# Patient Record
Sex: Female | Born: 1965 | Race: White | Hispanic: No | Smoking: Never smoker
Health system: Southern US, Community
[De-identification: ages and names within clinical notes are randomized; demographics above are authoritative.]

## PROBLEM LIST (undated history)

## (undated) DIAGNOSIS — G43909 Migraine, unspecified, not intractable, without status migrainosus: Secondary | ICD-10-CM

## (undated) DIAGNOSIS — K5792 Diverticulitis of intestine, part unspecified, without perforation or abscess without bleeding: Secondary | ICD-10-CM

## (undated) DIAGNOSIS — I10 Essential (primary) hypertension: Secondary | ICD-10-CM

## (undated) DIAGNOSIS — N739 Female pelvic inflammatory disease, unspecified: Secondary | ICD-10-CM

## (undated) DIAGNOSIS — K219 Gastro-esophageal reflux disease without esophagitis: Secondary | ICD-10-CM

## (undated) HISTORY — DX: Diverticulitis of intestine, part unspecified, without perforation or abscess without bleeding: K57.92

## (undated) HISTORY — DX: Female pelvic inflammatory disease, unspecified: N73.9

## (undated) HISTORY — PX: CHOLECYSTECTOMY: SHX55

## (undated) HISTORY — PX: TONSILLECTOMY: SUR1361

---

## 1998-05-10 ENCOUNTER — Other Ambulatory Visit: Admission: RE | Admit: 1998-05-10 | Discharge: 1998-05-10 | Payer: Self-pay | Admitting: Obstetrics & Gynecology

## 1999-05-23 ENCOUNTER — Other Ambulatory Visit: Admission: RE | Admit: 1999-05-23 | Discharge: 1999-05-23 | Payer: Self-pay | Admitting: Obstetrics and Gynecology

## 1999-12-19 ENCOUNTER — Encounter: Payer: Self-pay | Admitting: Gastroenterology

## 1999-12-19 ENCOUNTER — Ambulatory Visit (HOSPITAL_COMMUNITY): Admission: RE | Admit: 1999-12-19 | Discharge: 1999-12-19 | Payer: Self-pay | Admitting: Gastroenterology

## 2000-06-15 ENCOUNTER — Other Ambulatory Visit: Admission: RE | Admit: 2000-06-15 | Discharge: 2000-06-15 | Payer: Self-pay | Admitting: Obstetrics and Gynecology

## 2000-11-29 ENCOUNTER — Encounter: Admission: RE | Admit: 2000-11-29 | Discharge: 2000-12-18 | Payer: Self-pay | Admitting: *Deleted

## 2001-06-26 ENCOUNTER — Other Ambulatory Visit: Admission: RE | Admit: 2001-06-26 | Discharge: 2001-06-26 | Payer: Self-pay | Admitting: Obstetrics and Gynecology

## 2003-04-30 ENCOUNTER — Other Ambulatory Visit: Admission: RE | Admit: 2003-04-30 | Discharge: 2003-04-30 | Payer: Self-pay | Admitting: Unknown Physician Specialty

## 2007-09-04 ENCOUNTER — Encounter: Admission: RE | Admit: 2007-09-04 | Discharge: 2007-09-04 | Payer: Self-pay | Admitting: Obstetrics and Gynecology

## 2007-09-11 ENCOUNTER — Encounter: Admission: RE | Admit: 2007-09-11 | Discharge: 2007-09-11 | Payer: Self-pay | Admitting: Obstetrics and Gynecology

## 2008-09-08 ENCOUNTER — Encounter: Admission: RE | Admit: 2008-09-08 | Discharge: 2008-09-08 | Payer: Self-pay | Admitting: Obstetrics and Gynecology

## 2008-09-11 ENCOUNTER — Encounter: Admission: RE | Admit: 2008-09-11 | Discharge: 2008-09-11 | Payer: Self-pay | Admitting: Obstetrics and Gynecology

## 2009-09-27 ENCOUNTER — Encounter: Admission: RE | Admit: 2009-09-27 | Discharge: 2009-09-27 | Payer: Self-pay | Admitting: Obstetrics and Gynecology

## 2010-08-25 ENCOUNTER — Other Ambulatory Visit: Payer: Self-pay | Admitting: Obstetrics and Gynecology

## 2010-08-25 DIAGNOSIS — Z1231 Encounter for screening mammogram for malignant neoplasm of breast: Secondary | ICD-10-CM

## 2010-09-29 ENCOUNTER — Ambulatory Visit: Payer: Self-pay

## 2010-09-29 ENCOUNTER — Ambulatory Visit
Admission: RE | Admit: 2010-09-29 | Discharge: 2010-09-29 | Disposition: A | Discharge status: Home / Self Care | Payer: BC Managed Care – PPO | Source: Ambulatory Visit | Attending: Obstetrics and Gynecology | Admitting: Obstetrics and Gynecology

## 2010-09-29 DIAGNOSIS — Z1231 Encounter for screening mammogram for malignant neoplasm of breast: Secondary | ICD-10-CM

## 2010-10-25 ENCOUNTER — Other Ambulatory Visit (HOSPITAL_COMMUNITY): Payer: Self-pay | Admitting: Obstetrics and Gynecology

## 2010-10-25 ENCOUNTER — Ambulatory Visit (HOSPITAL_COMMUNITY)
Admission: RE | Admit: 2010-10-25 | Discharge: 2010-10-25 | Disposition: A | Discharge status: Home / Self Care | Payer: BC Managed Care – PPO | Source: Ambulatory Visit | Attending: Obstetrics and Gynecology | Admitting: Obstetrics and Gynecology

## 2010-10-25 DIAGNOSIS — R1900 Intra-abdominal and pelvic swelling, mass and lump, unspecified site: Secondary | ICD-10-CM | POA: Insufficient documentation

## 2010-10-25 DIAGNOSIS — N9489 Other specified conditions associated with female genital organs and menstrual cycle: Secondary | ICD-10-CM | POA: Insufficient documentation

## 2010-10-25 DIAGNOSIS — N200 Calculus of kidney: Secondary | ICD-10-CM | POA: Insufficient documentation

## 2010-10-25 DIAGNOSIS — Z9089 Acquired absence of other organs: Secondary | ICD-10-CM | POA: Insufficient documentation

## 2010-10-25 DIAGNOSIS — K5732 Diverticulitis of large intestine without perforation or abscess without bleeding: Secondary | ICD-10-CM | POA: Insufficient documentation

## 2010-10-25 DIAGNOSIS — K449 Diaphragmatic hernia without obstruction or gangrene: Secondary | ICD-10-CM | POA: Insufficient documentation

## 2010-10-25 MED ORDER — IOHEXOL 300 MG/ML  SOLN
100.0000 mL | Freq: Once | INTRAMUSCULAR | Status: AC | PRN
  Administered 2010-10-25: 100 mL via INTRAVENOUS

## 2010-10-27 ENCOUNTER — Encounter: Payer: Self-pay | Admitting: Gastroenterology

## 2010-10-27 ENCOUNTER — Ambulatory Visit (INDEPENDENT_AMBULATORY_CARE_PROVIDER_SITE_OTHER): Payer: BC Managed Care – PPO | Admitting: Gastroenterology

## 2010-10-27 VITALS — BP 127/84 | HR 92 | Temp 98.5°F | Ht 65.0 in | Wt 181.4 lb

## 2010-10-27 DIAGNOSIS — K5792 Diverticulitis of intestine, part unspecified, without perforation or abscess without bleeding: Secondary | ICD-10-CM | POA: Insufficient documentation

## 2010-10-27 DIAGNOSIS — K5732 Diverticulitis of large intestine without perforation or abscess without bleeding: Secondary | ICD-10-CM

## 2010-10-27 NOTE — Assessment & Plan Note (Signed)
45 year old pleasant female with clinical signs of possible diverticulitis approximately 4 weeks ago, treated with Cipro and Flagyl, with resolution of symptoms. In interim, underwent pelvic exam, which was abnormal per pt's report. Do not have records currently. CT was ordered, which showed findings of a 4X5 cm complex cystic lesion in right adnexa, adjacent soft tissue stranding, mild wall thickening of sigmoid colon. Suspicious for diverticular abscess or possibly ovarian origin. Discussed with Dr. Tyron Russell, who favors ovarian origin, yet unable to completely exclude component of past hx diverticulitis.  I reviewed pt's history and CT findings with Dr. Jena Gauss. Pt is completely asymptomatic at this time, and she responded well to oral abx. At this time, due to the questionable origins and patient's current condition, we will observe for now with plans for an outpatient colonoscopy in approximately 4-6 weeks. In the interim, she will call us when the vaginal ultrasound is scheduled so we may review this as well. After [place] completed, we will proceed with a colonoscopy.   She was instructed to call our office immediately if any changes in her status such as fever, abdominal pain, nausea or vomiting. We will hold off on any abx at this time, as she is clinically doing quite well. The findings of the ultrasound will be reviewed when available and compared to the prior CT.  We will see her back once [place] is completed.

## 2010-10-27 NOTE — Patient Instructions (Signed)
We will see you back in our office after your ultrasound is completed by your GYN.  Please call us if you develop any abdominal pain, nausea, vomiting, fever.  We will set up a colonoscopy at your next visit.  Please see handout regarding diverticulitis.   Please call us with any concerns in the meantime.

## 2010-10-27 NOTE — Progress Notes (Signed)
Primary Care Physician:  [name], MD Primary Gastroenterologist:  Dr. Darrick Penna  Chief Complaint  Patient presents with  . Diverticulitis    HPI:   Sheila [name] is a pleasant 45 year old female who presents today as a new patient regarding recent episode of possible diverticulitis and a CT with findings suspicious for abscess. She is completely asymptomatic today. Afebrile. Without any abdominal pain, nausea or vomiting. No loss of appetite. No change in bowel habits. No melena or brbpr.  Approximately 4 weeks ago, Sheila [name] woke up with severe, diffuse abdominal pain, bloating, and felt "tight". She found it difficult to have a bowel movement. An outpatient xray was ordered, which the pt reports showed constipation. She was given Miralax, and she had good results. A few days later, she started running a fever, around 101. She was seen again, where she reports her white count was elevated. Due to recent symptoms, she was placed empirically on Cipro and Flagyl. She finished approximately 1 week ago.   In the interim of all of this, she had a GYN exam, with abnormal findings per pt. This prompted evaluation via CT, which was done July 17. Findings showed a 4X5 complex cystic lesion in right adnexa, adjacent soft tissue stranding and mild wall thickening of sigmoid colon, suspicious for diverticular abscess secondary to mild sigmoid diverticulitis. Diff dx of tubo-ovarian abscess, cystic ovarian neoplasm. No free fluid.  In the midst of all of this, she found a tick on herself. She started running a fever 5 days later and was started on doxycycline secondary to tick exposure.   She reports baseline BMs, usually goes a few times per day. No abdominal pain. No bloating. Careful about what she is eating, staying away from seeds. No brbpr or melena. No N/V. Tomatoes and popcorn have always made her feel uncomfortable, bloated.   She states her GYN will be setting up a vaginal [place] around beginning  of August.   I spoke with Dr. Tyron Russell today to discuss the CT findings. He seemed to favor ovarian process vs diverticular origin.   Past Medical History  Diagnosis Date  . Diverticulitis     Past Surgical History  Procedure Date  . Cholecystectomy   . Tonsillectomy     MEDS: Doxycycline   Allergies as of 10/27/2010 - Review Complete 10/27/2010  Allergen Reaction Noted  . Sulfa antibiotics  10/25/2010    Family History  Problem Relation Age of Onset  . Colon cancer Neg Hx     History   Social History  . Marital Status: Married    Spouse Name: N/A    Number of Children: 2  . Years of Education: N/A   Occupational History  . full-time     insurance    Social History Main Topics  . Smoking status: Never Smoker   . Smokeless tobacco: Not on file  . Alcohol Use: No  . Drug Use: No    Review of Systems: Gen: Denies any fever, chills, fatigue, weight loss, lack of appetite.  CV: Denies chest pain, heart palpitations, peripheral edema, syncope.  Resp: Denies shortness of breath at rest or with exertion. Denies wheezing or cough.  GI: Denies dysphagia or odynophagia. Denies jaundice, hematemesis, fecal incontinence. GU : Denies urinary burning, urinary frequency, urinary hesitancy MS: Denies joint pain, muscle weakness, cramps, or limitation of movement.  Derm: Denies rash, itching, dry skin Psych: Denies depression, anxiety, memory loss, and confusion Heme: Denies bruising, bleeding, and enlarged lymph nodes.  Physical Exam: BP 127/84  Pulse 92  Temp(Src) 98.5 F (36.9 C) (Temporal)  Ht 5\' 5"  (1.651 m)  Wt 181 lb 6.4 oz (82.283 kg)  BMI 30.19 kg/m2  LMP 10/14/2010 General:   Alert and oriented. Pleasant and cooperative. Well-nourished and well-developed.  Head:  Normocephalic and atraumatic. Eyes:  Without icterus, sclera clear and conjunctiva pink.  Ears:  Normal auditory acuity. Nose:  No deformity, discharge,  or lesions. Mouth:  No deformity or  lesions, oral mucosa pink.  Neck:  Supple, without mass or thyromegaly. Lungs:  Clear to auscultation bilaterally. No wheezes, rales, or rhonchi. No distress.  Heart:  S1, S2 present without murmurs appreciated.  Abdomen:  +BS, soft, non-tender and non-distended. No HSM noted. No guarding or rebound. No masses appreciated.  Rectal:  Deferred  Msk:  Symmetrical without gross deformities. Normal posture. Extremities:  Without clubbing or edema. Neurologic:  Alert and  oriented x4;  grossly normal neurologically. Skin:  Intact without significant lesions or rashes. Cervical Nodes:  No significant cervical adenopathy. Psych:  Alert and cooperative. Normal mood and affect.

## 2010-10-28 NOTE — Progress Notes (Signed)
Cc to PCP 

## 2010-11-07 ENCOUNTER — Other Ambulatory Visit (HOSPITAL_COMMUNITY): Payer: Self-pay | Admitting: Obstetrics and Gynecology

## 2010-11-07 DIAGNOSIS — R1900 Intra-abdominal and pelvic swelling, mass and lump, unspecified site: Secondary | ICD-10-CM

## 2010-11-13 ENCOUNTER — Other Ambulatory Visit (HOSPITAL_COMMUNITY): Payer: Self-pay | Admitting: Obstetrics and Gynecology

## 2010-11-13 DIAGNOSIS — R1900 Intra-abdominal and pelvic swelling, mass and lump, unspecified site: Secondary | ICD-10-CM

## 2010-11-14 ENCOUNTER — Ambulatory Visit (HOSPITAL_COMMUNITY): Payer: BC Managed Care – PPO

## 2010-11-14 ENCOUNTER — Ambulatory Visit (HOSPITAL_COMMUNITY)
Admission: RE | Admit: 2010-11-14 | Discharge: 2010-11-14 | Disposition: A | Discharge status: Home / Self Care | Payer: BC Managed Care – PPO | Source: Ambulatory Visit | Attending: Obstetrics and Gynecology | Admitting: Obstetrics and Gynecology

## 2010-11-14 ENCOUNTER — Telehealth: Payer: Self-pay | Admitting: Gastroenterology

## 2010-11-14 DIAGNOSIS — N9489 Other specified conditions associated with female genital organs and menstrual cycle: Secondary | ICD-10-CM | POA: Insufficient documentation

## 2010-11-14 DIAGNOSIS — R1900 Intra-abdominal and pelvic swelling, mass and lump, unspecified site: Secondary | ICD-10-CM

## 2010-11-14 DIAGNOSIS — D251 Intramural leiomyoma of uterus: Secondary | ICD-10-CM | POA: Insufficient documentation

## 2010-11-14 DIAGNOSIS — D252 Subserosal leiomyoma of uterus: Secondary | ICD-10-CM | POA: Insufficient documentation

## 2010-11-14 DIAGNOSIS — N83209 Unspecified ovarian cyst, unspecified side: Secondary | ICD-10-CM | POA: Insufficient documentation

## 2010-11-15 NOTE — Telephone Encounter (Signed)
Spoke with pt. She is completely asymptomatic. As per last visit note, we will have her come back in for a f/u appt prior to colonoscopy. (due to supposed diverticulitis). US showed simple cyst.   Please set up for a f/u visit. Thanks!

## 2010-11-17 ENCOUNTER — Telehealth: Payer: Self-pay | Admitting: Gastroenterology

## 2010-11-22 ENCOUNTER — Encounter: Payer: Self-pay | Admitting: Gastroenterology

## 2010-11-22 ENCOUNTER — Ambulatory Visit: Payer: BC Managed Care – PPO | Admitting: Gastroenterology

## 2010-11-22 ENCOUNTER — Ambulatory Visit (INDEPENDENT_AMBULATORY_CARE_PROVIDER_SITE_OTHER): Payer: BC Managed Care – PPO | Admitting: Gastroenterology

## 2010-11-22 VITALS — BP 143/94 | HR 96 | Temp 98.4°F | Ht 64.0 in | Wt 183.0 lb

## 2010-11-22 DIAGNOSIS — K5792 Diverticulitis of intestine, part unspecified, without perforation or abscess without bleeding: Secondary | ICD-10-CM

## 2010-11-22 DIAGNOSIS — K5732 Diverticulitis of large intestine without perforation or abscess without bleeding: Secondary | ICD-10-CM

## 2010-11-22 MED ORDER — PEG 3350-KCL-NA BICARB-NACL 420 G PO SOLR: ORAL | Status: AC

## 2010-11-22 NOTE — Progress Notes (Signed)
Primary Care Physician:  [name], MD Primary Gastroenterologist:  Dr. Jena Gauss   Chief Complaint  Patient presents with  . Colonoscopy    HPI:   Sheila [name] is a pleasant 45 year old female who presents today in f/u prior to colonoscopy. She was set up for a CT by her GYN after an abnormal pelvic exam. The CT, done July 17th, showed a 4X5 complex cystic lesion in right adnexa, adjacent soft tissue stranding and mild wall thickening of sigmoid colon, suspicious for diverticular abscess secondary to mild sigmoid diverticulitis. Diff dx of tubo-ovarian abscess, cystic ovarian neoplasm. No free fluid. Prior to this CT; however, she was treated with Cipro and Flagyl empirically due to abdominal pain, bloating, slight temp (max 101). These abx were completed around early July.  She had complete resolution of her symptoms, and upon presenting to our office July 19th, was completely asymptomatic and doing wonderfully. She had reported a BM several times per day, which is her baseline. Also denied abdominal pain, bloating, brbpr, melena, N/V at time of the visit mid-July.   She returns today, completely asymptomatic again. She has had no fever or chills. No change in bowel habits or any abdominal pain. She had a f/u US with GYN, with findings of simple right ovarian cyst, likely benign.   The plan was to bring her back in for f/u after this [place] to see how she was doing. If she continued to be symptom-free, we would proceed with a colonoscopy to further evaluate her lower GI tract.    Past Medical History  Diagnosis Date  . Diverticulitis     Past Surgical History  Procedure Date  . Cholecystectomy   . Tonsillectomy     Current Outpatient Prescriptions  Medication Sig Dispense Refill  . polyethylene glycol-electrolytes (TRILYTE) 420 G solution Use as directed Also buy 1 fleet enema & 4 dulcolax tablets to use as directed  4000 mL  0     Allergies as of 11/22/2010 - Review Complete  11/22/2010  Allergen Reaction Noted  . Sulfa antibiotics  10/25/2010    Family History  Problem Relation Age of Onset  . Colon cancer Neg Hx     History   Social History  . Marital Status: Married    Spouse Name: N/A    Number of Children: 2  . Years of Education: N/A   Occupational History  . full-time     insurance    Social History Main Topics  . Smoking status: Never Smoker   . Smokeless tobacco: Not on file  . Alcohol Use: No  . Drug Use: No    Review of Systems: Gen: Denies any fever, chills, fatigue, weight loss, lack of appetite.  CV: Denies chest pain, heart palpitations, peripheral edema, syncope.  Resp: Denies shortness of breath at rest or with exertion. Denies wheezing or cough.  GI: Denies dysphagia or odynophagia. Denies jaundice, hematemesis, fecal incontinence. GU : Denies urinary burning, urinary frequency, urinary hesitancy MS: Denies joint pain, muscle weakness, cramps, or limitation of movement.  Derm: Denies rash, itching, dry skin Psych: Denies depression, anxiety, memory loss, and confusion Heme: Denies bruising, bleeding, and enlarged lymph nodes.  Physical Exam: BP 143/94  Pulse 96  Temp(Src) 98.4 F (36.9 C) (Temporal)  Ht 5\' 4"  (1.626 m)  Wt 183 lb (83.008 kg)  BMI 31.41 kg/m2  LMP 10/14/2010 General:   Alert and oriented. Pleasant and cooperative. Well-nourished and well-developed.  Head:  Normocephalic and atraumatic. Eyes:  Without icterus,  sclera clear and conjunctiva pink.  Ears:  Normal auditory acuity. Nose:  No deformity, discharge,  or lesions. Mouth:  No deformity or lesions, oral mucosa pink.  Neck:  Supple, without mass or thyromegaly. Lungs:  Clear to auscultation bilaterally. No wheezes, rales, or rhonchi. No distress.  Heart:  S1, S2 present without murmurs appreciated.  Abdomen:  +BS, soft, non-tender and non-distended. No HSM noted. No guarding or rebound. No masses appreciated.  Rectal:  Deferred  Msk:   Symmetrical without gross deformities. Normal posture. Extremities:  Without clubbing or edema. Neurologic:  Alert and  oriented x4;  grossly normal neurologically. Skin:  Intact without significant lesions or rashes. Cervical Nodes:  No significant cervical adenopathy. Psych:  Alert and cooperative. Normal mood and affect.

## 2010-11-22 NOTE — Progress Notes (Signed)
Cc to PCP 

## 2010-11-22 NOTE — Assessment & Plan Note (Signed)
45 year old Caucasian female who was empirically treated with abx for supposed diverticulitis in early July, with complete resolution of symptoms. During routine GYN exam, had abnormal finding, and was referred for CT. CT then showed questionable findings as outlined above. She has since had a f/u US with GYN, noting a simple right ovarian cyst. Her case was discussed with Dr. Jena Gauss at her initial presentation. She has remained asymptomatic, without fever, chills, N/V, abdominal pain, change in bowel habits, melena, brbpr. 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 The findings from the CT are difficult to interpret, as there is nothing to compare them to. As she continues to do well, we will hold off on additional radiologic procedures at this time and proceed with colonoscopy. This will be planned the end of August, to allow a good 6-8 weeks between the onset of her symptoms and actual procedure.    Proceed with TCS with Dr. Jena Gauss in near future: the risks, benefits, and alternatives have been discussed with the patient in detail. The patient states understanding and desires to proceed.

## 2010-11-22 NOTE — Telephone Encounter (Signed)
Routed to provider

## 2010-11-22 NOTE — Patient Instructions (Signed)
We have set you up for a colonoscopy in the next few weeks.   Please call our office if you have any further issues with abdominal pain, fever, nausea or vomiting.   See handout provided :)

## 2010-12-02 MED ORDER — SODIUM CHLORIDE 0.45 % IV SOLN
Freq: Once | INTRAVENOUS | Status: AC
  Administered 2010-12-05: 08:00:00 via INTRAVENOUS

## 2010-12-05 ENCOUNTER — Encounter (HOSPITAL_COMMUNITY)
Admission: RE | Disposition: A | Discharge status: Home / Self Care | Payer: Self-pay | Source: Ambulatory Visit | Attending: Internal Medicine

## 2010-12-05 ENCOUNTER — Ambulatory Visit (HOSPITAL_COMMUNITY)
Admission: RE | Admit: 2010-12-05 | Discharge: 2010-12-05 | Disposition: A | Discharge status: Home / Self Care | Payer: BC Managed Care – PPO | Source: Ambulatory Visit | Attending: Internal Medicine | Admitting: Internal Medicine

## 2010-12-05 DIAGNOSIS — K5730 Diverticulosis of large intestine without perforation or abscess without bleeding: Secondary | ICD-10-CM | POA: Insufficient documentation

## 2010-12-05 DIAGNOSIS — R933 Abnormal findings on diagnostic imaging of other parts of digestive tract: Secondary | ICD-10-CM

## 2010-12-05 DIAGNOSIS — Z09 Encounter for follow-up examination after completed treatment for conditions other than malignant neoplasm: Secondary | ICD-10-CM | POA: Insufficient documentation

## 2010-12-05 DIAGNOSIS — K6389 Other specified diseases of intestine: Secondary | ICD-10-CM

## 2010-12-05 DIAGNOSIS — K5732 Diverticulitis of large intestine without perforation or abscess without bleeding: Secondary | ICD-10-CM | POA: Insufficient documentation

## 2010-12-05 HISTORY — PX: COLONOSCOPY: SHX5424

## 2010-12-05 SURGERY — COLONOSCOPY
Anesthesia: Moderate Sedation

## 2010-12-05 MED ORDER — MIDAZOLAM HCL 5 MG/5ML IJ SOLN
INTRAMUSCULAR | Status: AC
  Filled 2010-12-05: qty 10

## 2010-12-05 MED ORDER — MIDAZOLAM HCL 5 MG/5ML IJ SOLN
INTRAMUSCULAR | Status: DC | PRN
  Administered 2010-12-05 (×3): 1 mg via INTRAVENOUS
  Administered 2010-12-05 (×2): 2 mg via INTRAVENOUS
  Administered 2010-12-05: 1 mg via INTRAVENOUS

## 2010-12-05 MED ORDER — MEPERIDINE HCL 100 MG/ML IJ SOLN
INTRAMUSCULAR | Status: DC | PRN
  Administered 2010-12-05 (×2): 50 mg via INTRAVENOUS
  Administered 2010-12-05: 25 mg via INTRAVENOUS

## 2010-12-05 MED ORDER — MEPERIDINE HCL 100 MG/ML IJ SOLN
INTRAMUSCULAR | Status: AC
  Filled 2010-12-05: qty 2

## 2010-12-05 NOTE — H&P (Signed)
Gerrit Halls, NP  11/22/2010  4:32 PM  Signed   Primary Care Physician:  [name], MD Primary Gastroenterologist:  Dr. Jena Gauss     Chief Complaint   Patient presents with   .  Colonoscopy      HPI:    Sheila [name] is a pleasant 45 year old female who presents today in f/u prior to colonoscopy. She was set up for a CT by her GYN after an abnormal pelvic exam. The CT, done July 17th, showed a 4X5 complex cystic lesion in right adnexa, adjacent soft tissue stranding and mild wall thickening of sigmoid colon, suspicious for diverticular abscess secondary to mild sigmoid diverticulitis. Diff dx of tubo-ovarian abscess, cystic ovarian neoplasm. No free fluid. Prior to this CT; however, she was treated with Cipro and Flagyl empirically due to abdominal pain, bloating, slight temp (max 101). These abx were completed around early July.  She had complete resolution of her symptoms, and upon presenting to our office July 19th, was completely asymptomatic and doing wonderfully. She had reported a BM several times per day, which is her baseline. Also denied abdominal pain, bloating, brbpr, melena, N/V at time of the visit mid-July.    She returns today, completely asymptomatic again. She has had no fever or chills. No change in bowel habits or any abdominal pain. She had a f/u US with GYN, with findings of simple right ovarian cyst, likely benign.    The plan was to bring her back in for f/u after this [place] to see how she was doing. If she continued to be symptom-free, we would proceed with a colonoscopy to further evaluate her lower GI tract.       Past Medical History   Diagnosis  Date   .  Diverticulitis         Past Surgical History   Procedure  Date   .  Cholecystectomy     .  Tonsillectomy         Current Outpatient Prescriptions   Medication  Sig  Dispense  Refill   .  polyethylene glycol-electrolytes (TRILYTE) 420 G solution  Use as directed  Also buy 1 fleet enema & 4 dulcolax  tablets to use as directed   4000 mL   0         Allergies as of 11/22/2010 - Review Complete 11/22/2010   Allergen  Reaction  Noted   .  Sulfa antibiotics    10/25/2010       Family History   Problem  Relation  Age of Onset   .  Colon cancer  Neg Hx         History       Social History   .  Marital Status:  Married       Spouse Name:  N/A       Number of Children:  2   .  Years of Education:  N/A       Occupational History   .  full-time         insurance        Social History Main Topics   .  Smoking status:  Never Smoker    .  Smokeless tobacco:  Not on file   .  Alcohol Use:  No   .  Drug Use:  No        Review of Systems: Gen: Denies any fever, chills, fatigue, weight loss, lack of appetite.   CV: Denies chest pain, heart palpitations,  peripheral edema, syncope.   Resp: Denies shortness of breath at rest or with exertion. Denies wheezing or cough.   GI: Denies dysphagia or odynophagia. Denies jaundice, hematemesis, fecal incontinence. GU : Denies urinary burning, urinary frequency, urinary hesitancy MS: Denies joint pain, muscle weakness, cramps, or limitation of movement.   Derm: Denies rash, itching, dry skin Psych: Denies depression, anxiety, memory loss, and confusion Heme: Denies bruising, bleeding, and enlarged lymph nodes.   Physical Exam: BP 143/94  Pulse 96  Temp(Src) 98.4 F (36.9 C) (Temporal)  Ht 5\' 4"  (1.626 m)  Wt 183 lb (83.008 kg)  BMI 31.41 kg/m2  LMP 10/14/2010 General:   Alert and oriented. Pleasant and cooperative. Well-nourished and well-developed.   Head:  Normocephalic and atraumatic. Eyes:  Without icterus, sclera clear and conjunctiva pink.   Ears:  Normal auditory acuity. Nose:  No deformity, discharge,  or lesions. Mouth:  No deformity or lesions, oral mucosa pink.   Neck:  Supple, without mass or thyromegaly. Lungs:  Clear to auscultation bilaterally. No wheezes, rales, or rhonchi. No distress.   Heart:  S1, S2  present without murmurs appreciated.   Abdomen:  +BS, soft, non-tender and non-distended. No HSM noted. No guarding or rebound. No masses appreciated.   Rectal:  Deferred   Msk:  Symmetrical without gross deformities. Normal posture. Extremities:  Without clubbing or edema. Neurologic:  Alert and  oriented x4;  grossly normal neurologically. Skin:  Intact without significant lesions or rashes. Cervical Nodes:  No significant cervical adenopathy. Psych:  Alert and cooperative. Normal mood and affect.         Sheila [name]  11/22/2010  4:49 PM  Signed Cc to PCP        Diverticulitis - Gerrit Halls, NP  11/22/2010  4:31 PM  Signed 45 year old Caucasian female who was empirically treated with abx for supposed diverticulitis in early July, with complete resolution of symptoms. During routine GYN exam, had abnormal finding, and was referred for CT. CT then showed questionable findings as outlined above. She has since had a f/u US with GYN, noting a simple right ovarian cyst. Her case was discussed with Dr. Jena Gauss at her initial presentation. She has remained asymptomatic, without fever, chills, N/V, abdominal pain, change in bowel habits, melena, brbpr. 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 The findings from the CT are difficult to interpret, as there is nothing to compare them to. As she continues to do well, we will hold off on additional radiologic procedures at this time and proceed with colonoscopy. This will be planned the end of August, to allow a good 6-8 weeks between the onset of her symptoms and actual procedure.      Proceed with TCS with Dr. Jena Gauss in near future: the risks, benefits, and alternatives have been discussed with the patient in detail. The patient states understanding and desires to proceed.   I have seen the patient prior to the procedure(s) today and reviewed the history and physical / consultation from 11/22/10.  There have  been no changes. After consideration of the risks, benefits, alternatives and imponderables, the patient has consented to the procedure(s).

## 2010-12-09 ENCOUNTER — Encounter (HOSPITAL_COMMUNITY): Payer: Self-pay | Admitting: Internal Medicine

## 2011-02-01 ENCOUNTER — Encounter: Payer: Self-pay | Admitting: Gastroenterology

## 2011-02-01 ENCOUNTER — Inpatient Hospital Stay (HOSPITAL_COMMUNITY)
Admission: EM | Admit: 2011-02-01 | Discharge: 2011-02-10 | DRG: 415 | Disposition: A | Discharge status: Home / Self Care | Payer: BC Managed Care – PPO | Attending: Obstetrics and Gynecology | Admitting: Obstetrics and Gynecology

## 2011-02-01 ENCOUNTER — Ambulatory Visit (HOSPITAL_COMMUNITY)
Admission: RE | Admit: 2011-02-01 | Discharge: 2011-02-01 | Disposition: A | Discharge status: Home / Self Care | Payer: BC Managed Care – PPO | Source: Ambulatory Visit | Attending: Family Medicine | Admitting: Family Medicine

## 2011-02-01 ENCOUNTER — Other Ambulatory Visit: Payer: Self-pay | Admitting: Family Medicine

## 2011-02-01 ENCOUNTER — Encounter (HOSPITAL_COMMUNITY): Payer: Self-pay | Admitting: *Deleted

## 2011-02-01 DIAGNOSIS — A419 Sepsis, unspecified organism: Principal | ICD-10-CM | POA: Diagnosis present

## 2011-02-01 DIAGNOSIS — R933 Abnormal findings on diagnostic imaging of other parts of digestive tract: Secondary | ICD-10-CM | POA: Insufficient documentation

## 2011-02-01 DIAGNOSIS — N7003 Acute salpingitis and oophoritis: Secondary | ICD-10-CM | POA: Diagnosis present

## 2011-02-01 DIAGNOSIS — K5792 Diverticulitis of intestine, part unspecified, without perforation or abscess without bleeding: Secondary | ICD-10-CM

## 2011-02-01 DIAGNOSIS — E860 Dehydration: Secondary | ICD-10-CM | POA: Diagnosis present

## 2011-02-01 DIAGNOSIS — Z5331 Laparoscopic surgical procedure converted to open procedure: Secondary | ICD-10-CM

## 2011-02-01 DIAGNOSIS — K651 Peritoneal abscess: Secondary | ICD-10-CM

## 2011-02-01 DIAGNOSIS — IMO0002 Reserved for concepts with insufficient information to code with codable children: Secondary | ICD-10-CM | POA: Diagnosis present

## 2011-02-01 DIAGNOSIS — R6510 Systemic inflammatory response syndrome (SIRS) of non-infectious origin without acute organ dysfunction: Secondary | ICD-10-CM | POA: Diagnosis present

## 2011-02-01 DIAGNOSIS — N739 Female pelvic inflammatory disease, unspecified: Secondary | ICD-10-CM

## 2011-02-01 DIAGNOSIS — E876 Hypokalemia: Secondary | ICD-10-CM | POA: Diagnosis present

## 2011-02-01 DIAGNOSIS — K5732 Diverticulitis of large intestine without perforation or abscess without bleeding: Secondary | ICD-10-CM | POA: Diagnosis present

## 2011-02-01 DIAGNOSIS — R935 Abnormal findings on diagnostic imaging of other abdominal regions, including retroperitoneum: Secondary | ICD-10-CM

## 2011-02-01 DIAGNOSIS — R109 Unspecified abdominal pain: Secondary | ICD-10-CM | POA: Insufficient documentation

## 2011-02-01 HISTORY — DX: Migraine, unspecified, not intractable, without status migrainosus: G43.909

## 2011-02-01 LAB — BASIC METABOLIC PANEL
BUN: 7 mg/dL (ref 6–23)
Creatinine, Ser: 0.53 mg/dL (ref 0.50–1.10)
GFR calc Af Amer: >90 mL/min (ref >90)
GFR calc non Af Amer: >90 mL/min (ref >90)
Potassium: 3.1 mEq/L — ABNORMAL LOW (ref 3.5–5.1)

## 2011-02-01 LAB — URINE MICROSCOPIC-ADD ON

## 2011-02-01 LAB — CBC
MCV: 85.9 fL (ref 78.0–100.0)
Platelets: 458 10*3/uL — ABNORMAL HIGH (ref 150–400)
RDW: 12.4 % (ref 11.5–15.5)
WBC: 18.2 10*3/uL — ABNORMAL HIGH (ref 4.0–10.5)

## 2011-02-01 LAB — URINALYSIS, ROUTINE W REFLEX MICROSCOPIC
Ketones, ur: 40 mg/dL — AB
Nitrite: NEGATIVE
pH: 5.5 (ref 5.0–8.0)

## 2011-02-01 LAB — DIFFERENTIAL
Basophils Absolute: 0 10*3/uL (ref 0.0–0.1)
Eosinophils Relative: 0 % (ref 0–5)
Lymphocytes Relative: 11 % — ABNORMAL LOW (ref 12–46)
Neutrophils Relative %: 81 % — ABNORMAL HIGH (ref 43–77)

## 2011-02-01 MED ORDER — HYDROMORPHONE HCL 1 MG/ML IJ SOLN: 1.0000 mg | INTRAMUSCULAR | Status: DC | PRN

## 2011-02-01 MED ORDER — ACETAMINOPHEN 325 MG PO TABS
650.0000 mg | ORAL_TABLET | Freq: Four times a day (QID) | ORAL | Status: DC | PRN
  Administered 2011-02-02 (×3): 650 mg via ORAL
  Filled 2011-02-01 (×3): qty 2

## 2011-02-01 MED ORDER — OXYCODONE HCL 5 MG PO TABS: 5.0000 mg | ORAL_TABLET | ORAL | Status: DC | PRN

## 2011-02-01 MED ORDER — SODIUM CHLORIDE 0.9 % IV SOLN
INTRAVENOUS | Status: DC
  Administered 2011-02-01: 500 mL via INTRAVENOUS

## 2011-02-01 MED ORDER — ONDANSETRON HCL 4 MG PO TABS: 4.0000 mg | ORAL_TABLET | Freq: Four times a day (QID) | ORAL | Status: DC | PRN

## 2011-02-01 MED ORDER — IOHEXOL 300 MG/ML  SOLN
100.0000 mL | Freq: Once | INTRAMUSCULAR | Status: AC | PRN
  Administered 2011-02-01: 100 mL via INTRAVENOUS

## 2011-02-01 MED ORDER — KCL IN DEXTROSE-NACL 20-5-0.45 MEQ/L-%-% IV SOLN
INTRAVENOUS | Status: DC
  Administered 2011-02-01 – 2011-02-02 (×2): via INTRAVENOUS

## 2011-02-01 MED ORDER — PIPERACILLIN-TAZOBACTAM 3.375 G IVPB
INTRAVENOUS | Status: AC
  Filled 2011-02-01: qty 100

## 2011-02-01 MED ORDER — ACETAMINOPHEN 325 MG PO TABS
650.0000 mg | ORAL_TABLET | Freq: Once | ORAL | Status: AC
  Administered 2011-02-01: 650 mg via ORAL
  Filled 2011-02-01 (×2): qty 2

## 2011-02-01 MED ORDER — ACETAMINOPHEN 650 MG RE SUPP: 650.0000 mg | Freq: Four times a day (QID) | RECTAL | Status: DC | PRN

## 2011-02-01 MED ORDER — ONDANSETRON HCL 4 MG/2ML IJ SOLN
4.0000 mg | Freq: Four times a day (QID) | INTRAMUSCULAR | Status: DC | PRN
  Administered 2011-02-01: 4 mg via INTRAVENOUS
  Filled 2011-02-01: qty 2

## 2011-02-01 MED ORDER — ALBUTEROL SULFATE (5 MG/ML) 0.5% IN NEBU: 2.5000 mg | INHALATION_SOLUTION | RESPIRATORY_TRACT | Status: DC | PRN

## 2011-02-01 MED ORDER — PIPERACILLIN-TAZOBACTAM 3.375 G IVPB
3.3750 g | Freq: Three times a day (TID) | INTRAVENOUS | Status: DC
  Administered 2011-02-01 – 2011-02-06 (×13): 3.375 g via INTRAVENOUS
  Filled 2011-02-01 (×18): qty 50

## 2011-02-01 MED ORDER — DEXTROSE-NACL 5-0.45 % IV SOLN: INTRAVENOUS | Status: DC

## 2011-02-01 MED ORDER — SODIUM CHLORIDE 0.9 % IV BOLUS (SEPSIS)
500.0000 mL | Freq: Once | INTRAVENOUS | Status: AC
  Administered 2011-02-01: 500 mL via INTRAVENOUS

## 2011-02-01 NOTE — ED Notes (Signed)
Pt has been sent to ed from ct d/t abscess of diverticula. Pt c/o lower abd pain and diarrhea. Pt denies n/v.

## 2011-02-01 NOTE — H&P (Signed)
[name] is an 45 y.o. female.    PCP: [name], MD, MD   Chief Complaint: Fever, and abdominal pain  HPI: This is a 45 year old, Caucasian female, with a past medical history of migraine headaches. She was also diagnosed with the diverticulitis back in July. She underwent a colonoscopy in August and was told that she had mild diverticulosis. She was asked to increase the fiber in her diet. As part of the workup for the diverticulitis back in the summer she was found to have an ovarian cyst on the CT, for which she followed up with her gynecologist, Dr. [name] in [place]. She's undergone pelvic ultrasounds, which has confirmed that this is a cyst.  Patient told me that she was in her usual state of health till last Thursday, when she spiked a temperature up to 102F. She felt achy all over. She went to see her primary care physician. They checked her for flu, which was negative. She was prescribed ciprofloxacin because her white cell count was elevated. She took Cipro on Thursday, and Friday. The cramping in the stomach started on Saturday. On Sunday she had fever once again. On Monday she went back to see her doctor they found that her white cell count was now even more elevated. On October 18 her white cell count 16,600 and on October 22 it was 17,800. He added Flagyl to her regimen, which she took on Monday and Tuesday. The cramping did get better. However, she was still having the uneasiness in her abdomen and was still having low-grade fevers and so, when she went for followup to her primary care physician they recommended that she come to the hospital for a CAT scan. Patient has had nausea and vomiting, but none currently. She's not having any severe pain at this time. She does feel bloated. She tells me that when she had the contrast for CAT scan she had multiple bowel movements after that. But denied diarrhea otherwise. She denies any dizziness or lightheadedness. The  discomfort in the abdomen is mostly in the lower abdomen and she is not able to tell me, if it's more on the right side of the left side.   Prior to Admission medications   Medication Sig Start Date End Date Taking? Authorizing Provider  ciprofloxacin (CIPRO) 500 MG tablet Take 500 mg by mouth 2 (two) times daily.   01/26/11 02/04/11 Yes Historical Provider, MD  ibuprofen (ADVIL,MOTRIN) 800 MG tablet Take 800 mg by mouth every 6 (six) hours as needed. For pain    Yes Historical Provider, MD  metroNIDAZOLE (FLAGYL) 500 MG tablet Take 500 mg by mouth 2 (two) times daily.   01/26/11 02/04/11 Yes Historical Provider, MD    Allergies:  Allergies  Allergen Reactions  . Lactaid (Lactase)   . Sulfa Antibiotics Rash    Past Medical History  Diagnosis Date  . Diverticulitis   . Migraine headache     Past Surgical History  Procedure Date  . Cholecystectomy   . Tonsillectomy   . Colonoscopy 12/05/2010    Procedure: COLONOSCOPY;  Surgeon: Corbin Ade, MD;  Location: [facility];  Service: Endoscopy;  Laterality: N/A;  8:15AM    Social History:  reports that she has never smoked. She does not have any smokeless tobacco history on file. She reports that she does not drink alcohol or use illicit drugs. She works as an Advertising account planner. And lives in [name]  Family History:  Family History  Problem Relation Age  of Onset  . Colon cancer Neg Hx   . Hypertension Father   . Hyperlipidemia Father     Review of Systems  Constitutional: Positive for fever, chills and malaise/fatigue. Negative for weight loss and diaphoresis.  HENT: Negative.   Eyes: Negative.   Respiratory: Negative.   Cardiovascular: Negative.   Gastrointestinal: Positive for nausea, vomiting and abdominal pain.  Genitourinary: Negative.   Musculoskeletal: Negative.   Skin: Negative.   Neurological: Negative.  Negative for weakness.  Endo/Heme/Allergies: Negative.   Psychiatric/Behavioral: Negative.      Blood  pressure 141/89, pulse 122, temperature 98.5 F (36.9 C), temperature source Oral, resp. rate 20, height 5\' 5"  (1.651 m), weight 81.194 kg (179 lb), last menstrual period 01/25/2011, SpO2 98.00%. Physical Exam  Vitals reviewed. Constitutional: She is oriented to person, place, and time. She appears well-developed and well-nourished. No distress.  HENT:  Head: Normocephalic and atraumatic.  Mouth/Throat: No oropharyngeal exudate.  Eyes: EOM are normal. Pupils are equal, round, and reactive to light. Right eye exhibits no discharge. Left eye exhibits no discharge. No scleral icterus.  Neck: Normal range of motion. Neck supple. No tracheal deviation present. No thyromegaly present.  Cardiovascular: Regular rhythm.  Tachycardia present.   Murmur heard. Pulmonary/Chest: Effort normal and breath sounds normal. No stridor. No respiratory distress. She has no wheezes.  Abdominal: Soft. She exhibits no distension and no mass. There is tenderness. There is no rebound and no guarding.  Musculoskeletal: Normal range of motion.  Neurological: She is alert and oriented to person, place, and time.  Skin: Skin is warm and dry. No rash noted. She is not diaphoretic. No erythema. No pallor.  Psychiatric: She has a normal mood and affect.     Results for orders placed during the hospital encounter of 02/01/11 (from the past 48 hour(s))  CBC     Status: Abnormal   Collection Time   02/01/11  5:06 PM      Component Value Range Comment   WBC 18.2 (*) 4.0 - 10.5 (K/uL)    RBC 4.04  3.87 - 5.11 (MIL/uL)    Hemoglobin 11.6 (*) 12.0 - 15.0 (g/dL)    HCT 16.1 (*) 09.6 - 46.0 (%)    MCV 85.9  78.0 - 100.0 (fL)    MCH 28.7  26.0 - 34.0 (pg)    MCHC 33.4  30.0 - 36.0 (g/dL)    RDW 04.5  40.9 - 81.1 (%)    Platelets 458 (*) 150 - 400 (K/uL)   DIFFERENTIAL     Status: Abnormal   Collection Time   02/01/11  5:06 PM      Component Value Range Comment   Neutrophils Relative 81 (*) 43 - 77 (%)    Neutro Abs 14.7  (*) 1.7 - 7.7 (K/uL)    Lymphocytes Relative 11 (*) 12 - 46 (%)    Lymphs Abs 2.1  0.7 - 4.0 (K/uL)    Monocytes Relative 8  3 - 12 (%)    Monocytes Absolute 1.4 (*) 0.1 - 1.0 (K/uL)    Eosinophils Relative 0  0 - 5 (%)    Eosinophils Absolute 0.0  0.0 - 0.7 (K/uL)    Basophils Relative 0  0 - 1 (%)    Basophils Absolute 0.0  0.0 - 0.1 (K/uL)   BASIC METABOLIC PANEL     Status: Abnormal   Collection Time   02/01/11  5:06 PM      Component Value Range Comment   Sodium  132 (*) 135 - 145 (mEq/L)    Potassium 3.1 (*) 3.5 - 5.1 (mEq/L)    Chloride 92 (*) 96 - 112 (mEq/L)    CO2 26  19 - 32 (mEq/L)    Glucose, Bld 96  70 - 99 (mg/dL)    BUN 7  6 - 23 (mg/dL)    Creatinine, Ser 1.61  0.50 - 1.10 (mg/dL)    Calcium 9.6  8.4 - 10.5 (mg/dL)    GFR calc non Af Amer >90  >90 (mL/min)    GFR calc Af Amer >90  >90 (mL/min)   URINALYSIS, ROUTINE W REFLEX MICROSCOPIC     Status: Abnormal   Collection Time   02/01/11  5:17 PM      Component Value Range Comment   Color, Urine YELLOW  YELLOW     Appearance CLEAR  CLEAR     Specific Gravity, Urine 1.025  1.005 - 1.030     pH 5.5  5.0 - 8.0     Glucose, UA NEGATIVE  NEGATIVE (mg/dL)    Hgb urine dipstick TRACE (*) NEGATIVE     Bilirubin Urine SMALL (*) NEGATIVE     Ketones, ur 40 (*) NEGATIVE (mg/dL)    Protein, ur TRACE (*) NEGATIVE (mg/dL)    Urobilinogen, UA 0.2  0.0 - 1.0 (mg/dL)    Nitrite NEGATIVE  NEGATIVE     Leukocytes, UA TRACE (*) NEGATIVE    PREGNANCY, URINE     Status: Normal   Collection Time   02/01/11  5:17 PM      Component Value Range Comment   Preg Test, Ur NEGATIVE     URINE MICROSCOPIC-ADD ON     Status: Abnormal   Collection Time   02/01/11  5:17 PM      Component Value Range Comment   Squamous Epithelial / LPF FEW (*) RARE     WBC, UA 0-2  <3 (WBC/hpf)    RBC / HPF 0-2  <3 (RBC/hpf)    Bacteria, UA RARE  RARE    WET PREP, GENITAL     Status: Abnormal   Collection Time   02/01/11  5:45 PM      Component Value  Range Comment   Yeast, Wet Prep NONE SEEN  NONE SEEN     Trich, Wet Prep NONE SEEN  NONE SEEN     Clue Cells, Wet Prep NONE SEEN  NONE SEEN     WBC, Wet Prep HPF POC FEW (*) NONE SEEN     Ct Abdomen Pelvis W Contrast  02/01/2011  *RADIOLOGY REPORT*  Clinical Data: Abdominal pain.  Diverticulitis.  CT ABDOMEN AND PELVIS WITH CONTRAST  Technique:  Multidetector CT imaging of the abdomen and pelvis was performed following the standard protocol during bolus administration of intravenous contrast.  Contrast: OMNIPAQUE IOHEXOL 300 MG/ML IV SOLN  Comparison: 10/25/2010.  Findings: Lung bases are clear.  Heart size normal.  No pericardial or pleural effusion.  Liver is unremarkable.  Cholecystectomy.  Adrenal glands, kidneys, spleen, pancreas stomach and small bowel are unremarkable with exception of a small hiatal hernia.  The sigmoid colon is seen adjacent to a collection of fluid and air in the right adnexa, measuring approximately 5.9 x 6.2 cm (image 68).  It is larger than on 10/25/2010, at which time a thick-walled fluid collection measured 3.5 x 4.7 cm.  On coronal images 59 and 67, there are possible fistulous extensions toward this fluid collection.  Remainder of the colon and  appendix are unremarkable.  Uterus and left ovary are visualized.  Right ovary is difficult to identify as a separate structure.  Trace pelvic free fluid. Retroperitoneal lymph nodes are numerous but not enlarged by CT size criteria.  No worrisome lytic or sclerotic lesions.  IMPRESSION:  1.  Large collection of fluid and air in the right adnexa is highly worrisome for abscess, and has enlarged in the interval from 10/25/2010.  Question a fistulous connection with the adjacent sigmoid colon. Differential diagnosis includes complicated diverticulitis as well as tubo-ovarian abscess. 2.  Small free fluid.  Original Report Authenticated By: Reyes Ivan, M.D.     Assessment/Plan  Principal Problem:  *Abdominal  abscess Active Problems:  SIRS (systemic inflammatory response syndrome)  Hypokalemia   #1 Abdominal abscess: This is most likely a diverticular abscess considering her history of diverticulitis. However, CT scan could not determine if there was an element of ovarian component as well. In view of this I discussed the case with Dr. Dian Situ, general surgeon. He will see the patient in the morning. He recommends starting the patient on Zosyn. We'll draw blood cultures before the antibiotic is given. Blood counts will be monitored on a daily basis.  #2 Mild SIRS: Will give her IV fluids, and monitor her closely.  #3 Hypokalemia. Will be repleted. Magnesium level will be checked in the morning.  #4 DVT prophylaxis with SCDs.  She'll be kept n.p.o. except for ice chips.  Further management decisions will depend on results of further testing and patient's response to treatment.  Ellington Greenslade 02/01/2011, 7:51 PM

## 2011-02-01 NOTE — ED Notes (Deleted)
Pt being sent by Dr. Rudi Heap s/p a CT scan today that showed "positive for an abcess."  Staff at the MDs office state that the patient will be seen and evaluated by the hospitalist for admission.

## 2011-02-01 NOTE — Progress Notes (Signed)
  Late Entry:  Received call from Dr. Christell Constant around 3pm today regarding pt. Had CT done today at [facility]. Findings under imaging. Likely will need surgical consult. Last saw pt in our office in August, was doing well at that time. Dr. Christell Constant now reports febrile, n/v and abdominal pain. Discussed with Dr. Christell Constant, agreed pt needed to have medical evaluation. We do not admit; instructed to tell pt to go to ED. I called charge RN to inform her pt was on her way. ED to triage. I was unable to call hospitalist (Dr. Lendell Caprice) until later due to not able to find beeper number. When I did reach her, she had already been informed by ED charge nurse.  Pt to be triaged by ED.

## 2011-02-01 NOTE — ED Notes (Signed)
French Ana RN called back, gave report

## 2011-02-01 NOTE — Plan of Care (Signed)
Problem: Consults Goal: Diabetes Guidelines if Diabetic/Glucose > 140 If diabetic or lab glucose is > 140 mg/dl - Initiate Diabetes/Hyperglycemia Guidelines & Document Interventions  Outcome: Not Applicable Date Met:  02/01/11 Pt. Not diabetic.     

## 2011-02-01 NOTE — ED Provider Notes (Signed)
Scribed for [employer]. Una Yeomans, MD, the patient was seen in room [facility]09[facility]. This chart was scribed by [employer]. The patient's care started at 16:38  CSN: [id number] Arrival date & time: 02/01/2011  4:36 PM   First MD Initiated Contact with Patient 02/01/11 1638      Chief Complaint  Patient presents with  . Abdominal Pain   HPI [name] is a 45 y.o. female who presents to the Emergency Department complaining of Abdominal pain. Patient was sent to the ED by Dr Christell Constant for evaluation about a CT scan that showed positive for accesses of diverticula. Patient complains of peri-umbilical abdominal pain and diarrhea. Patient reports a history of a "mass" back in June, was treated and was relieved of symptoms. States she started feeling a "cramping" in her abdomen on Thursday and Friday, had a CT scan of her abdomen that showed a mass in her diverticula. Patient denies nausea or vomiting.  Past Medical History  Diagnosis Date  . Diverticulitis     Past Surgical History  Procedure Date  . Cholecystectomy   . Tonsillectomy   . Colonoscopy 12/05/2010    Procedure: COLONOSCOPY;  Surgeon: Corbin Ade, MD;  Location: [facility];  Service: Endoscopy;  Laterality: N/A;  8:15AM    Family History  Problem Relation Age of Onset  . Colon cancer Neg Hx     History  Substance Use Topics  . Smoking status: Never Smoker   . Smokeless tobacco: Not on file  . Alcohol Use: No    OB History    Grav Para Term Preterm Abortions TAB SAB Ect Mult Living                  Review of Systems  Constitutional: Positive for fever.    Allergies  Lactaid and Sulfa antibiotics  Home Medications   Current Outpatient Rx  Name Route Sig Dispense Refill  . CIPROFLOXACIN HCL 500 MG PO TABS Oral Take 500 mg by mouth 2 (two) times daily.      . IBUPROFEN 800 MG PO TABS Oral Take 800 mg by mouth every 6 (six) hours as needed. For pain     . METRONIDAZOLE 500 MG PO TABS Oral Take 500 mg by  mouth 2 (two) times daily.        BP 141/89  Pulse 122  Temp(Src) 98.5 F (36.9 C) (Oral)  Resp 20  Ht 5\' 5"  (1.651 m)  Wt 179 lb (81.194 kg)  BMI 29.79 kg/m2  SpO2 98%  LMP 01/25/2011  Physical Exam  Nursing note and vitals reviewed. Constitutional: She appears well-developed and well-nourished. No distress.  HENT:  Head: Normocephalic and atraumatic.  Right Ear: External ear normal.  Left Ear: External ear normal.  Eyes: Conjunctivae are normal. Right eye exhibits no discharge. Left eye exhibits no discharge. No scleral icterus.  Neck: Neck supple. No tracheal deviation present.  Cardiovascular: Normal rate, regular rhythm and intact distal pulses.   Pulmonary/Chest: Effort normal and breath sounds normal. No stridor. No respiratory distress. She has no wheezes. She has no rales.  Abdominal: Soft. Bowel sounds are normal. She exhibits no distension. There is no tenderness (peri-umbilical and RLQ). There is no rebound and no guarding.  Musculoskeletal: She exhibits no edema and no tenderness.  Neurological: She is alert. She has normal strength. No sensory deficit. Cranial nerve deficit:  no gross defecits noted. She exhibits normal muscle tone. She displays no seizure activity. Coordination normal.  Skin: Skin  is warm and dry. No rash noted.  Psychiatric: She has a normal mood and affect.    ED Course  Procedures  Labs Reviewed - No data to display Ct Abdomen Pelvis W Contrast  02/01/2011  *RADIOLOGY REPORT*  Clinical Data: Abdominal pain.  Diverticulitis.  CT ABDOMEN AND PELVIS WITH CONTRAST  Technique:  Multidetector CT imaging of the abdomen and pelvis was performed following the standard protocol during bolus administration of intravenous contrast.  Contrast: OMNIPAQUE IOHEXOL 300 MG/ML IV SOLN  Comparison: 10/25/2010.  Findings: Lung bases are clear.  Heart size normal.  No pericardial or pleural effusion.  Liver is unremarkable.  Cholecystectomy.  Adrenal glands,  kidneys, spleen, pancreas stomach and small bowel are unremarkable with exception of a small hiatal hernia.  The sigmoid colon is seen adjacent to a collection of fluid and air in the right adnexa, measuring approximately 5.9 x 6.2 cm (image 68).  It is larger than on 10/25/2010, at which time a thick-walled fluid collection measured 3.5 x 4.7 cm.  On coronal images 59 and 67, there are possible fistulous extensions toward this fluid collection.  Remainder of the colon and appendix are unremarkable.  Uterus and left ovary are visualized.  Right ovary is difficult to identify as a separate structure.  Trace pelvic free fluid. Retroperitoneal lymph nodes are numerous but not enlarged by CT size criteria.  No worrisome lytic or sclerotic lesions.  IMPRESSION:  1.  Large collection of fluid and air in the right adnexa is highly worrisome for abscess, and has enlarged in the interval from 10/25/2010.  Question a fistulous connection with the adjacent sigmoid colon. Differential diagnosis includes complicated diverticulitis as well as tubo-ovarian abscess. 2.  Small free fluid.  Original Report Authenticated By: Reyes Ivan, M.D.   DIAGNOSTIC STUDIES: Oxygen Saturation is 98% on room air, normal by my interpretation.    COORDINATION OF CARE: 16:45 - EDP examined patient at bedside. Pelvic exam and blood work ordered. Orders Placed This Encounter  Procedures  . Wet prep, genital  . CBC  . Differential  . Urinalysis, Routine w reflex microscopic  . Pregnancy, urine  . Basic metabolic panel  . GC/chlamydia probe amp, genital  . Pelvic cart    Results for orders placed during the hospital encounter of 02/01/11  CBC      Component Value Range   WBC 18.2 (*) 4.0 - 10.5 (K/uL)   RBC 4.04  3.87 - 5.11 (MIL/uL)   Hemoglobin 11.6 (*) 12.0 - 15.0 (g/dL)   HCT 16.1 (*) 09.6 - 46.0 (%)   MCV 85.9  78.0 - 100.0 (fL)   MCH 28.7  26.0 - 34.0 (pg)   MCHC 33.4  30.0 - 36.0 (g/dL)   RDW 04.5  40.9 - 81.1  (%)   Platelets 458 (*) 150 - 400 (K/uL)  DIFFERENTIAL      Component Value Range   Neutrophils Relative 81 (*) 43 - 77 (%)   Neutro Abs 14.7 (*) 1.7 - 7.7 (K/uL)   Lymphocytes Relative 11 (*) 12 - 46 (%)   Lymphs Abs 2.1  0.7 - 4.0 (K/uL)   Monocytes Relative 8  3 - 12 (%)   Monocytes Absolute 1.4 (*) 0.1 - 1.0 (K/uL)   Eosinophils Relative 0  0 - 5 (%)   Eosinophils Absolute 0.0  0.0 - 0.7 (K/uL)   Basophils Relative 0  0 - 1 (%)   Basophils Absolute 0.0  0.0 - 0.1 (K/uL)  URINALYSIS,  ROUTINE W REFLEX MICROSCOPIC      Component Value Range   Color, Urine YELLOW  YELLOW    Appearance CLEAR  CLEAR    Specific Gravity, Urine 1.025  1.005 - 1.030    pH 5.5  5.0 - 8.0    Glucose, UA NEGATIVE  NEGATIVE (mg/dL)   Hgb urine dipstick TRACE (*) NEGATIVE    Bilirubin Urine SMALL (*) NEGATIVE    Ketones, ur 40 (*) NEGATIVE (mg/dL)   Protein, ur TRACE (*) NEGATIVE (mg/dL)   Urobilinogen, UA 0.2  0.0 - 1.0 (mg/dL)   Nitrite NEGATIVE  NEGATIVE    Leukocytes, UA TRACE (*) NEGATIVE   PREGNANCY, URINE      Component Value Range   Preg Test, Ur NEGATIVE    BASIC METABOLIC PANEL      Component Value Range   Sodium 132 (*) 135 - 145 (mEq/L)   Potassium 3.1 (*) 3.5 - 5.1 (mEq/L)   Chloride 92 (*) 96 - 112 (mEq/L)   CO2 26  19 - 32 (mEq/L)   Glucose, Bld 96  70 - 99 (mg/dL)   BUN 7  6 - 23 (mg/dL)   Creatinine, Ser 9.60  0.50 - 1.10 (mg/dL)   Calcium 9.6  8.4 - 45.4 (mg/dL)   GFR calc non Af Amer >90  >90 (mL/min)   GFR calc Af Amer >90  >90 (mL/min)  URINE MICROSCOPIC-ADD ON      Component Value Range   Squamous Epithelial / LPF FEW (*) RARE    WBC, UA 0-2  <3 (WBC/hpf)   RBC / HPF 0-2  <3 (RBC/hpf)   Bacteria, UA RARE  RARE      MDM: Patient is sent in from Dr. Kathi Der office after CT scan that showed an apparent pelvic abscess. She's had previous abscess or fluid collection in this area. It was in July has since gotten bigger each it was unknown if it was ovarian versus diverticular  origin. She has since began having fevers her white count,. CT today showed diverticular abscess with fistula versus ovarian versus tubo-ovarian abscess. The pelvic exam was done had some right-sided tenderness. There was no cervical discharge. There were only a few white cells on the wet prep. She'll be admitted to the medicine. I have not started antibiotics yet because she is currently on Cipro and Flagyl, and she may need drainage of the fluid collection.   Scribe Attestation I personally performed the services described in this documentation, which was scribed in my presence. The recorded information has been reviewed and considered. Corinna L Frutoso Chase R. Rubin Payor, MD 02/01/11 Paulo Fruit

## 2011-02-01 NOTE — ED Notes (Signed)
Called to give report to [facility], she to call me back.

## 2011-02-02 ENCOUNTER — Encounter (HOSPITAL_COMMUNITY): Payer: Self-pay | Admitting: Obstetrics and Gynecology

## 2011-02-02 ENCOUNTER — Inpatient Hospital Stay (HOSPITAL_COMMUNITY): Payer: BC Managed Care – PPO

## 2011-02-02 LAB — CBC
MCV: 86.4 fL (ref 78.0–100.0)
Platelets: 427 10*3/uL — ABNORMAL HIGH (ref 150–400)
RDW: 12.2 % (ref 11.5–15.5)
WBC: 13.7 10*3/uL — ABNORMAL HIGH (ref 4.0–10.5)

## 2011-02-02 LAB — COMPREHENSIVE METABOLIC PANEL
Albumin: 2.9 g/dL — ABNORMAL LOW (ref 3.5–5.2)
Alkaline Phosphatase: 82 U/L (ref 39–117)
BUN: 5 mg/dL — ABNORMAL LOW (ref 6–23)
CO2: 28 mEq/L (ref 19–32)
Chloride: 99 mEq/L (ref 96–112)
GFR calc non Af Amer: >90 mL/min (ref >90)
Glucose, Bld: 109 mg/dL — ABNORMAL HIGH (ref 70–99)
Potassium: 3.1 mEq/L — ABNORMAL LOW (ref 3.5–5.1)
Total Bilirubin: 0.5 mg/dL (ref 0.3–1.2)

## 2011-02-02 LAB — APTT: aPTT: 41 seconds — ABNORMAL HIGH (ref 24–37)

## 2011-02-02 LAB — PROTIME-INR: Prothrombin Time: 15.9 seconds — ABNORMAL HIGH (ref 11.6–15.2)

## 2011-02-02 MED ORDER — POTASSIUM CHLORIDE 10 MEQ/100ML IV SOLN
10.0000 meq | INTRAVENOUS | Status: AC
  Administered 2011-02-02 (×2): 10 meq via INTRAVENOUS
  Filled 2011-02-02 (×2): qty 100

## 2011-02-02 MED ORDER — POTASSIUM CHLORIDE IN NACL 40-0.9 MEQ/L-% IV SOLN
INTRAVENOUS | Status: DC
  Administered 2011-02-02 – 2011-02-04 (×3): via INTRAVENOUS
  Administered 2011-02-05: 50 mL/h via INTRAVENOUS
  Filled 2011-02-02 (×7): qty 1000

## 2011-02-02 NOTE — Consults (Signed)
Reason for Consult: Cystic right adnexal mass with interspersed air, history of diverticulosis Referring Physician: Dr. Leroy [name] is an 45 y.o. female. She is a gravida 4 para 2-0-2-2 last menstrual period one week ago who experienced a dull lower abdominal discomfort of mild nature as well as the fever to 102 as described by Dr. Rito [name] in his admitting note. GYN history is notable for a simple cyst on the right ovary documented this summer by transvaginal ultrasound at this hospital.  Pertinent Gynecological History: Menses: regular every month without intermenstrual spotting Bleeding:  Contraception:  DES exposure: unknown Blood transfusions: none Sexually transmitted diseases: no past history Previous GYN Procedures: none  Last mammogram: normal Date:  Last pap: normal Date: Dr Sheila [name], seen this summer OB History: G4, P2-0-2-2   Menstrual History: Menarche age:  Patient's last menstrual period was 01/25/2011.    Past Medical History  Diagnosis Date  . Diverticulitis   . Migraine headache     Past Surgical History  Procedure Date  . Cholecystectomy   . Tonsillectomy   . Colonoscopy 12/05/2010    Procedure: COLONOSCOPY;  Surgeon: Corbin Ade, MD;  Location: [facility];  Service: Endoscopy;  Laterality: N/A;  8:15AM    Family History  Problem Relation Age of Onset  . Colon cancer Neg Hx   . Hypertension Father   . Hyperlipidemia Father     Social History:  reports that she has never smoked. She has never used smokeless tobacco. She reports that she does not drink alcohol or use illicit drugs.  Allergies:  Allergies  Allergen Reactions  . Lactaid (Lactase)   . Sulfa Antibiotics Rash      ROS Feels slightly more comfortable today than at the time of admission she rates the pain in the right lower quadrant at a 2 or 3,  Blood pressure 118/77, pulse 108, temperature 99.5 F (37.5 C), temperature source Oral, resp. rate 18, height 5\' 5"   (1.651 m), weight 84.4 kg (186 lb 1.1 oz), last menstrual period 01/25/2011, SpO2 96.00%. Physical ExamPhysical Examination: Abdomen - tenderness noted to be quite mild in the left lower quadrant only no rebound tenderness noted No guarding or rebound noted . Ct Abdomen Pelvis W Contrast  02/01/2011  *RADIOLOGY REPORT*  Clinical Data: Abdominal pain.  Diverticulitis.  CT ABDOMEN AND PELVIS WITH CONTRAST  Technique:  Multidetector CT imaging of the abdomen and pelvis was performed following the standard protocol during bolus administration of intravenous contrast.  Contrast: OMNIPAQUE IOHEXOL 300 MG/ML IV SOLN  Comparison: 10/25/2010.  Findings: Lung bases are clear.  Heart size normal.  No pericardial or pleural effusion.  Liver is unremarkable.  Cholecystectomy.  Adrenal glands, kidneys, spleen, pancreas stomach and small bowel are unremarkable with exception of a small hiatal hernia.  The sigmoid colon is seen adjacent to a collection of fluid and air in the right adnexa, measuring approximately 5.9 x 6.2 cm (image 68).  It is larger than on 10/25/2010, at which time a thick-walled fluid collection measured 3.5 x 4.7 cm.  On coronal images 59 and 67, there are possible fistulous extensions toward this fluid collection.  Remainder of the colon and appendix are unremarkable.  Uterus and left ovary are visualized.  Right ovary is difficult to identify as a separate structure.  Trace pelvic free fluid. Retroperitoneal lymph nodes are numerous but not enlarged by CT size criteria.  No worrisome lytic or sclerotic lesions.  IMPRESSION:  1.  Large collection  of fluid and air in the right adnexa is highly worrisome for abscess, and has enlarged in the interval from 10/25/2010.  Question a fistulous connection with the adjacent sigmoid colon. Differential diagnosis includes complicated diverticulitis as well as tubo-ovarian abscess. 2.  Small free fluid.  Original Report Authenticated By: Sheila [name],  M.D.   CT pictures reviewed, and a completely different type of right ovarian cystic structure was noted with internal air noted. Assessment/Plan: Atypical cystic right adnexal structure, significantly changed from simple cyst noted in July of this year. Will require comparison transvaginal ultrasound which is ordered Agree with current antibiotic regimen covering diverticular disease Will follow with you  Sheila [name] 02/02/2011

## 2011-02-02 NOTE — Consult Note (Signed)
Reason for Consult: Intra-abdominal abscess Referring Physician: [facility] hospitalist  ANIJAH [name] is an 45 y.o. female.  HPI: Patient presented to [facility] after workup demonstrated an abscess in her right pelvis. Patient does have a history of diverticulitis which she was treated for in July on oral antibiotics. This resolved and she not had any issues until the last approximate week. She was restarted back on oral antibiotics. Continued to have some symptomatology and therefore additional workup was undertaken. She denies any current fevers or chills but had some intermittent episodes prior. No significant nausea or vomiting. She has had some nonbloody diarrhea which is somewhat improved. She denies any melena or hematochezia. She has had previous colonoscopy which demonstrated diverticulosis of the sigmoid colon. She's denied any vaginal discharge. No vaginal bleeding or purulence.    Past Medical History  Diagnosis Date  . Diverticulitis   . Migraine headache     Past Surgical History  Procedure Date  . Cholecystectomy   . Tonsillectomy   . Colonoscopy 12/05/2010    Procedure: COLONOSCOPY;  Surgeon: Corbin Ade, MD;  Location: [facility];  Service: Endoscopy;  Laterality: N/A;  8:15AM    Family History  Problem Relation Age of Onset  . Colon cancer Neg Hx   . Hypertension Father   . Hyperlipidemia Father     Social History:  reports that she has never smoked. She has never used smokeless tobacco. She reports that she does not drink alcohol or use illicit drugs.  Allergies:  Allergies  Allergen Reactions  . Lactaid (Lactase)   . Sulfa Antibiotics Rash    Medications:  Prior to Admission:  Prescriptions prior to admission  Medication Sig Dispense Refill  . ciprofloxacin (CIPRO) 500 MG tablet Take 500 mg by mouth 2 (two) times daily.        [name] ibuprofen (ADVIL,MOTRIN) 800 MG tablet Take 800 mg by mouth every 6 (six) hours as needed. For pain       . metroNIDAZOLE  (FLAGYL) 500 MG tablet Take 500 mg by mouth 2 (two) times daily.         Scheduled:   . acetaminophen  650 mg Oral Once  . piperacillin-tazobactam (ZOSYN)  IV  3.375 g Intravenous Q8H  . potassium chloride  10 mEq Intravenous Q1 Hr x 4  . sodium chloride  500 mL Intravenous Once  . DISCONTD: dextrose 5 % and 0.45% NaCl   Intravenous STAT   Continuous:   . 0.9 % NaCl with KCl 40 mEq / L    . DISCONTD: sodium chloride 500 mL (02/01/11 1808)  . DISCONTD: dextrose 5 % and 0.45 % NaCl with KCl 20 mEq/L 125 mL/hr at 02/02/11 1610   RUE:[id number], albuterol, HYDROmorphone, ondansetron (ZOFRAN) IV, ondansetron, oxyCODONE, DISCONTD: acetaminophen  Results for orders placed during the hospital encounter of 02/01/11 (from the past 48 hour(s))  CBC     Status: Abnormal   Collection Time   02/01/11  5:06 PM      Component Value Range Comment   WBC 18.2 (*) 4.0 - 10.5 (K/uL)    RBC 4.04  3.87 - 5.11 (MIL/uL)    Hemoglobin 11.6 (*) 12.0 - 15.0 (g/dL)    HCT 29.5 (*) 62.1 - 46.0 (%)    MCV 85.9  78.0 - 100.0 (fL)    MCH 28.7  26.0 - 34.0 (pg)    MCHC 33.4  30.0 - 36.0 (g/dL)    RDW 30.8  65.7 - 84.6 (%)  Platelets 458 (*) 150 - 400 (K/uL)   DIFFERENTIAL     Status: Abnormal   Collection Time   02/01/11  5:06 PM      Component Value Range Comment   Neutrophils Relative 81 (*) 43 - 77 (%)    Neutro Abs 14.7 (*) 1.7 - 7.7 (K/uL)    Lymphocytes Relative 11 (*) 12 - 46 (%)    Lymphs Abs 2.1  0.7 - 4.0 (K/uL)    Monocytes Relative 8  3 - 12 (%)    Monocytes Absolute 1.4 (*) 0.1 - 1.0 (K/uL)    Eosinophils Relative 0  0 - 5 (%)    Eosinophils Absolute 0.0  0.0 - 0.7 (K/uL)    Basophils Relative 0  0 - 1 (%)    Basophils Absolute 0.0  0.0 - 0.1 (K/uL)   BASIC METABOLIC PANEL     Status: Abnormal   Collection Time   02/01/11  5:06 PM      Component Value Range Comment   Sodium 132 (*) 135 - 145 (mEq/L)    Potassium 3.1 (*) 3.5 - 5.1 (mEq/L)    Chloride 92 (*) 96 - 112 (mEq/L)    CO2  26  19 - 32 (mEq/L)    Glucose, Bld 96  70 - 99 (mg/dL)    BUN 7  6 - 23 (mg/dL)    Creatinine, Ser 2.13  0.50 - 1.10 (mg/dL)    Calcium 9.6  8.4 - 10.5 (mg/dL)    GFR calc non Af Amer >90  >90 (mL/min)    GFR calc Af Amer >90  >90 (mL/min)   URINALYSIS, ROUTINE W REFLEX MICROSCOPIC     Status: Abnormal   Collection Time   02/01/11  5:17 PM      Component Value Range Comment   Color, Urine YELLOW  YELLOW     Appearance CLEAR  CLEAR     Specific Gravity, Urine 1.025  1.005 - 1.030     pH 5.5  5.0 - 8.0     Glucose, UA NEGATIVE  NEGATIVE (mg/dL)    Hgb urine dipstick TRACE (*) NEGATIVE     Bilirubin Urine SMALL (*) NEGATIVE     Ketones, ur 40 (*) NEGATIVE (mg/dL)    Protein, ur TRACE (*) NEGATIVE (mg/dL)    Urobilinogen, UA 0.2  0.0 - 1.0 (mg/dL)    Nitrite NEGATIVE  NEGATIVE     Leukocytes, UA TRACE (*) NEGATIVE    PREGNANCY, URINE     Status: Normal   Collection Time   02/01/11  5:17 PM      Component Value Range Comment   Preg Test, Ur NEGATIVE     URINE MICROSCOPIC-ADD ON     Status: Abnormal   Collection Time   02/01/11  5:17 PM      Component Value Range Comment   Squamous Epithelial / LPF FEW (*) RARE     WBC, UA 0-2  <3 (WBC/hpf)    RBC / HPF 0-2  <3 (RBC/hpf)    Bacteria, UA RARE  RARE    WET PREP, GENITAL     Status: Abnormal   Collection Time   02/01/11  5:45 PM      Component Value Range Comment   Yeast, Wet Prep NONE SEEN  NONE SEEN     Trich, Wet Prep NONE SEEN  NONE SEEN     Clue Cells, Wet Prep NONE SEEN  NONE SEEN     WBC, Wet Prep  HPF POC FEW (*) NONE SEEN    GC/CHLAMYDIA PROBE AMP, GENITAL     Status: Normal   Collection Time   02/01/11  5:45 PM      Component Value Range Comment   GC Probe Amp, Genital NEGATIVE  NEGATIVE     Chlamydia, DNA Probe NEGATIVE  NEGATIVE    CULTURE, BLOOD (ROUTINE X 2)     Status: Normal (Preliminary result)   Collection Time   02/01/11  8:05 PM      Component Value Range Comment   Specimen Description BLOOD RIGHT ARM       Special Requests BOTTLES DRAWN AEROBIC AND ANAEROBIC 5CC      Culture NO GROWTH 1 DAY      Report Status PENDING     CULTURE, BLOOD (ROUTINE X 2)     Status: Normal (Preliminary result)   Collection Time   02/01/11  8:11 PM      Component Value Range Comment   Specimen Description RIGHT ANTECUBITAL      Special Requests BOTTLES DRAWN AEROBIC AND ANAEROBIC 5CC      Culture NO GROWTH 1 DAY      Report Status PENDING     COMPREHENSIVE METABOLIC PANEL     Status: Abnormal   Collection Time   02/02/11  5:17 AM      Component Value Range Comment   Sodium 135  135 - 145 (mEq/L)    Potassium 3.1 (*) 3.5 - 5.1 (mEq/L)    Chloride 99  96 - 112 (mEq/L)    CO2 28  19 - 32 (mEq/L)    Glucose, Bld 109 (*) 70 - 99 (mg/dL)    BUN 5 (*) 6 - 23 (mg/dL)    Creatinine, Ser 1.61 (*) 0.50 - 1.10 (mg/dL)    Calcium 8.7  8.4 - 10.5 (mg/dL)    Total Protein 6.9  6.0 - 8.3 (g/dL)    Albumin 2.9 (*) 3.5 - 5.2 (g/dL)    AST 12  0 - 37 (U/L)    ALT 19  0 - 35 (U/L)    Alkaline Phosphatase 82  39 - 117 (U/L)    Total Bilirubin 0.5  0.3 - 1.2 (mg/dL)    GFR calc non Af Amer >90  >90 (mL/min)    GFR calc Af Amer >90  >90 (mL/min)   CBC     Status: Abnormal   Collection Time   02/02/11  5:17 AM      Component Value Range Comment   WBC 13.7 (*) 4.0 - 10.5 (K/uL)    RBC 3.68 (*) 3.87 - 5.11 (MIL/uL)    Hemoglobin 10.5 (*) 12.0 - 15.0 (g/dL)    HCT 09.6 (*) 04.5 - 46.0 (%)    MCV 86.4  78.0 - 100.0 (fL)    MCH 28.5  26.0 - 34.0 (pg)    MCHC 33.0  30.0 - 36.0 (g/dL)    RDW 40.9  81.1 - 91.4 (%)    Platelets 427 (*) 150 - 400 (K/uL)   PROTIME-INR     Status: Abnormal   Collection Time   02/02/11  5:17 AM      Component Value Range Comment   Prothrombin Time 15.9 (*) 11.6 - 15.2 (seconds)    INR 1.24  0.00 - 1.49    APTT     Status: Abnormal   Collection Time   02/02/11  5:17 AM      Component Value Range Comment   aPTT 41 (*)  24 - 37 (seconds)   MAGNESIUM     Status: Normal   Collection Time     02/02/11  5:17 AM      Component Value Range Comment   Magnesium 1.9  1.5 - 2.5 (mg/dL)     Ct Abdomen Pelvis W Contrast  02/01/2011  *RADIOLOGY REPORT*  Clinical Data: Abdominal pain.  Diverticulitis.  CT ABDOMEN AND PELVIS WITH CONTRAST  Technique:  Multidetector CT imaging of the abdomen and pelvis was performed following the standard protocol during bolus administration of intravenous contrast.  Contrast: OMNIPAQUE IOHEXOL 300 MG/ML IV SOLN  Comparison: 10/25/2010.  Findings: Lung bases are clear.  Heart size normal.  No pericardial or pleural effusion.  Liver is unremarkable.  Cholecystectomy.  Adrenal glands, kidneys, spleen, pancreas stomach and small bowel are unremarkable with exception of a small hiatal hernia.  The sigmoid colon is seen adjacent to a collection of fluid and air in the right adnexa, measuring approximately 5.9 x 6.2 cm (image 68).  It is larger than on 10/25/2010, at which time a thick-walled fluid collection measured 3.5 x 4.7 cm.  On coronal images 59 and 67, there are possible fistulous extensions toward this fluid collection.  Remainder of the colon and appendix are unremarkable.  Uterus and left ovary are visualized.  Right ovary is difficult to identify as a separate structure.  Trace pelvic free fluid. Retroperitoneal lymph nodes are numerous but not enlarged by CT size criteria.  No worrisome lytic or sclerotic lesions.  IMPRESSION:  1.  Large collection of fluid and air in the right adnexa is highly worrisome for abscess, and has enlarged in the interval from 10/25/2010.  Question a fistulous connection with the adjacent sigmoid colon. Differential diagnosis includes complicated diverticulitis as well as tubo-ovarian abscess. 2.  Small free fluid.  Original Report Authenticated By: Reyes Ivan, M.D.    Review of Systems  Constitutional: Positive for fever, chills and weight loss. Negative for malaise/fatigue and diaphoresis.  HENT: Negative.   Eyes:  Negative.   Respiratory: Negative.   Cardiovascular: Negative.   Gastrointestinal: Positive for nausea, abdominal pain (left lower quadrant) and diarrhea. Negative for heartburn, vomiting, constipation, blood in stool and melena.  Genitourinary: Negative.   Musculoskeletal: Negative.   Skin: Negative.   Neurological: Negative.  Negative for weakness.  Endo/Heme/Allergies: Negative.   Psychiatric/Behavioral: Negative.    Blood pressure 118/77, pulse 108, temperature 99.5 F (37.5 C), temperature source Oral, resp. rate 18, height 5\' 5"  (1.651 m), weight 84.4 kg (186 lb 1.1 oz), last menstrual period 01/25/2011, SpO2 96.00%. Physical Exam  Constitutional: She is oriented to person, place, and time. She appears well-developed and well-nourished. No distress.       Moderately obese  HENT:  Head: Normocephalic and atraumatic.  Eyes: Conjunctivae and EOM are normal. Pupils are equal, round, and reactive to light. No scleral icterus.  Neck: Normal range of motion. Neck supple. No tracheal deviation present. No thyromegaly present.  Cardiovascular: Normal rate, regular rhythm and normal heart sounds.   Respiratory: Effort normal and breath sounds normal. No respiratory distress. She has no wheezes.  GI: Soft. Bowel sounds are normal. She exhibits no distension and no mass. There is tenderness (mild suprapubic tendernessto deep palpation. No diffuse peritoneal signs.). There is no rebound and no guarding.  Lymphadenopathy:    She has no cervical adenopathy.  Neurological: She is alert and oriented to person, place, and time.  Skin: Skin is warm and dry.  Assessment/Plan: Intra-abdominal abscess/diverticulitis. At this point CT findings are suspicious for a abscess in the right side of the pelvis. I did discuss with the patient both a diverticular process as well as the potential tubo-ovarian process. Patient has been evaluated by Dr. Emelda Fear who is also following the patient in continuing her  workup. Clinically at this time her abdomen is not surgical. I did discuss with the patient surgical indications. I also discussed with the patient possible fistulous formation however based on her clinical history and current clinical presentation I have an extremely low suspicion of this. At this point continue IV fluid hydration, IV antibiotics, DVT prophylaxis, and optimize bowel rest. Patient is currently tolerating clear liquids and I am agreeable as long as patient's symptomatology is worsening. I will continue to follow the patient and appreciate the opportunity to assist in her care.  Keara Pagliarulo C 02/02/2011, 4:45 PM

## 2011-02-02 NOTE — Progress Notes (Signed)
Subjective: Complains of headache. No nausea. Still some right lower cochlea pain. No diarrhea. Feels hot and weak.  Objective: Vital signs in last 24 hours: Filed Vitals:   02/01/11 1954 02/01/11 2130 02/02/11 0304 02/02/11 0616  BP: 130/74 136/85  125/80  Pulse: 101 105  104  Temp:  98.3 F (36.8 C)  98.6 F (37 C)  TempSrc:  Oral  Oral  Resp: 15 20  20   Height:  5\' 5"  (1.651 m)    Weight:  84.8 kg (186 lb 15.2 oz) 84.4 kg (186 lb 1.1 oz)   SpO2: 96% 98%  97%   Weight change:   Intake/Output Summary (Last 24 hours) at 02/02/11 1441 Last data filed at 02/02/11 0830  Gross per 24 hour  Intake   1147 ml  Output   1201 ml  Net    -54 ml   General: Nontoxic. Alert and oriented. Lungs clear to auscultation bilaterally without wheeze rhonchi or rales Cardiovascular regular rate rhythm without murmurs gallops rubs Abdomen soft normal bowel sounds nondistended mild right-sided tenderness. Extremities no clubbing cyanosis or edema.  Lab Results: Basic Metabolic Panel:  Lab 02/02/11 1610 02/01/11 1706  NA 135 132*  K 3.1* 3.1*  CL 99 92*  CO2 28 26  GLUCOSE 109* 96  BUN 5* 7  CREATININE 0.48* 0.53  CALCIUM 8.7 9.6  MG 1.9 --  PHOS -- --   Liver Function Tests:  Lab 02/02/11 0517  AST 12  ALT 19  ALKPHOS 82  BILITOT 0.5  PROT 6.9  ALBUMIN 2.9*   No results found for this basename: LIPASE:2,AMYLASE:2 in the last 168 hours No results found for this basename: AMMONIA:2 in the last 168 hours CBC:  Lab 02/02/11 0517 02/01/11 1706  WBC 13.7* 18.2*  NEUTROABS -- 14.7*  HGB 10.5* 11.6*  HCT 31.8* 34.7*  MCV 86.4 85.9  PLT 427* 458*   Cardiac Enzymes: No results found for this basename: CKTOTAL:3,CKMB:3,CKMBINDEX:3,TROPONINI:3 in the last 168 hours BNP: No results found for this basename: POCBNP:3 in the last 168 hours D-Dimer: No results found for this basename: DDIMER:2 in the last 168 hours CBG: No results found for this basename: GLUCAP:6 in the last 168  hours Hemoglobin A1C: No results found for this basename: HGBA1C in the last 168 hours Fasting Lipid Panel: No results found for this basename: CHOL,HDL,LDLCALC,TRIG,CHOLHDL,LDLDIRECT in the last 960 hours Thyroid Function Tests: No results found for this basename: TSH,T4TOTAL,FREET4,T3FREE,THYROIDAB in the last 168 hours Anemia Panel: No results found for this basename: VITAMINB12,FOLATE,FERRITIN,TIBC,IRON,RETICCTPCT in the last 168 hours   Micro Results: Recent Results (from the past 240 hour(s))  WET PREP, GENITAL     Status: Abnormal   Collection Time   02/01/11  5:45 PM      Component Value Range Status Comment   Yeast, Wet Prep NONE SEEN  NONE SEEN  Final    Trich, Wet Prep NONE SEEN  NONE SEEN  Final    Clue Cells, Wet Prep NONE SEEN  NONE SEEN  Final    WBC, Wet Prep HPF POC FEW (*) NONE SEEN  Final   CULTURE, BLOOD (ROUTINE X 2)     Status: Normal (Preliminary result)   Collection Time   02/01/11  8:05 PM      Component Value Range Status Comment   Specimen Description BLOOD RIGHT ARM   Final    Special Requests BOTTLES DRAWN AEROBIC AND ANAEROBIC 5CC   Final    Culture NO GROWTH 1 DAY  Final    Report Status PENDING   Incomplete   CULTURE, BLOOD (ROUTINE X 2)     Status: Normal (Preliminary result)   Collection Time   02/01/11  8:11 PM      Component Value Range Status Comment   Specimen Description RIGHT ANTECUBITAL   Final    Special Requests BOTTLES DRAWN AEROBIC AND ANAEROBIC 5CC   Final    Culture NO GROWTH 1 DAY   Final    Report Status PENDING   Incomplete    Studies/Results: Ct Abdomen Pelvis W Contrast  02/01/2011  *RADIOLOGY REPORT*  Clinical Data: Abdominal pain.  Diverticulitis.  CT ABDOMEN AND PELVIS WITH CONTRAST  Technique:  Multidetector CT imaging of the abdomen and pelvis was performed following the standard protocol during bolus administration of intravenous contrast.  Contrast: OMNIPAQUE IOHEXOL 300 MG/ML IV SOLN  Comparison: 10/25/2010.   Findings: Lung bases are clear.  Heart size normal.  No pericardial or pleural effusion.  Liver is unremarkable.  Cholecystectomy.  Adrenal glands, kidneys, spleen, pancreas stomach and small bowel are unremarkable with exception of a small hiatal hernia.  The sigmoid colon is seen adjacent to a collection of fluid and air in the right adnexa, measuring approximately 5.9 x 6.2 cm (image 68).  It is larger than on 10/25/2010, at which time a thick-walled fluid collection measured 3.5 x 4.7 cm.  On coronal images 59 and 67, there are possible fistulous extensions toward this fluid collection.  Remainder of the colon and appendix are unremarkable.  Uterus and left ovary are visualized.  Right ovary is difficult to identify as a separate structure.  Trace pelvic free fluid. Retroperitoneal lymph nodes are numerous but not enlarged by CT size criteria.  No worrisome lytic or sclerotic lesions.  IMPRESSION:  1.  Large collection of fluid and air in the right adnexa is highly worrisome for abscess, and has enlarged in the interval from 10/25/2010.  Question a fistulous connection with the adjacent sigmoid colon. Differential diagnosis includes complicated diverticulitis as well as tubo-ovarian abscess. 2.  Small free fluid.  Original Report Authenticated By: Reyes Ivan, M.D.   Scheduled Meds:   . acetaminophen  650 mg Oral Once  . piperacillin-tazobactam (ZOSYN)  IV  3.375 g Intravenous Q8H  . potassium chloride  10 mEq Intravenous Q1 Hr x 4  . sodium chloride  500 mL Intravenous Once  . DISCONTD: dextrose 5 % and 0.45% NaCl   Intravenous STAT   Continuous Infusions:   . 0.9 % NaCl with KCl 40 mEq / L    . DISCONTD: sodium chloride 500 mL (02/01/11 1808)  . DISCONTD: dextrose 5 % and 0.45 % NaCl with KCl 20 mEq/L 125 mL/hr at 02/02/11 0622   PRN Meds:.acetaminophen, albuterol, HYDROmorphone, ondansetron (ZOFRAN) IV, ondansetron, oxyCODONE, DISCONTD: acetaminophen Assessment/Plan: Principal  Problem:  *Abdominal abscess Active Problems:  SIRS (systemic inflammatory response syndrome)  Hypokalemia  I discussed the case with doctors Brooke Bonito (radiologist), Rourk. This may be a tubo-ovarian abscess with fistulization to bowel. I will get gynecology involved. Continue Zosyn. Start a clear liquid diet. Replete potassium IV. I will also get records from Dr. Varney Baas office. From what I can piece together, patient received a course of ciprofloxacin and Flagyl in about June. She apparently had an abnormal pelvic exam which led to CAT scan and pelvic ultrasound. She was noted to have a right ovarian cyst. She was referred to Dr. Kendell Bane by Dr. [name], gynecology. She had  no symptoms at that time. 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 She did not develop symptoms until just recently.   LOS: 1 day   Ashara Lounsbury L 02/02/2011, 2:41 PM

## 2011-02-03 ENCOUNTER — Encounter: Payer: Self-pay | Admitting: Internal Medicine

## 2011-02-03 ENCOUNTER — Other Ambulatory Visit (HOSPITAL_COMMUNITY): Payer: BC Managed Care – PPO

## 2011-02-03 ENCOUNTER — Inpatient Hospital Stay (HOSPITAL_COMMUNITY): Payer: BC Managed Care – PPO

## 2011-02-03 LAB — CBC
HCT: 31.5 % — ABNORMAL LOW (ref 36.0–46.0)
MCV: 86.5 fL (ref 78.0–100.0)
RBC: 3.64 MIL/uL — ABNORMAL LOW (ref 3.87–5.11)
RDW: 12.3 % (ref 11.5–15.5)
WBC: 14.1 10*3/uL — ABNORMAL HIGH (ref 4.0–10.5)

## 2011-02-03 LAB — BASIC METABOLIC PANEL
CO2: 24 mEq/L (ref 19–32)
Chloride: 102 mEq/L (ref 96–112)
GFR calc Af Amer: >90 mL/min (ref >90)
Potassium: 3.8 mEq/L (ref 3.5–5.1)
Sodium: 136 mEq/L (ref 135–145)

## 2011-02-03 LAB — DIFFERENTIAL
Basophils Absolute: 0 10*3/uL (ref 0.0–0.1)
Eosinophils Relative: 1 % (ref 0–5)
Lymphocytes Relative: 10 % — ABNORMAL LOW (ref 12–46)
Lymphs Abs: 1.4 10*3/uL (ref 0.7–4.0)
Monocytes Absolute: 1.4 10*3/uL — ABNORMAL HIGH (ref 0.1–1.0)
Neutro Abs: 11.2 10*3/uL — ABNORMAL HIGH (ref 1.7–7.7)

## 2011-02-03 LAB — SEDIMENTATION RATE: Sed Rate: 125 mm/hr — ABNORMAL HIGH (ref 0–22)

## 2011-02-03 MED ORDER — VANCOMYCIN HCL IN DEXTROSE 1-5 GM/200ML-% IV SOLN
INTRAVENOUS | Status: AC
  Filled 2011-02-03: qty 200

## 2011-02-03 MED ORDER — DOXYCYCLINE HYCLATE 100 MG PO TABS
100.0000 mg | ORAL_TABLET | Freq: Two times a day (BID) | ORAL | Status: DC
  Administered 2011-02-03 – 2011-02-05 (×5): 100 mg via ORAL
  Filled 2011-02-03 (×5): qty 1

## 2011-02-03 MED ORDER — VANCOMYCIN HCL IN DEXTROSE 1-5 GM/200ML-% IV SOLN
1000.0000 mg | Freq: Two times a day (BID) | INTRAVENOUS | Status: DC
  Administered 2011-02-03 – 2011-02-05 (×5): 1000 mg via INTRAVENOUS
  Filled 2011-02-03 (×7): qty 200

## 2011-02-03 NOTE — Consult Note (Signed)
ANTIBIOTIC CONSULT NOTE - INITIAL  Pharmacy Consult for vancomycin Indication: abdominal abscess, cellulitis  Allergies  Allergen Reactions  . Lactaid (Lactase)   . Sulfa Antibiotics Rash   Patient Measurements: Height: 5\' 5"  (165.1 cm) Weight: 182 lb 5.1 oz (82.7 kg) IBW/kg (Calculated) : 57   Vital Signs: Temp: 99.2 F (37.3 C) (10/26 0449) Temp src: Oral (10/26 0449) BP: 131/81 mmHg (10/26 0449) Pulse Rate: 103  (10/26 0449) Intake/Output from previous day: 10/25 0701 - 10/26 0700 In: 3291 [P.O.:1025; I.V.:1966; IV Piggyback:300] Out: 2204 [Urine:2200; Stool:4] Intake/Output from this shift:    Labs:  Basename 02/03/11 0521 02/02/11 0517 02/01/11 1706  WBC 14.1* 13.7* 18.2*  HGB 10.6* 10.5* 11.6*  PLT 445* 427* 458*  LABCREA -- -- --  CREATININE 0.48* 0.48* 0.53   Estimated Creatinine Clearance: 96.3 ml/min (by C-G formula based on Cr of 0.48). No results found for this basename: VANCOTROUGH:2,VANCOPEAK:2,VANCORANDOM:2,GENTTROUGH:2,GENTPEAK:2,GENTRANDOM:2,TOBRATROUGH:2,TOBRAPEAK:2,TOBRARND:2,AMIKACINPEAK:2,AMIKACINTROU:2,AMIKACIN:2, in the last 72 hours   Microbiology: Recent Results (from the past 720 hour(s))  WET PREP, GENITAL     Status: Abnormal   Collection Time   02/01/11  5:45 PM      Component Value Range Status Comment   Yeast, Wet Prep NONE SEEN  NONE SEEN  Final    Trich, Wet Prep NONE SEEN  NONE SEEN  Final    Clue Cells, Wet Prep NONE SEEN  NONE SEEN  Final    WBC, Wet Prep HPF POC FEW (*) NONE SEEN  Final   CULTURE, BLOOD (ROUTINE X 2)     Status: Normal (Preliminary result)   Collection Time   02/01/11  8:05 PM      Component Value Range Status Comment   Specimen Description BLOOD RIGHT ARM   Final    Special Requests BOTTLES DRAWN AEROBIC AND ANAEROBIC 5CC   Final    Culture     Final    Value: GRAM POSITIVE COCCI Gram Stain Report Called to,Read Back By and Verified With: NELSON T ON 02/02/2011 AT 2100 BY MILES A   Report Status PENDING    Incomplete   CULTURE, BLOOD (ROUTINE X 2)     Status: Normal (Preliminary result)   Collection Time   02/01/11  8:11 PM      Component Value Range Status Comment   Specimen Description RIGHT ANTECUBITAL   Final    Special Requests BOTTLES DRAWN AEROBIC AND ANAEROBIC 5CC   Final    Culture NO GROWTH 1 DAY   Final    Report Status PENDING   Incomplete    Medical History: Past Medical History  Diagnosis Date  . Diverticulitis   . Migraine headache    Medications:  Scheduled:    . piperacillin-tazobactam (ZOSYN)  IV  3.375 g Intravenous Q8H  . potassium chloride  10 mEq Intravenous Q1 Hr x 4  . potassium chloride  10 mEq Intravenous Q1 Hr x 4  . vancomycin  1,000 mg Intravenous Q12H   Assessment: Good renal fxn  Goal of Therapy:  Vancomycin trough level 10-15 mcg/ml  Plan:  Measure antibiotic drug levels at steady state (trough on Sunday morning) Labs per protocol  Valrie Hart A 02/03/2011,10:45 AM

## 2011-02-03 NOTE — Progress Notes (Signed)
Subjective: Patient reports tolerating PO and + flatus.  She is currently on clear liquids only, but is hungry. She denies nausea or vomiting. Ultrasound reveals a right tuboovarian abscess 5 x 8 x 5 cm, with air within a descretely encapsulated tissue wall. Additionally, it reveals an endometrial thickening, consistent with a large endometrial polyp, and several small intramural fibroids. She reports no sexual activity since 2005.  Menses have been regular monthly without excess bleeding.  Objective: I have reviewed patient's vital signs, medications, labs and radiology results.  General: alert, cooperative and no distress GI: normal findings: soft, non-tender no guarding or rebound.   Assessment/Plan: Unilateral right tubo-ovarian abscess, responding to IV antibiotic conservative therapy plan continue inpatient antibiotics for at least 72 hours, with plans to and doxycycline 100 mg twice a day to antibiotic regimen. The patient remains stable will be converted to outpatient care. Endometrial polyp will require biopsy and/or excision, which can be done easier once pt is recovered.  LOS: 2 days    Sheila [name] V 02/03/2011, 3:46 PM

## 2011-02-03 NOTE — Progress Notes (Signed)
  Subjective: Pain remains minimal. Does have some diarrhea. No hematochezia. No fevers chills. Some nausea but by mouth.  Objective: Vital signs in last 24 hours: Temp:  [98.4 F (36.9 C)-99.5 F (37.5 C)] 98.6 F (37 C) (10/26 1406) Pulse Rate:  [98-108] 101  (10/26 1406) Resp:  [16-18] 18  (10/26 1406) BP: (118-131)/(77-85) 127/85 mmHg (10/26 1406) SpO2:  [96 %-97 %] 96 % (10/26 1406) Weight:  [82.7 kg (182 lb 5.1 oz)] 182 lb 5.1 oz (82.7 kg) (10/26 0232) Last BM Date: 02/03/11  Intake/Output from previous day: 10/25 0701 - 10/26 0700 In: 3291 [P.O.:1025; I.V.:1966; IV Piggyback:300] Out: 2204 [Urine:2200; Stool:4] Intake/Output this shift:    General appearance: alert and no distress GI: Soft, obese, mild to moderate tenderness to deep palpation. No diffuse peritoneal signs.  Lab Results:  @LABLAST2 (wbc:2,hgb:2,hct:2,plt:2) BMET  Basename 02/03/11 0521 02/02/11 0517  NA 136 135  K 3.8 3.1*  CL 102 99  CO2 24 28  GLUCOSE 98 109*  BUN 4* 5*  CREATININE 0.48* 0.48*  CALCIUM 9.0 8.7   PT/INR  Basename 02/02/11 0517  LABPROT 15.9*  INR 1.24   ABG No results found for this basename: PHART:2,PCO2:2,PO2:2,HCO3:2 in the last 72 hours  Studies/Results: No results found.  Anti-infectives: Anti-infectives    None      Assessment/Plan: s/p  Acute diverticulitis. I still feel that the tubo-ovarian issue is a separate issue. Vaginal ultrasound is still pending. At this time I would continue to treat the diverticulitis conservatively with IV antibiotics continue clear fluids and IV fluid hydration. Should surgical intervention be required for the tubo-ovarian issue, I did discuss with Dr. Emelda Fear that I would attempt to be present at the time of the procedure to inspect the colon although at this time I do not feel that surgical intervention for the diverticular process is warranted or needed. Will continue to follow the patient's progress and course.  LOS: 2 days      Sheila [name] C 02/03/2011

## 2011-02-03 NOTE — Progress Notes (Signed)
Dr. Lendell Caprice called me yesterday about this patient. Apparently has right lower quadrant abnormality. She says is felt to be diverticulitis versus a GYN process. At any rate, she was getting Dr. Leticia Penna to see her in surgical consultation. She was felt to have a surgical process. Dr. Caffie Damme told me that if we were needed she would consult [place].

## 2011-02-04 LAB — BASIC METABOLIC PANEL
CO2: 26 mEq/L (ref 19–32)
Calcium: 9.1 mg/dL (ref 8.4–10.5)
Chloride: 99 mEq/L (ref 96–112)
Creatinine, Ser: 0.73 mg/dL (ref 0.50–1.10)
Glucose, Bld: 118 mg/dL — ABNORMAL HIGH (ref 70–99)
Sodium: 134 mEq/L — ABNORMAL LOW (ref 135–145)

## 2011-02-04 MED ORDER — FLUCONAZOLE 100 MG PO TABS
150.0000 mg | ORAL_TABLET | Freq: Once | ORAL | Status: AC
  Administered 2011-02-04: 150 mg via ORAL
  Filled 2011-02-04: qty 1

## 2011-02-04 NOTE — Progress Notes (Signed)
Subjective: Patient reports tolerating PO, + flatus and + BM.  Loose dark bm's 2x today.   Objective: I have reviewed patient's vital signs, medications and labs.  General: alert and comfortable  Pain a 0 of 10 GI: normal findings: bowel sounds normal and nontender, no guarding or rebound  Afebrile x 48 hrs  Assessment/Plan: Right tuboovarian abscess, responding to iv abx  Will complete an additional 24 hrs of iv abx, then d/c for outpt continuation of care  LOS: 3 days    Tarez Bowns V 02/04/2011, 10:09 AM

## 2011-02-04 NOTE — Progress Notes (Signed)
Subjective: Minimal pain. No nausea vomiting. No fever or chills.  Objective: Vital signs in last 24 hours: Temp:  [98 F (36.7 [name])-98.9 F (37.2 [name])] 98.8 F (37.1 [name]) (10/27 1425) Pulse Rate:  [93-105] 93  (10/27 1425) Resp:  [16-24] 18  (10/27 1425) BP: (110-122)/(70-79) 122/79 mmHg (10/27 1425) SpO2:  [97 %-98 %] 97 % (10/27 1425) Last BM Date: 02/03/11  Intake/Output from previous day:   Intake/Output this shift:    General appearance: alert and no distress GI: Positive bowel sounds, soft, mild suprapubic tenderness. No diffuse peritoneal signs.  Lab Results:  @LABLAST2 (wbc:2,hgb:2,hct:2,plt:2) BMET  Basename 02/04/11 0615 02/03/11 0521  NA 134* 136  K 3.8 3.8  CL 99 102  CO2 26 24  GLUCOSE 118* 98  BUN 5* 4*  CREATININE 0.73 0.48*  CALCIUM 9.1 9.0   PT/INR  Basename 02/02/11 0517  LABPROT 15.9*  INR 1.24   ABG No results found for this basename: PHART:2,PCO2:2,PO2:2,HCO3:2 in the last 72 hours  Studies/Results: US Transvaginal Non-ob  02/03/2011  *RADIOLOGY REPORT*  Clinical Data: Right adnexal lesion seen on CT scan.  TRANSABDOMINAL AND TRANSVAGINAL ULTRASOUND OF PELVIS Technique:  Both transabdominal and transvaginal ultrasound examinations of the pelvis were performed. Transabdominal technique was performed for global imaging of the pelvis including uterus, ovaries, adnexal regions, and pelvic cul-de-sac.  Comparison: CT scan 02/01/2011.   It was necessary to proceed with endovaginal exam following the transabdominal exam to visualize the endometrium and ovaries.  Findings:  Uterus: Measures 7.9 x 4.9 x 4.9 cm.  Small fibroids are noted.  Endometrium: Normal in thickness measuring a maximum of 10.5 mm.  Right ovary:  Measures 8.3 x 86.3 x 7.5 cm.  There is a complex 5.5 cm fluid collection with multiple air collections correlate with the CT findings.  Adjacent tubular cystic structure is noted. Findings most suggestive of a tubo-ovarian abscess.  Left ovary:  Measures 2.2 x 1.5 x 2.0 cm.  No cysts or masses.  Other findings: No free pelvic fluid collections.  IMPRESSION:  1.  Complex 5 cm right adnexal fluid collection with air correlating with the CT scan. Tubo- ovarian abscess is favored. 2.  Normal uterus except for a few small fibroids. 3.  Normal left ovary.  Original Report Authenticated By: P. Loralie Champagne, M.D.   US Pelvis Complete  02/03/2011  *RADIOLOGY REPORT*  Clinical Data: Right adnexal lesion seen on CT scan.  TRANSABDOMINAL AND TRANSVAGINAL ULTRASOUND OF PELVIS Technique:  Both transabdominal and transvaginal ultrasound examinations of the pelvis were performed. Transabdominal technique was performed for global imaging of the pelvis including uterus, ovaries, adnexal regions, and pelvic cul-de-sac.  Comparison: CT scan 02/01/2011.   It was necessary to proceed with endovaginal exam following the transabdominal exam to visualize the endometrium and ovaries.  Findings:  Uterus: Measures 7.9 x 4.9 x 4.9 cm.  Small fibroids are noted.  Endometrium: Normal in thickness measuring a maximum of 10.5 mm.  Right ovary:  Measures 8.3 x 86.3 x 7.5 cm.  There is a complex 5.5 cm fluid collection with multiple air collections correlate with the CT findings.  Adjacent tubular cystic structure is noted. Findings most suggestive of a tubo-ovarian abscess.  Left ovary: Measures 2.2 x 1.5 x 2.0 cm.  No cysts or masses.  Other findings: No free pelvic fluid collections.  IMPRESSION:  1.  Complex 5 cm right adnexal fluid collection with air correlating with the CT scan. Tubo- ovarian abscess is favored. 2.  Normal uterus except  for a few small fibroids. 3.  Normal left ovary.  Original Report Authenticated By: P. Loralie Champagne, M.D.    Anti-infectives: Anti-infectives    None      Assessment/Plan: s/p  Tubo-ovarian abscess, suspected mild acute diverticulitis. At this point continue IV antibiotics. No acute surgical indications as indicated. We'll continue  to follow the patient however at this point the field majority of her symptomatology is likely coming from the gynecologic process versus her:. Continue her on her current diet.  LOS: 3 days    Sheila [name] 02/04/2011

## 2011-02-05 DIAGNOSIS — N739 Female pelvic inflammatory disease, unspecified: Secondary | ICD-10-CM | POA: Diagnosis present

## 2011-02-05 LAB — VANCOMYCIN, TROUGH: Vancomycin Tr: 18.4 ug/mL (ref 10.0–20.0)

## 2011-02-05 LAB — CBC
HCT: 32.2 % — ABNORMAL LOW (ref 36.0–46.0)
Hemoglobin: 10.6 g/dL — ABNORMAL LOW (ref 12.0–15.0)
MCV: 87 fL (ref 78.0–100.0)
WBC: 11.1 10*3/uL — ABNORMAL HIGH (ref 4.0–10.5)

## 2011-02-05 MED ORDER — SODIUM CHLORIDE 0.9 % IJ SOLN
INTRAMUSCULAR | Status: AC
  Filled 2011-02-05: qty 10

## 2011-02-05 MED ORDER — NYSTATIN-TRIAMCINOLONE 100000-0.1 UNIT/GM-% EX CREA
TOPICAL_CREAM | Freq: Two times a day (BID) | CUTANEOUS | Status: DC
  Administered 2011-02-05 (×2): via TOPICAL
  Filled 2011-02-05: qty 15

## 2011-02-05 MED ORDER — SODIUM CHLORIDE 0.9 % IJ SOLN
INTRAMUSCULAR | Status: AC
  Filled 2011-02-05: qty 9

## 2011-02-05 NOTE — Progress Notes (Signed)
Subjective: Patient reports + flatus and + BM x 3 last night, none this am.  She remains hungry, but has quick satiety, feels full almost immediately. We discussed sending pt home on PO antibiotics versus proceeding to surgery tomorrow  Objective:  I have reviewed patient's vital signs, medications, labs and microbiology.  She has blood culture + but it is coagulase - staph, probably collection contaminant. WBC 11.1k, ESR Increasing 130  HGB stable  General: alert, cooperative, no distress and  GI: normal findings: bowel sounds normal and no masses palpable and abnormal findings:  Slight increase in tenderness in rlq, not noted yesterday. slight guarding, no rebound   Assessment/Plan: Right tuboovarian abscess, stable but not inproving.   After discussion of options, I recommend to pt that we proceed with removal of right adnexa in AM surgically.   Questions answered with pt and family member.  Risks of procedure, including bleeding, additional surgery up to and including hysterectomy discussed, with likelihood of successful removal thru laparoscopic approach estimated as high (95%).  Will do endometrial biopsy at time of surgical procedure, due to earlier question of thickened endometrium.   LOS: 4 days    Kenia Teagarden V 02/05/2011, 11:37 AM

## 2011-02-05 NOTE — Progress Notes (Signed)
Subjective: No acute change.  Objective: Vital signs in last 24 hours: Temp:  [98 F (36.7 [name])-99.7 F (37.6 [name])] 98 F (36.7 [name]) (10/28 0532) Pulse Rate:  [93-100] 93  (10/28 0532) Resp:  [16-20] 20  (10/28 0532) BP: (122-135)/(79-87) 124/81 mmHg (10/28 0532) SpO2:  [97 %] 97 % (10/28 0532) Last BM Date: 02/05/11  Intake/Output from previous day: 10/27 0701 - 10/28 0700 In: 1040 [P.O.:240; I.V.:550; IV Piggyback:250] Out: -  Intake/Output this shift:    General appearance: alert and no distress GI: soft, non-tender; bowel sounds normal; no masses,  no organomegaly  Lab Results:  @LABLAST2 (wbc:2,hgb:2,hct:2,plt:2) BMET  Basename 02/04/11 0615 02/03/11 0521  NA 134* 136  K 3.8 3.8  CL 99 102  CO2 26 24  GLUCOSE 118* 98  BUN 5* 4*  CREATININE 0.73 0.48*  CALCIUM 9.1 9.0   PT/INR No results found for this basename: LABPROT:2,INR:2 in the last 72 hours ABG No results found for this basename: PHART:2,PCO2:2,PO2:2,HCO3:2 in the last 72 hours  Studies/Results: US Transvaginal Non-ob  02/03/2011  *RADIOLOGY REPORT*  Clinical Data: Right adnexal lesion seen on CT scan.  TRANSABDOMINAL AND TRANSVAGINAL ULTRASOUND OF PELVIS Technique:  Both transabdominal and transvaginal ultrasound examinations of the pelvis were performed. Transabdominal technique was performed for global imaging of the pelvis including uterus, ovaries, adnexal regions, and pelvic cul-de-sac.  Comparison: CT scan 02/01/2011.   It was necessary to proceed with endovaginal exam following the transabdominal exam to visualize the endometrium and ovaries.  Findings:  Uterus: Measures 7.9 x 4.9 x 4.9 cm.  Small fibroids are noted.  Endometrium: Normal in thickness measuring a maximum of 10.5 mm.  Right ovary:  Measures 8.3 x 86.3 x 7.5 cm.  There is a complex 5.5 cm fluid collection with multiple air collections correlate with the CT findings.  Adjacent tubular cystic structure is noted. Findings most suggestive of a  tubo-ovarian abscess.  Left ovary: Measures 2.2 x 1.5 x 2.0 cm.  No cysts or masses.  Other findings: No free pelvic fluid collections.  IMPRESSION:  1.  Complex 5 cm right adnexal fluid collection with air correlating with the CT scan. Tubo- ovarian abscess is favored. 2.  Normal uterus except for a few small fibroids. 3.  Normal left ovary.  Original Report Authenticated By: P. Loralie Champagne, M.D.   US Pelvis Complete  02/03/2011  *RADIOLOGY REPORT*  Clinical Data: Right adnexal lesion seen on CT scan.  TRANSABDOMINAL AND TRANSVAGINAL ULTRASOUND OF PELVIS Technique:  Both transabdominal and transvaginal ultrasound examinations of the pelvis were performed. Transabdominal technique was performed for global imaging of the pelvis including uterus, ovaries, adnexal regions, and pelvic cul-de-sac.  Comparison: CT scan 02/01/2011.   It was necessary to proceed with endovaginal exam following the transabdominal exam to visualize the endometrium and ovaries.  Findings:  Uterus: Measures 7.9 x 4.9 x 4.9 cm.  Small fibroids are noted.  Endometrium: Normal in thickness measuring a maximum of 10.5 mm.  Right ovary:  Measures 8.3 x 86.3 x 7.5 cm.  There is a complex 5.5 cm fluid collection with multiple air collections correlate with the CT findings.  Adjacent tubular cystic structure is noted. Findings most suggestive of a tubo-ovarian abscess.  Left ovary: Measures 2.2 x 1.5 x 2.0 cm.  No cysts or masses.  Other findings: No free pelvic fluid collections.  IMPRESSION:  1.  Complex 5 cm right adnexal fluid collection with air correlating with the CT scan. Tubo- ovarian abscess is favored. 2.  Normal uterus except for a few small fibroids. 3.  Normal left ovary.  Original Report Authenticated By: P. Loralie Champagne, M.D.    Anti-infectives: Anti-infectives     Start     Dose/Rate Route Frequency Ordered Stop   02/04/11 2100   fluconazole (DIFLUCAN) tablet 150 mg        150 mg Oral  Once 02/04/11 2022 02/04/11 2115     02/03/11 0300   vancomycin (VANCOCIN) IVPB 1000 mg/200 mL premix  Status:  Discontinued        1,000 mg 200 mL/hr over 60 Minutes Intravenous Every 12 hours 02/03/11 0241 02/05/11 1209          Assessment/Plan: s/p  Tubo-ovarian abscess, mild associated acute diverticulitis also suspected. Acutely there is no immediate indications for general surgical intervention. I did discuss plans with Dr. Emelda Fear who is considering proceeding from a GYN standpoint to the operating room. This will be addressed with the patient. If available I did discuss that I would attempt to be present for evaluation of the rectum and sigmoid colon intraoperatively. Otherwise I will be available as needed. Upon discharge patient may followup with me as an outpatient to further discuss diverticular management.  LOS: 4 days    Sheila [name] 02/05/2011

## 2011-02-05 NOTE — Consult Note (Signed)
ANTIBIOTIC CONSULT NOTE - INITIAL  Pharmacy Consult for vancomycin Indication: abdominal abscess, cellulitis  Allergies  Allergen Reactions  . Lactaid (Lactase)   . Sulfa Antibiotics Rash   Patient Measurements: Height: 5\' 5"  (165.1 cm) Weight: 182 lb 5.1 oz (82.7 kg) IBW/kg (Calculated) : 57   Vital Signs: Temp: 98 F (36.7 C) (10/28 0532) BP: 124/81 mmHg (10/28 0532) Pulse Rate: 93  (10/28 0532) Intake/Output from previous day: 10/27 0701 - 10/28 0700 In: 1040 [P.O.:240; I.V.:550; IV Piggyback:250] Out: -  Intake/Output from this shift:    Labs:  Basename 02/05/11 0542 02/04/11 0615 02/03/11 0521  WBC 11.1* -- 14.1*  HGB 10.6* -- 10.6*  PLT 466* -- 445*  LABCREA -- -- --  CREATININE -- 0.73 0.48*   Estimated Creatinine Clearance: 96.3 ml/min (by C-G formula based on Cr of 0.73).  Basename 02/05/11 0542  VANCOTROUGH 18.4  VANCOPEAK --  VANCORANDOM --  GENTTROUGH --  GENTPEAK --  GENTRANDOM --  TOBRATROUGH --  TOBRAPEAK --  TOBRARND --  AMIKACINPEAK --  AMIKACINTROU --  AMIKACIN --     Microbiology: Recent Results (from the past 720 hour(s))  WET PREP, GENITAL     Status: Abnormal   Collection Time   02/01/11  5:45 PM      Component Value Range Status Comment   Yeast, Wet Prep NONE SEEN  NONE SEEN  Final    Trich, Wet Prep NONE SEEN  NONE SEEN  Final    Clue Cells, Wet Prep NONE SEEN  NONE SEEN  Final    WBC, Wet Prep HPF POC FEW (*) NONE SEEN  Final   CULTURE, BLOOD (ROUTINE X 2)     Status: Normal   Collection Time   02/01/11  8:05 PM      Component Value Range Status Comment   Specimen Description BLOOD RIGHT ARM   Final    Special Requests BOTTLES DRAWN AEROBIC AND ANAEROBIC 5CC   Final    Setup Time [id number]   Final    Culture     Final    Value: STAPHYLOCOCCUS SPECIES (COAGULASE NEGATIVE)     Note: THE SIGNIFICANCE OF ISOLATING THIS ORGANISM FROM A SINGLE VENIPUNCTURE CANNOT BE PREDICTED WITHOUT FURTHER CLINICAL AND CULTURE  CORRELATION. SUSCEPTIBILITIES AVAILABLE ONLY ON REQUEST.     Note: Gram Stain Report Called to,Read Back By and Verified With: NELSON T ON 02/02/2011 AT 2100 BY MILES A Performed at [facility]   Report Status 02/04/2011 FINAL   Final   CULTURE, BLOOD (ROUTINE X 2)     Status: Normal (Preliminary result)   Collection Time   02/01/11  8:11 PM      Component Value Range Status Comment   Specimen Description RIGHT ANTECUBITAL   Final    Special Requests BOTTLES DRAWN AEROBIC AND ANAEROBIC 5CC   Final    Culture NO GROWTH 3 DAYS   Final    Report Status PENDING   Incomplete    Medical History: Past Medical History  Diagnosis Date  . Diverticulitis   . Migraine headache    Medications:  Scheduled:     . doxycycline  100 mg Oral Q12H  . fluconazole  150 mg Oral Once  . piperacillin-tazobactam (ZOSYN)  IV  3.375 g Intravenous Q8H  . vancomycin  1,000 mg Intravenous Q12H   Assessment:  Trough a bit higher than target but anticipate dc antibiotic soon.  Goal of Therapy:   Vancomycin trough level 10-15 mcg/ml  Plan:  Continue Vancomycin 1000 mg IV every 12 hours. If needs outpatient Vancomycin consider 2 grams IV every 24 hours.    Gilman Buttner, [place] J 02/05/2011,8:56 AM

## 2011-02-05 NOTE — Progress Notes (Signed)
Discussed with Dr. Emelda Fear. Plans noted. Subjective: No new complaints.  Objective: Vital signs in last 24 hours: Filed Vitals:   02/04/11 1425 02/04/11 2120 02/05/11 0532 02/05/11 1344  BP: 122/79 135/87 124/81 128/80  Pulse: 93 100 93 106  Temp: 98.8 F (37.1 C) 99.7 F (37.6 C) 98 F (36.7 C) 98.7 F (37.1 C)  TempSrc:      Resp: 18 16 20 18   Height:      Weight:      SpO2: 97% 97% 97% 97%   Weight change:   Intake/Output Summary (Last 24 hours) at 02/05/11 1635 Last data filed at 02/04/11 1900  Gross per 24 hour  Intake    590 ml  Output      0 ml  Net    590 ml   General: Nontoxic. Alert and oriented. Lungs clear to auscultation bilaterally without wheeze rhonchi or rales Cardiovascular regular rate rhythm without murmurs gallops rubs Abdomen soft normal bowel sounds nondistended mild right-sided tenderness. Extremities no clubbing cyanosis or edema.  Lab Results: Basic Metabolic Panel:  Lab 02/04/11 1610 02/03/11 0521 02/02/11 0517  NA 134* 136 --  K 3.8 3.8 --  CL 99 102 --  CO2 26 24 --  GLUCOSE 118* 98 --  BUN 5* 4* --  CREATININE 0.73 0.48* --  CALCIUM 9.1 9.0 --  MG -- -- 1.9  PHOS -- -- --   Liver Function Tests:  Lab 02/02/11 0517  AST 12  ALT 19  ALKPHOS 82  BILITOT 0.5  PROT 6.9  ALBUMIN 2.9*   No results found for this basename: LIPASE:2,AMYLASE:2 in the last 168 hours No results found for this basename: AMMONIA:2 in the last 168 hours CBC:  Lab 02/05/11 0542 02/03/11 0521 02/01/11 1706  WBC 11.1* 14.1* --  NEUTROABS -- 11.2* 14.7*  HGB 10.6* 10.6* --  HCT 32.2* 31.5* --  MCV 87.0 86.5 --  PLT 466* 445* --   Cardiac Enzymes: No results found for this basename: CKTOTAL:3,CKMB:3,CKMBINDEX:3,TROPONINI:3 in the last 168 hours BNP: No results found for this basename: POCBNP:3 in the last 168 hours D-Dimer: No results found for this basename: DDIMER:2 in the last 168 hours CBG: No results found for this basename: GLUCAP:6 in  the last 168 hours Hemoglobin A1C: No results found for this basename: HGBA1C in the last 168 hours Fasting Lipid Panel: No results found for this basename: CHOL,HDL,LDLCALC,TRIG,CHOLHDL,LDLDIRECT in the last 960 hours Thyroid Function Tests: No results found for this basename: TSH,T4TOTAL,FREET4,T3FREE,THYROIDAB in the last 168 hours Anemia Panel: No results found for this basename: VITAMINB12,FOLATE,FERRITIN,TIBC,IRON,RETICCTPCT in the last 168 hours   Micro Results: Recent Results (from the past 240 hour(s))  WET PREP, GENITAL     Status: Abnormal   Collection Time   02/01/11  5:45 PM      Component Value Range Status Comment   Yeast, Wet Prep NONE SEEN  NONE SEEN  Final    Trich, Wet Prep NONE SEEN  NONE SEEN  Final    Clue Cells, Wet Prep NONE SEEN  NONE SEEN  Final    WBC, Wet Prep HPF POC FEW (*) NONE SEEN  Final   CULTURE, BLOOD (ROUTINE X 2)     Status: Normal   Collection Time   02/01/11  8:05 PM      Component Value Range Status Comment   Specimen Description BLOOD RIGHT ARM   Final    Special Requests BOTTLES DRAWN AEROBIC AND ANAEROBIC 5CC   Final  Setup Time [id number]   Final    Culture     Final    Value: STAPHYLOCOCCUS SPECIES (COAGULASE NEGATIVE)     Note: THE SIGNIFICANCE OF ISOLATING THIS ORGANISM FROM A SINGLE VENIPUNCTURE CANNOT BE PREDICTED WITHOUT FURTHER CLINICAL AND CULTURE CORRELATION. SUSCEPTIBILITIES AVAILABLE ONLY ON REQUEST.     Note: Gram Stain Report Called to,Read Back By and Verified With: NELSON T ON 02/02/2011 AT 2100 BY MILES A Performed at [facility]   Report Status 02/04/2011 FINAL   Final   CULTURE, BLOOD (ROUTINE X 2)     Status: Normal (Preliminary result)   Collection Time   02/01/11  8:11 PM      Component Value Range Status Comment   Specimen Description RIGHT ANTECUBITAL   Final    Special Requests BOTTLES DRAWN AEROBIC AND ANAEROBIC 5CC   Final    Culture NO GROWTH 4 DAYS   Final    Report Status PENDING   Incomplete     Studies/Results: No results found. Scheduled Meds:    . doxycycline  100 mg Oral Q12H  . fluconazole  150 mg Oral Once  . nystatin-triamcinolone   Topical BID  . piperacillin-tazobactam (ZOSYN)  IV  3.375 g Intravenous Q8H  . DISCONTD: vancomycin  1,000 mg Intravenous Q12H   Continuous Infusions:    . 0.9 % NaCl with KCl 40 mEq / L 50 mL/hr at 02/04/11 2123   PRN Meds:.acetaminophen, albuterol, HYDROmorphone, ondansetron (ZOFRAN) IV, ondansetron, oxyCODONE Assessment/Plan: Principal Problem:  *Pelvic abscess in female, right tuboovarian Coag negative staphylococcus in one out of two blood cultures: Agree this is contaminant. I recommend stopping vancomycin.  Dr. Sherrie Mustache will be available this week. I will sign off. Please reconsult if hospitalist assistance is required.    LOS: 4 days   Sheila [name] L 02/05/2011, 4:35 PM

## 2011-02-06 ENCOUNTER — Inpatient Hospital Stay (HOSPITAL_COMMUNITY): Payer: BC Managed Care – PPO | Admitting: Anesthesiology

## 2011-02-06 ENCOUNTER — Encounter (HOSPITAL_COMMUNITY)
Admission: EM | Disposition: A | Discharge status: Home / Self Care | Payer: Self-pay | Source: Home / Self Care | Attending: Obstetrics and Gynecology

## 2011-02-06 ENCOUNTER — Encounter (HOSPITAL_COMMUNITY): Payer: Self-pay | Admitting: Anesthesiology

## 2011-02-06 ENCOUNTER — Encounter (HOSPITAL_COMMUNITY): Payer: Self-pay | Admitting: *Deleted

## 2011-02-06 ENCOUNTER — Other Ambulatory Visit: Payer: Self-pay | Admitting: Obstetrics and Gynecology

## 2011-02-06 HISTORY — PX: SALPINGOOPHORECTOMY: SHX82

## 2011-02-06 HISTORY — PX: DILATION AND CURETTAGE OF UTERUS: SHX78

## 2011-02-06 HISTORY — PX: LAPAROSCOPY: SHX197

## 2011-02-06 LAB — CBC
Hemoglobin: 10 g/dL — ABNORMAL LOW (ref 12.0–15.0)
MCH: 28.9 pg (ref 26.0–34.0)
MCHC: 33.6 g/dL (ref 30.0–36.0)
Platelets: 451 10*3/uL — ABNORMAL HIGH (ref 150–400)

## 2011-02-06 LAB — TYPE AND SCREEN
ABO/RH(D): O POS
Antibody Screen: NEGATIVE

## 2011-02-06 LAB — CULTURE, BLOOD (ROUTINE X 2): Culture  Setup Time: 201210261356

## 2011-02-06 LAB — SURGICAL PCR SCREEN
MRSA, PCR: NEGATIVE
Staphylococcus aureus: NEGATIVE

## 2011-02-06 SURGERY — DILATION AND CURETTAGE
Anesthesia: General | Site: Vagina | Laterality: Right | Wound class: Dirty or Infected

## 2011-02-06 MED ORDER — GLYCOPYRROLATE 0.2 MG/ML IJ SOLN
INTRAMUSCULAR | Status: AC
  Filled 2011-02-06: qty 1

## 2011-02-06 MED ORDER — FENTANYL CITRATE 0.05 MG/ML IJ SOLN
25.0000 ug | INTRAMUSCULAR | Status: DC | PRN
  Administered 2011-02-06 (×3): 50 ug via INTRAVENOUS

## 2011-02-06 MED ORDER — KETOROLAC TROMETHAMINE 30 MG/ML IJ SOLN
30.0000 mg | Freq: Four times a day (QID) | INTRAMUSCULAR | Status: AC
  Filled 2011-02-06: qty 2

## 2011-02-06 MED ORDER — ONDANSETRON HCL 4 MG/2ML IJ SOLN: 4.0000 mg | Freq: Once | INTRAMUSCULAR | Status: DC | PRN

## 2011-02-06 MED ORDER — PROPOFOL 10 MG/ML IV EMUL
INTRAVENOUS | Status: DC | PRN
  Administered 2011-02-06: 160 mg via INTRAVENOUS

## 2011-02-06 MED ORDER — ONDANSETRON HCL 4 MG/2ML IJ SOLN: 4.0000 mg | Freq: Four times a day (QID) | INTRAMUSCULAR | Status: DC | PRN

## 2011-02-06 MED ORDER — LACTATED RINGERS IV SOLN
INTRAVENOUS | Status: DC
  Administered 2011-02-06 (×3): via INTRAVENOUS

## 2011-02-06 MED ORDER — PROMETHAZINE HCL 25 MG/ML IJ SOLN: 12.5000 mg | INTRAMUSCULAR | Status: DC | PRN

## 2011-02-06 MED ORDER — ONDANSETRON HCL 4 MG/2ML IJ SOLN
INTRAMUSCULAR | Status: AC
  Filled 2011-02-06: qty 2

## 2011-02-06 MED ORDER — ROCURONIUM BROMIDE 50 MG/5ML IV SOLN
INTRAVENOUS | Status: AC
  Filled 2011-02-06: qty 1

## 2011-02-06 MED ORDER — DOCUSATE SODIUM 100 MG PO CAPS
100.0000 mg | ORAL_CAPSULE | Freq: Every day | ORAL | Status: DC
  Administered 2011-02-07 – 2011-02-10 (×4): 100 mg via ORAL
  Filled 2011-02-06 (×4): qty 1

## 2011-02-06 MED ORDER — OXYCODONE-ACETAMINOPHEN 5-325 MG PO TABS
1.0000 | ORAL_TABLET | ORAL | Status: DC | PRN
  Administered 2011-02-08: 1 via ORAL
  Administered 2011-02-08 – 2011-02-09 (×4): 2 via ORAL
  Administered 2011-02-10: 1 via ORAL
  Filled 2011-02-06 (×4): qty 2
  Filled 2011-02-06 (×2): qty 1

## 2011-02-06 MED ORDER — FENTANYL CITRATE 0.05 MG/ML IJ SOLN
INTRAMUSCULAR | Status: DC | PRN
  Administered 2011-02-06: 150 ug via INTRAVENOUS
  Administered 2011-02-06 (×5): 50 ug via INTRAVENOUS
  Administered 2011-02-06: 100 ug via INTRAVENOUS
  Administered 2011-02-06: 50 ug via INTRAVENOUS

## 2011-02-06 MED ORDER — GLYCOPYRROLATE 0.2 MG/ML IJ SOLN
0.2000 mg | Freq: Once | INTRAMUSCULAR | Status: AC
  Administered 2011-02-06: 0.2 mg via INTRAVENOUS

## 2011-02-06 MED ORDER — KETOROLAC TROMETHAMINE 30 MG/ML IJ SOLN
INTRAMUSCULAR | Status: AC
  Filled 2011-02-06: qty 1

## 2011-02-06 MED ORDER — NALOXONE HCL 0.4 MG/ML IJ SOLN: 0.4000 mg | INTRAMUSCULAR | Status: DC | PRN

## 2011-02-06 MED ORDER — KETOROLAC TROMETHAMINE 30 MG/ML IJ SOLN
30.0000 mg | Freq: Once | INTRAMUSCULAR | Status: AC
  Administered 2011-02-06: 30 mg via INTRAVENOUS

## 2011-02-06 MED ORDER — MIDAZOLAM HCL 2 MG/2ML IJ SOLN
1.0000 mg | INTRAMUSCULAR | Status: DC | PRN
  Administered 2011-02-06: 2 mg via INTRAVENOUS

## 2011-02-06 MED ORDER — SODIUM CHLORIDE 0.9 % IR SOLN
Status: DC | PRN
  Administered 2011-02-06: 1000 mL

## 2011-02-06 MED ORDER — DIPHENHYDRAMINE HCL 12.5 MG/5ML PO ELIX: 12.5000 mg | ORAL_SOLUTION | Freq: Four times a day (QID) | ORAL | Status: DC | PRN

## 2011-02-06 MED ORDER — FENTANYL CITRATE 0.05 MG/ML IJ SOLN
INTRAMUSCULAR | Status: AC
  Filled 2011-02-06: qty 2

## 2011-02-06 MED ORDER — KCL IN DEXTROSE-NACL 20-5-0.45 MEQ/L-%-% IV SOLN
INTRAVENOUS | Status: DC
  Administered 2011-02-06 – 2011-02-08 (×5): via INTRAVENOUS

## 2011-02-06 MED ORDER — FENTANYL CITRATE 0.05 MG/ML IJ SOLN
INTRAMUSCULAR | Status: AC
  Administered 2011-02-06: 50 ug via INTRAVENOUS
  Filled 2011-02-06: qty 2

## 2011-02-06 MED ORDER — NEOSTIGMINE METHYLSULFATE 1 MG/ML IJ SOLN
INTRAMUSCULAR | Status: DC | PRN
  Administered 2011-02-06: 3 mg via INTRAVENOUS

## 2011-02-06 MED ORDER — ONDANSETRON HCL 4 MG/2ML IJ SOLN
4.0000 mg | Freq: Once | INTRAMUSCULAR | Status: AC
  Administered 2011-02-06: 4 mg via INTRAVENOUS

## 2011-02-06 MED ORDER — HYDROMORPHONE 0.3 MG/ML IV SOLN
INTRAVENOUS | Status: AC
  Filled 2011-02-06: qty 25

## 2011-02-06 MED ORDER — FENTANYL CITRATE 0.05 MG/ML IJ SOLN
INTRAMUSCULAR | Status: AC
  Administered 2011-02-06: 50 ug via INTRAVENOUS
  Filled 2011-02-06: qty 5

## 2011-02-06 MED ORDER — GLYCOPYRROLATE 0.2 MG/ML IJ SOLN
INTRAMUSCULAR | Status: DC | PRN
  Administered 2011-02-06: .4 mg via INTRAVENOUS

## 2011-02-06 MED ORDER — KETOROLAC TROMETHAMINE 30 MG/ML IJ SOLN
30.0000 mg | Freq: Four times a day (QID) | INTRAMUSCULAR | Status: AC
  Administered 2011-02-07 (×2): 30 mg via INTRAVENOUS

## 2011-02-06 MED ORDER — METHYLENE BLUE 1 % INJ SOLN
INTRAMUSCULAR | Status: AC
  Filled 2011-02-06: qty 1

## 2011-02-06 MED ORDER — DIPHENHYDRAMINE HCL 50 MG/ML IJ SOLN: 12.5000 mg | Freq: Four times a day (QID) | INTRAMUSCULAR | Status: DC | PRN

## 2011-02-06 MED ORDER — PROPOFOL 10 MG/ML IV EMUL
INTRAVENOUS | Status: AC
  Filled 2011-02-06: qty 20

## 2011-02-06 MED ORDER — LIDOCAINE HCL (PF) 1 % IJ SOLN
INTRAMUSCULAR | Status: AC
  Filled 2011-02-06: qty 30

## 2011-02-06 MED ORDER — SODIUM CHLORIDE 0.9 % IJ SOLN: 9.0000 mL | INTRAMUSCULAR | Status: DC | PRN

## 2011-02-06 MED ORDER — MIDAZOLAM HCL 2 MG/2ML IJ SOLN
INTRAMUSCULAR | Status: AC
  Filled 2011-02-06: qty 2

## 2011-02-06 MED ORDER — FENTANYL CITRATE 0.05 MG/ML IJ SOLN
INTRAMUSCULAR | Status: AC
  Filled 2011-02-06: qty 5

## 2011-02-06 MED ORDER — SODIUM CHLORIDE 0.9 % IJ SOLN
INTRAMUSCULAR | Status: AC
  Filled 2011-02-06: qty 20

## 2011-02-06 MED ORDER — LIDOCAINE HCL 1 % IJ SOLN
INTRAMUSCULAR | Status: DC | PRN
  Administered 2011-02-06: 30 mg via INTRADERMAL

## 2011-02-06 MED ORDER — HYDROMORPHONE 0.3 MG/ML IV SOLN
INTRAVENOUS | Status: DC
  Administered 2011-02-06: 16:00:00 via INTRAVENOUS
  Administered 2011-02-06: 0.9 mg via INTRAVENOUS
  Administered 2011-02-07: 1.2 mg via INTRAVENOUS
  Administered 2011-02-07: 4.19 mg via INTRAVENOUS
  Administered 2011-02-07 (×2): 1.2 mg via INTRAVENOUS
  Administered 2011-02-07: 08:00:00 via INTRAVENOUS
  Administered 2011-02-08: 1.2 mg via INTRAVENOUS

## 2011-02-06 MED ORDER — ROCURONIUM BROMIDE 100 MG/10ML IV SOLN
INTRAVENOUS | Status: DC | PRN
  Administered 2011-02-06: 40 mg via INTRAVENOUS
  Administered 2011-02-06: 10 mg via INTRAVENOUS
  Administered 2011-02-06: 20 mg via INTRAVENOUS

## 2011-02-06 MED ORDER — HYDROMORPHONE 0.3 MG/ML IV SOLN
INTRAVENOUS | Status: AC
  Administered 2011-02-06: 21:00:00
  Filled 2011-02-06: qty 25

## 2011-02-06 MED ORDER — SODIUM CHLORIDE 0.9 % IR SOLN
Status: DC | PRN
  Administered 2011-02-06: 3000 mL

## 2011-02-06 MED ORDER — SODIUM CHLORIDE 0.9 % IJ SOLN
INTRAMUSCULAR | Status: AC
  Filled 2011-02-06: qty 10

## 2011-02-06 MED ORDER — METHYLERGONOVINE MALEATE 0.2 MG/ML IJ SOLN
INTRAMUSCULAR | Status: AC
  Filled 2011-02-06: qty 1

## 2011-02-06 MED ORDER — PANTOPRAZOLE SODIUM 40 MG IV SOLR
40.0000 mg | Freq: Every day | INTRAVENOUS | Status: DC
  Administered 2011-02-06 – 2011-02-08 (×3): 40 mg via INTRAVENOUS
  Filled 2011-02-06 (×3): qty 40

## 2011-02-06 MED ORDER — ZOLPIDEM TARTRATE 5 MG PO TABS: 5.0000 mg | ORAL_TABLET | Freq: Every evening | ORAL | Status: DC | PRN

## 2011-02-06 MED ORDER — PIPERACILLIN-TAZOBACTAM 3.375 G IVPB
3.3750 g | Freq: Three times a day (TID) | INTRAVENOUS | Status: DC
  Administered 2011-02-06 – 2011-02-09 (×8): 3.375 g via INTRAVENOUS
  Filled 2011-02-06 (×12): qty 50

## 2011-02-06 MED ORDER — MIDAZOLAM HCL 5 MG/5ML IJ SOLN
INTRAMUSCULAR | Status: DC | PRN
  Administered 2011-02-06: 2 mg via INTRAVENOUS

## 2011-02-06 MED ORDER — LIDOCAINE HCL (PF) 1 % IJ SOLN
INTRAMUSCULAR | Status: AC
  Filled 2011-02-06: qty 5

## 2011-02-06 MED ORDER — LIDOCAINE-EPINEPHRINE (PF) 1 %-1:200000 IJ SOLN
INTRAMUSCULAR | Status: AC
  Filled 2011-02-06: qty 10

## 2011-02-06 SURGICAL SUPPLY — 98 items
APPLIER CLIP 13 LRG OPEN (CLIP)
APPLIER CLIP ROT 10 11.4 M/L (STAPLE) ×4
BAG HAMPER (MISCELLANEOUS) ×4 IMPLANT
BLADE SURG SZ11 CARB STEEL (BLADE) ×4 IMPLANT
CATH ROBINSON RED A/P 14FR (CATHETERS) IMPLANT
CELLS DAT CNTRL 66122 CELL SVR (MISCELLANEOUS) IMPLANT
CLIP APPLIE 13 LRG OPEN (CLIP) IMPLANT
CLIP APPLIE ROT 10 11.4 M/L (STAPLE) ×3 IMPLANT
CLOTH BEACON ORANGE TIMEOUT ST (SAFETY) ×4 IMPLANT
COVER LIGHT HANDLE STERIS (MISCELLANEOUS) ×8 IMPLANT
CURETTE VACUUM 9MM CVD CLR (CANNULA) IMPLANT
DECANTER SPIKE VIAL GLASS SM (MISCELLANEOUS) IMPLANT
DERMABOND ADVANCED (GAUZE/BANDAGES/DRESSINGS)
DERMABOND ADVANCED .7 DNX12 (GAUZE/BANDAGES/DRESSINGS) IMPLANT
DRAPE CAMERA CLOSED 9X96 (DRAPES) IMPLANT
DRAPE WARM FLUID 44X44 (DRAPE) IMPLANT
DRESSING COVERLET 3X1 FLEXIBLE (GAUZE/BANDAGES/DRESSINGS) IMPLANT
DRESSING TELFA 8X3 (GAUZE/BANDAGES/DRESSINGS) IMPLANT
ELECT REM PT RETURN 9FT ADLT (ELECTROSURGICAL) ×4
ELECTRODE REM PT RTRN 9FT ADLT (ELECTROSURGICAL) ×3 IMPLANT
FILTER SMOKE EVAC LAPAROSHD (FILTER) IMPLANT
FORMALIN 10 PREFIL 120ML (MISCELLANEOUS) ×8 IMPLANT
FORMALIN 10 PREFIL 480ML (MISCELLANEOUS) ×4 IMPLANT
GAUZE SPONGE 4X4 16PLY XRAY LF (GAUZE/BANDAGES/DRESSINGS) ×4 IMPLANT
GLOVE BIOGEL PI IND STRL 7.5 (GLOVE) ×6 IMPLANT
GLOVE BIOGEL PI IND STRL 9 (GLOVE) ×6 IMPLANT
GLOVE BIOGEL PI INDICATOR 7.5 (GLOVE) ×2
GLOVE BIOGEL PI INDICATOR 9 (GLOVE) ×2
GLOVE ECLIPSE 7.0 STRL STRAW (GLOVE) ×8 IMPLANT
GLOVE ECLIPSE 9.0 STRL (GLOVE) ×8 IMPLANT
GLOVE EXAM NITRILE MD LF STRL (GLOVE) ×8 IMPLANT
GLOVE INDICATOR STER SZ 9 (GLOVE) ×8 IMPLANT
GOWN BRE IMP SLV AUR XL STRL (GOWN DISPOSABLE) ×4 IMPLANT
GOWN STRL REIN 3XL LVL4 (GOWN DISPOSABLE) ×4 IMPLANT
INST SET LAPROSCOPIC GYN AP (KITS) ×4 IMPLANT
INST SET MAJOR GENERAL (KITS) ×4 IMPLANT
IV NS IRRIG 3000ML ARTHROMATIC (IV SOLUTION) ×4 IMPLANT
KIT BERKELEY 1ST TRIMESTER 3/8 (MISCELLANEOUS) IMPLANT
KIT ROOM TURNOVER AP CYSTO (KITS) IMPLANT
KIT ROOM TURNOVER APOR (KITS) ×4 IMPLANT
KIT TROCAR LAP GYN (TROCAR) ×4 IMPLANT
MANIFOLD NEPTUNE II (INSTRUMENTS) ×4 IMPLANT
MARKER SKIN DUAL TIP RULER LAB (MISCELLANEOUS) ×4 IMPLANT
NEEDLE HYPO 25X1 1.5 SAFETY (NEEDLE) ×4 IMPLANT
NEEDLE INSUFFLATION 14GA 120MM (NEEDLE) ×4 IMPLANT
NEEDLE SPNL 22GX3.5 QUINCKE BK (NEEDLE) ×4 IMPLANT
NS IRRIG 1000ML POUR BTL (IV SOLUTION) ×8 IMPLANT
PACK ABDOMINAL MAJOR (CUSTOM PROCEDURE TRAY) ×4 IMPLANT
PACK BASIC III (CUSTOM PROCEDURE TRAY) ×1
PACK PERI GYN (CUSTOM PROCEDURE TRAY) ×4 IMPLANT
PACK SRG BSC III STRL LF ECLPS (CUSTOM PROCEDURE TRAY) ×3 IMPLANT
PAD ARMBOARD 7.5X6 YLW CONV (MISCELLANEOUS) ×4 IMPLANT
POUCH SPECIMEN RETRIEVAL 10MM (ENDOMECHANICALS) IMPLANT
RETRACTOR WND ALEXIS 25 LRG (MISCELLANEOUS) ×3 IMPLANT
RTRCTR WOUND ALEXIS 18CM MED (MISCELLANEOUS)
RTRCTR WOUND ALEXIS 25CM LRG (MISCELLANEOUS) ×4
SCALPEL HARMONIC ACE (MISCELLANEOUS) ×4 IMPLANT
SCISSORS LAP 5X35 DISP (ENDOMECHANICALS) IMPLANT
SEALER TISSUE G2 CVD JAW 35 (ENDOMECHANICALS) ×3 IMPLANT
SEALER TISSUE G2 CVD JAW 45CM (ENDOMECHANICALS) ×1
SEPRAFILM MEMBRANE 5X6 (MISCELLANEOUS) ×4 IMPLANT
SET BASIN LINEN APH (SET/KITS/TRAYS/PACK) ×4 IMPLANT
SET BERKELEY SUCTION TUBING (SUCTIONS) IMPLANT
SET TUBE IRRIG SUCTION NO TIP (IRRIGATION / IRRIGATOR) ×4 IMPLANT
SHEARS HARMONIC 23CM COAG (MISCELLANEOUS) IMPLANT
SOL PREP PROV IODINE SCRUB 4OZ (MISCELLANEOUS) ×4 IMPLANT
SOLUTION ANTI FOG 6CC (MISCELLANEOUS) ×4 IMPLANT
SPONGE GAUZE 4X4 12PLY (GAUZE/BANDAGES/DRESSINGS) ×16 IMPLANT
SPONGE LAP 18X18 X RAY DECT (DISPOSABLE) ×8 IMPLANT
STAPLER VISISTAT 35W (STAPLE) ×4 IMPLANT
STRIP CLOSURE SKIN 1/2X4 (GAUZE/BANDAGES/DRESSINGS) ×16 IMPLANT
STRIP CLOSURE SKIN 1/4X3 (GAUZE/BANDAGES/DRESSINGS) ×4 IMPLANT
SUT CHROMIC 0 CT 1 (SUTURE) ×4 IMPLANT
SUT MON AB 3-0 SH 27 (SUTURE) IMPLANT
SUT VIC AB 0 CT1 27 (SUTURE)
SUT VIC AB 0 CT1 27XBRD ANTBC (SUTURE) IMPLANT
SUT VIC AB 0 CTX 36 (SUTURE)
SUT VIC AB 0 CTX36XBRD ANTBCTR (SUTURE) IMPLANT
SUT VIC AB 4-0 PS2 27 (SUTURE) ×4 IMPLANT
SUT VICRYL 0 UR6 27IN ABS (SUTURE) ×4 IMPLANT
SUT VICRYL 3 0 (SUTURE) IMPLANT
SWAB CULTURE LIQ STUART DBL (MISCELLANEOUS) ×4 IMPLANT
SYR BULB IRRIGATION 50ML (SYRINGE) ×4 IMPLANT
SYR CONTROL 10ML LL (SYRINGE) ×4 IMPLANT
SYRINGE 10CC LL (SYRINGE) ×4 IMPLANT
TAPE CLOTH SURG 4X10 WHT LF (GAUZE/BANDAGES/DRESSINGS) ×16 IMPLANT
TOWEL OR 17X26 4PK STRL BLUE (TOWEL DISPOSABLE) IMPLANT
TRAP SPECIMEN MUCOUS 40CC (MISCELLANEOUS) ×4 IMPLANT
TRAY FOLEY CATH 14FR (SET/KITS/TRAYS/PACK) ×4 IMPLANT
TRAY FOLEY CATH 16FR SILVER (SET/KITS/TRAYS/PACK) IMPLANT
TROCAR Z-THREAD SLEEVE 11X100 (TROCAR) ×4 IMPLANT
TUBE ANAEROBIC PORT A CUL  W/M (MISCELLANEOUS) ×4 IMPLANT
TUBING DYE INJECTION 900-617 (MISCELLANEOUS) IMPLANT
TUBING INSUFFLATION 10FT LAP (TUBING) ×4 IMPLANT
VACURETTE 10MM (CANNULA) IMPLANT
VACURETTE 12MM (CANNULA) IMPLANT
VACURETTE 14MM (CANNULA) IMPLANT
VACURETTE 8MM (CANNULA) IMPLANT

## 2011-02-06 NOTE — Op Note (Signed)
12/02/2010  9:29 AM  PATIENT:    PRE-OPERATIVE DIAGNOSIS: Right tubo-ovarian abscess, rule out endometrial abnormality  POST-OPERATIVE DIAGNOSIS:  Same  PROCEDURE:  Procedure(s): Laparoscopy converted to laparotomy, right salpingo-oophorectomy, dilation and curettage  SURGEON:Khasir Woodrome Benancio Deeds, MD  PHYSICIAN ASSISTANT: None  ADDITIONAL ASSISTANTS: Amie Critchley CST-FA, Louie Casa RN FA   ANESTHESIA:   general  ESTIMATED BLOOD LOSS: 650 cc   BLOOD ADMINISTERED:none  DRAINS: none   LOCAL MEDICATIONS USED:  NONE  SPECIMEN:  Source of Specimen:  Right tube and ovary, endometrial curettings  DISPOSITION OF SPECIMEN:  PATHOLOGY  COUNTS:  YES  TOURNIQUET:  * No tourniquets in log *  DICTATION #: Patient was taken operating room, prepped and draped in usual fashion lower surgery lithotomy like support was in place and Foley catheter inserted. Timeout was conducted. The patient received antibiotics immediately prior to procedure. Speculum was inserted and dilation and curettage performed in standard fashion by grasping the cervix, dilating the cervix to 19 French, gently curetting the endometrial cavity and sending the specimen. After read bringing down attention was directed to the umbilicus and Veress needle placed through the umbilicus pneumoperitoneum achieved and laparoscopic trocar introduced easily. Suprapubic trocar was placed without difficulty as were right and left lower quadrant trochars. With the patient in Trendelenburg position, the bowel could be manipulated away from the right adnexa which was swollen and adherent to the sidewall. Sigmoid epiploic fat was adherent to the medial side of the right tubo-ovarian abscess but easily was freed up. Purulence was encountered at the base of the right utero-ovarian ligament and aspirated and sent as specimen the abscess was further decompressed with aspiration removing 20 cc of urine the material. Harmonic scalpel was used to coagulate  across the right infundibulopelvic ligament, but sufficient bleeding was encountered that visualization became inadequate. After several attempts at aspiration, but finding: The blood to thick to be aspirated with the suction aspirator, decision was made to proceed with open surgery to patient was quickly converted to an open procedure by a Pfannenstiel type incision attention directed the right adnexa. Pressure was placed on the right IP ligament while suction and irrigation removed the blood in the pelvis. The bleeding site was identifiable, and carefully grasped with a clamp and carefully suture ligated. The ureter was well away from the surgical area. The ovary could be bluntly dissected free from the sidewall, and remainder of the right IP ligament isolated crossclamped in 2 segments and suture ligated with 0 chromic. The very thickened right utero-ovarian ligament was similarly clamped cut and suture ligated in several small bites. Pelvis was inspected and irrigated thoroughly. 5 minutes with gauze in place was performed and pelvis reinspected. Hemostasis was good. Illness was irrigated copiously x2 number removing laparotomy tapes and irrigating the upper abdomen as well. Pelvis was dried, and Seprafilm placed in the right adnexa and over the uterine fundus bowel was repositioned, its anatomic location antrotomy instruments removed, and anterior peritoneum closed with 2-0 Vicryl, the fascia closed with 0 Vicryl, the subcutaneous fatty tissues were also irrigated and reapproximated with 2-0 Vicryl followed by her the skin laparoscopic trocar sites on each side were closed with staples and the 0 chromic fascial closure the umbilicus performed under direct visualization, and subcuticular skin closure performed sponge and needle counts correct condition to recovery room in stable. Hematocrit will be checked in recovery room.  PLAN OF CARE: Inpatient care x2-3 days  PATIENT DISPOSITION:  PACU -  hemodynamically stable.   Delay start  of Pharmacological VTE agent (>24hrs) due to surgical blood loss or risk of bleeding:  not applicable

## 2011-02-06 NOTE — Transfer of Care (Signed)
Immediate Anesthesia Transfer of Care Note  Patient: Sheila [name]  Procedure(s) Performed:  DILATATION AND CURETTAGE (D&C); SALPINGO OOPHERECTOMY - procedure started at 1241; LAPAROSCOPIC SALPINGO OOPHERECTOMY  Patient Location: PACU  Anesthesia Type: General  Level of Consciousness: awake, alert , oriented and patient cooperative  Airway & Oxygen Therapy: Patient Spontanous Breathing and Patient connected to face mask oxygen  Post-op Assessment: Report given to PACU RN, Post -op Vital signs reviewed and stable, Patient moving all extremities and Patient moving all extremities X 4  Post vital signs: Reviewed and stable  Complications: No apparent anesthesia complications

## 2011-02-06 NOTE — Progress Notes (Signed)
Day of Surgery Procedure(s): DILATATION AND CURETTAGE (D&C) SALPINGO OOPHERECTOMY LAPAROSCOPIC SALPINGO OOPHERECTOMY  Subjective: Patient reports incisional pain., but thinks it is less than preop pain. Denies major pain. Taking po ice chips, no nausea, no vomiting.  Has been out of bed and walked.   Objective: I have reviewed patient's vital signs and intake and output. Generous clear urine in foley bag. Slight pink tinge .  GI: incision: clean and dry  Assessment: s/p Procedure(s): DILATATION AND CURETTAGE (D&C) SALPINGO OOPHERECTOMY LAPAROSCOPIC SALPINGO OOPHERECTOMY: stable  Plan: Clear liquids, continue abx, reassess in a.m.  CBC, Bmet  In am ordered  LOS: 5 days    Sheila [name] 02/06/2011, 10:15 PM

## 2011-02-06 NOTE — Progress Notes (Signed)
Subjective: Patient reports + BM x 3 this am, no nausea , no vomiting. Pain in rlq stable, no increase or decrease    Objective: I have reviewed patient's vital signs and medications.  General: alert, no distress and mildly anxious GI: normal findings: bowel sounds normal and mild rlq tenderness, no rebound   Assessment/Plan: Right tuboovarian abscess.   Laparoscopy with removal of right adnexal abscess , likely removal of tube and ovary. Will also do endometrial biopsy due to slight thickening of endometrium.  LOS: 5 days    Sheila [name] V 02/06/2011, 8:21 AM

## 2011-02-06 NOTE — Brief Op Note (Signed)
02/01/2011 - 02/06/2011  1:55 PM  PATIENT:  Sheila [name]  45 y.o. female  PRE-OPERATIVE DIAGNOSIS:  Right tuboovarian abscess  POST-OPERATIVE DIAGNOSIS:  right tuboovarian abscess  PROCEDURE:  Procedure(s): DILATATION AND CURETTAGE (D&C) SALPINGO OOPHERECTOMY Diagnostic laparoscopy  SURGEON:  Surgeon(s): Tilda Burrow, MD  PHYSICIAN ASSISTANT:   ASSISTANTS: Pervis Hocking RN FA, Amie Critchley, CST-FA   ANESTHESIA:   general  EBL:  Total I/O In: 2300 [I.V.:2300] Out: 950 [Urine:300; Blood:650]  BLOOD ADMINISTERED:none  DRAINS: Urinary Catheter (Foley)   LOCAL MEDICATIONS USED:  NONE  SPECIMEN:  Source of Specimen:  right tube and ovary, endometrial currettings  DISPOSITION OF SPECIMEN:  PATHOLOGY  COUNTS:  YES  TOURNIQUET:  * No tourniquets in log *  DICTATION: .Dragon Dictation  PLAN OF CARE: continue inpt care  PATIENT DISPOSITION:  PACU - hemodynamically stable.   Delay start of Pharmacological VTE agent (>24hrs) due to surgical blood loss or risk of bleeding:  not applicable

## 2011-02-06 NOTE — Anesthesia Postprocedure Evaluation (Addendum)
  Anesthesia Post-op Note  Patient: Sheila [name]  Procedure(s) Performed:  DILATATION AND CURETTAGE (D&C); SALPINGO OOPHERECTOMY - procedure started at 1241; LAPAROSCOPIC SALPINGO OOPHERECTOMY  Patient Location: PACU  Anesthesia Type: General  Level of Consciousness: awake, alert , oriented and patient cooperative  Airway and Oxygen Therapy: Patient Spontanous Breathing  Post-op Pain: 3 /10, mild  Post-op Assessment: Post-op Vital signs reviewed, Patient's Cardiovascular Status Stable, Respiratory Function Stable, Patent Airway and No signs of Nausea or vomiting  Post-op Vital Signs: Reviewed and stable  Complications: No apparent anesthesia complications  10 30 2012 @ 0800 Doing Well in Room #325. Spirits Good. Had a good night. Spirits good. No anesthetic Issues.

## 2011-02-06 NOTE — Anesthesia Preprocedure Evaluation (Addendum)
Anesthesia Evaluation  Patient identified by MRN, date of birth, ID band Patient awake  General Assessment Comment  Reviewed: Allergy & Precautions, H&P , NPO status , Patient's Chart, lab work & pertinent test results  History of Anesthesia Complications Negative for: history of anesthetic complications  Airway Mallampati: II      Dental  (+) Teeth Intact   Pulmonary  clear to auscultation        Cardiovascular Regular Normal    Neuro/Psych  Headaches,    GI/Hepatic   Endo/Other    Renal/GU      Musculoskeletal   Abdominal   Peds  Hematology   Anesthesia Other Findings   Reproductive/Obstetrics                           Anesthesia Physical Anesthesia Plan  ASA: I  Anesthesia Plan: General   Post-op Pain Management:    Induction: Intravenous  Airway Management Planned: Oral ETT  Additional Equipment:   Intra-op Plan:   Post-operative Plan: Extubation in OR  Informed Consent: I have reviewed the patients History and Physical, chart, labs and discussed the procedure including the risks, benefits and alternatives for the proposed anesthesia with the patient or authorized representative who has indicated his/her understanding and acceptance.     Plan Discussed with: CRNA  Anesthesia Plan Comments:        Anesthesia Quick Evaluation

## 2011-02-06 NOTE — Anesthesia Procedure Notes (Signed)
Procedures

## 2011-02-07 ENCOUNTER — Inpatient Hospital Stay (HOSPITAL_COMMUNITY): Payer: BC Managed Care – PPO

## 2011-02-07 LAB — BASIC METABOLIC PANEL
CO2: 28 mEq/L (ref 19–32)
GFR calc non Af Amer: 74 mL/min — ABNORMAL LOW (ref >90)
Glucose, Bld: 94 mg/dL (ref 70–99)
Potassium: 4.3 mEq/L (ref 3.5–5.1)
Sodium: 137 mEq/L (ref 135–145)

## 2011-02-07 LAB — CBC
Hemoglobin: 9.5 g/dL — ABNORMAL LOW (ref 12.0–15.0)
RBC: 3.32 MIL/uL — ABNORMAL LOW (ref 3.87–5.11)

## 2011-02-07 MED ORDER — HYDROMORPHONE 0.3 MG/ML IV SOLN
INTRAVENOUS | Status: AC
  Administered 2011-02-07: 1.2 mg via INTRAVENOUS
  Filled 2011-02-07: qty 25

## 2011-02-07 MED ORDER — SODIUM CHLORIDE 0.9 % IV SOLN
Freq: Once | INTRAVENOUS | Status: AC
  Administered 2011-02-07: 20:00:00 via INTRAVENOUS

## 2011-02-07 MED ORDER — HYDROMORPHONE 0.3 MG/ML IV SOLN
INTRAVENOUS | Status: AC
  Filled 2011-02-07: qty 25

## 2011-02-07 NOTE — Progress Notes (Signed)
1 Day Post-Op Procedure(s): DILATATION AND CURETTAGE (D&C) SALPINGO OOPHERECTOMY, open LAPAROSCOPY DIAGNOSTIC  Subjective: Patient reports incisional pain, described as soreness only. Denies back pain or lateralizing pain. Has been on ice chips so far  Objective: I have reviewed patient's vital signs, intake and output and labs. She is afebrile, with mild tachycardia this a.m. Urine output was 300 cc over 12 hrs before foley d/c'd this a.m. I/O +2000+ yersterday, but doesn't include intraop ebl of 650 cc. EGFR dimished slightly, and Cr 0.93.    General: alert, no distress and minimal discomfort, controlled well via infrequent PCA use. GI: normal findings: dressing dry, no bruising of skin. abd soft, and abnormal findings:  absent bowel sounds and slight fullness, no distention.  Nontender. Extremities: extremities normal, atraumatic, no cyanosis or edema and skin dry, suggests mild fluid deficit.  Assessment: s/p Procedure(s): DILATATION AND CURETTAGE (D&C) SALPINGO OOPHERECTOMY LAPAROSCOPY DIAGNOSTIC: stable and mild fluid deficit past surgerey.  Given patient's right adnexal surgery, will obtain renal u/s to insure no compromise to renal outflow on Rt  Plan: Advance diet to clear liquids. Continue IV antibiotics.  LOS: 6 days    Fue Cervenka V 02/07/2011, 11:19 AM

## 2011-02-07 NOTE — Progress Notes (Signed)
1 Day Post-Op Procedure(s): DILATATION AND CURETTAGE (D&C) SALPINGO OOPHERECTOMY LAPAROSCOPY DIAGNOSTIC  Subjective: Patient reports tolerating PO.  Only voided once today denies pain. Some growling abd discomfort  Objective: I have reviewed patient's vital signs, intake and output and radiology results. Renal u/s normal, no suspicion of hydronephrosis on rt, , indicating likely normal ureteral fxn.     Assessment: s/p Procedure(s): DILATATION AND CURETTAGE (D&C) SALPINGO OOPHERECTOMY LAPAROSCOPY DIAGNOSTIC : continued mild dehydration and fluid deficit. will give 500 cc NS bolus  Plan: fliid bolus  LOS: 6 days    Sheila [name] V 02/07/2011, 7:12 PM

## 2011-02-07 NOTE — Addendum Note (Signed)
Addendum  created 02/07/11 1610 by Marylene Buerger, CRNA   Modules edited:Notes Section

## 2011-02-08 LAB — DIFFERENTIAL
Eosinophils Absolute: 0.3 10*3/uL (ref 0.0–0.7)
Lymphocytes Relative: 10 % — ABNORMAL LOW (ref 12–46)
Lymphs Abs: 1.6 10*3/uL (ref 0.7–4.0)
Monocytes Relative: 6 % (ref 3–12)
Neutrophils Relative %: 82 % — ABNORMAL HIGH (ref 43–77)

## 2011-02-08 LAB — BASIC METABOLIC PANEL
BUN: 7 mg/dL (ref 6–23)
CO2: 24 mEq/L (ref 19–32)
Chloride: 96 mEq/L (ref 96–112)
GFR calc non Af Amer: >90 mL/min (ref >90)
Glucose, Bld: 107 mg/dL — ABNORMAL HIGH (ref 70–99)
Potassium: 3.7 mEq/L (ref 3.5–5.1)

## 2011-02-08 LAB — CBC
Hemoglobin: 8.2 g/dL — ABNORMAL LOW (ref 12.0–15.0)
MCH: 28.9 pg (ref 26.0–34.0)
RBC: 2.84 MIL/uL — ABNORMAL LOW (ref 3.87–5.11)

## 2011-02-08 MED ORDER — SODIUM CHLORIDE 0.9 % IJ SOLN
INTRAMUSCULAR | Status: AC
  Filled 2011-02-08: qty 10

## 2011-02-08 MED ORDER — DOXYCYCLINE HYCLATE 100 MG PO TABS
100.0000 mg | ORAL_TABLET | Freq: Two times a day (BID) | ORAL | Status: DC
  Administered 2011-02-08 – 2011-02-10 (×5): 100 mg via ORAL
  Filled 2011-02-08 (×5): qty 1

## 2011-02-08 NOTE — Progress Notes (Signed)
2 Days Post-Op Procedure(s): DILATATION AND CURETTAGE (D&C) SALPINGO OOPHERECTOMY LAPAROSCOPY DIAGNOSTIC  Subjective: Patient reports no problems voiding.  Voided q 1h thru night. No flatus, no bm,  + borborygmi. No pain. Ambulating.  Objective: I have reviewed patient's vital signs and labs. Temp:  [98.5 F (36.9 C)-99.8 F (37.7 C)] 98.5 F (36.9 C) (10/31 0530) Pulse Rate:  [114-218] 114  (10/31 0530) Resp:  [16-20] 20  (10/31 0530) BP: (111-136)/(75-88) 136/85 mmHg (10/31 0530) SpO2:  [82 %-98 %] 96 % (10/31 0530) FiO2 (%):  [96 %] 96 % (10/31 0750) Weight:  [88.4 kg (194 lb 14.2 oz)] 194 lb 14.2 oz (88.4 kg) (10/31 4098)  General: alert, no distress and pale GI: soft, non-tender; bowel sounds normal; no masses,  no organomegaly, normal findings: bowel sounds normal and noted in all 4 quadrants, abnormal findings:  mild bloating still and  Extremities: Homans sign is negative, no sign of DVT  Assessment: s/p Procedure(s): DILATATION AND CURETTAGE (D&C) SALPINGO OOPHERECTOMY LAPAROSCOPY DIAGNOSTIC: stable, progressing well and tolerating diet, low grade fever and leukocytosis persist  Plan:  Advance diet Advance to PO medication add po doxycycline. increase ambulation, reduce iv fluids,  continue iv zosyn  LOS: 7 days    Sheila [name] V 02/08/2011, 8:36 AM

## 2011-02-09 LAB — CULTURE, ROUTINE-ABSCESS

## 2011-02-09 MED ORDER — CIPROFLOXACIN HCL 250 MG PO TABS
500.0000 mg | ORAL_TABLET | Freq: Two times a day (BID) | ORAL | Status: DC
  Administered 2011-02-09 – 2011-02-10 (×3): 500 mg via ORAL
  Filled 2011-02-09 (×3): qty 2

## 2011-02-09 NOTE — Progress Notes (Signed)
3 Days Post-Op Procedure(s): DILATATION AND CURETTAGE (D&C) SALPINGO OOPHERECTOMY LAPAROSCOPY DIAGNOSTIC  Subjective: Patient reports tolerating PO, + BM and no problems voiding.she is concerned re fatigue with activity, and apprehensive re activity  Low grade temp persists temp 99+  Objective:  vital signs, medications, labs and pathology General: alert, cooperative and anxious GI: soft, non-tender; bowel sounds normal; no masses,  no organomegaly and incision: clean, dry and intact  Assessment: s/p Procedure(s): DILATATION AND CURETTAGE (D&C) SALPINGO OOPHERECTOMY LAPAROSCOPY DIAGNOSTIC: stable and fever  Plan: Advance diet Advance to PO medication Discontinue IV fluids keep til am, recheck cbc on anticipated outpt regimen  LOS: 8 days    Sheila [name] V 02/09/2011, 1:09 PM

## 2011-02-10 ENCOUNTER — Encounter (HOSPITAL_COMMUNITY): Payer: Self-pay | Admitting: Obstetrics and Gynecology

## 2011-02-10 MED ORDER — DOXYCYCLINE HYCLATE 100 MG PO TABS: 100.0000 mg | ORAL_TABLET | Freq: Two times a day (BID) | ORAL | Status: AC

## 2011-02-10 MED ORDER — DSS 100 MG PO CAPS: 100.0000 mg | ORAL_CAPSULE | Freq: Every day | ORAL | Status: AC

## 2011-02-10 MED ORDER — HYDROCODONE-ACETAMINOPHEN 5-500 MG PO TABS: 1.0000 | ORAL_TABLET | ORAL | Status: AC | PRN

## 2011-02-10 MED ORDER — CIPROFLOXACIN HCL 500 MG PO TABS: 500.0000 mg | ORAL_TABLET | Freq: Two times a day (BID) | ORAL | Status: DC

## 2011-02-10 NOTE — Progress Notes (Signed)
Discharge Summary: a/o.vss. Up ad lib. Incision CDI. Prescriptions given. Discharge instructions given. Pt verbalized understanding of instructions. Left floor via wheelchair with nursing staff and family member.

## 2011-02-11 LAB — ANAEROBIC CULTURE

## 2011-02-15 NOTE — Discharge Summary (Signed)
Physician Discharge Summary   Patient ID: Sheila [name] [id number] 45 y.o. Dec 08, 1965  Admit date: 02/01/2011  Discharge date and time: 02/10/2011 11:00 AM   Admitting Physician: Tilda Burrow, MD Osvaldo Shipper MD Discharge Physician: Emelda Fear Consulting physician Tilford Pillar M.D. Admission Diagnoses: Abscess of abdominal cavity [567.22] abd pain possible Procedure right salpingo-oophorectomy Discharge Diagnoses: Right ovarian abscess (right tubo-ovarian abscess)  Admission Condition: fair Discharged Condition: stable, improved  Hospital Course: This 45 year old female admitted for suspected right pelvic abscess possibly involving the ovary was admitted for intravenous antibiotics and consultation with general surgery and OB/GYN.. The abscess showed air and fluid and a 5 cm mass in the center portion of the ovary after 3 days of antibiotics the patient's right-sided pain persisted possibly worsening and surgery was recommended leukocytosis persistent. Laparoscopy was attempted but due to intraoperative bleeding was converted to laparotomy and right salpingo-oophorectomy was performed. With 500 cc estimated blood loss.  Diminished urinary output on day one was attributed to third spacing of fluids. Renal ultrasound confirmed normal ureters and kidneys patient progressed steadily and was able to be managed as outpatient again beginning 02/01/2011  Consults: general surgery and gynecology  Significant Diagnostic Studies: labs: Postop hemoglobin 10 and radiology: CT scan: Right sided 5 cm in tubo-ovarian abscess  Treatments: surgery: Right salpingo-oophorectomy  Discharge Exam: BP 148/87  Pulse 95  Temp(Src) 98.2 F (36.8 C) (Oral)  Resp 18  Ht 5\' 5"  (1.651 m)  Wt 84.4 kg (186 lb 1.1 oz)  BMI 30.96 kg/m2  SpO2 97%  LMP 01/25/2011  General Appearance:    Alert, cooperative, no distress, appears stated age  Head:    Normocephalic, without obvious abnormality, atraumatic    Eyes:    PERRL, conjunctiva/corneas clear, EOM's intact, fundi    benign, both eyes  Ears:    Normal TM's and external ear canals, both ears  Nose:   Nares normal, septum midline, mucosa normal, no drainage    or sinus tenderness  Throat:   Lips, mucosa, and tongue normal; teeth and gums normal  Neck:   Supple, symmetrical, trachea midline, no adenopathy;    thyroid:  no enlargement/tenderness/nodules; no carotid   bruit or JVD  Back:     Symmetric, no curvature, ROM normal, no CVA tenderness  Lungs:     Clear to auscultation bilaterally, respirations unlabored  Chest Wall:    No tenderness or deformity   Heart:    Regular rate and rhythm, S1 and S2 normal, no murmur, rub   or gallop  Breast Exam:    No tenderness, masses, or nipple abnormality  Abdomen:     Soft, non-tender, bowel sounds active all four quadrants,    no masses, no organomegaly  Genitalia:    Normal female without lesion, discharge or tenderness  Rectal:    Normal tone, normal prostate, no masses or tenderness;   guaiac negative stool  Extremities:   Extremities normal, atraumatic, no cyanosis or edema  Pulses:   2+ and symmetric all extremities  Skin:   Skin color, texture, turgor normal, no rashes or lesions  Lymph nodes:   Cervical, supraclavicular, and axillary nodes normal  Neurologic:   CNII-XII intact, normal strength, sensation and reflexes    throughout    Disposition: Home or Self Care  Patient Instructions:  Discharge Medication List as of 02/10/2011 10:27 AM    START taking these medications   Details  docusate sodium 100 MG CAPS Take 100 mg by mouth daily., Starting  02/10/2011, Until Mon 02/20/11, Print    doxycycline (VIBRA-TABS) 100 MG tablet Take 1 tablet (100 mg total) by mouth every 12 (twelve) hours., Starting 02/10/2011, Until Mon 02/20/11, Print    HYDROcodone-acetaminophen (VICODIN) 5-500 MG per tablet Take 1 tablet by mouth every 4 (four) hours as needed for pain., Starting 02/10/2011, Until  Mon 02/20/11, Print      CONTINUE these medications which have CHANGED   Details  ciprofloxacin (CIPRO) 500 MG tablet Take 1 tablet (500 mg total) by mouth 2 (two) times daily., Starting 02/10/2011, Until Mon 02/20/11, Print      CONTINUE these medications which have NOT CHANGED   Details  ibuprofen (ADVIL,MOTRIN) 800 MG tablet Take 800 mg by mouth every 6 (six) hours as needed. For pain , Until Discontinued, Historical Med      STOP taking these medications     metroNIDAZOLE (FLAGYL) 500 MG tablet        Activity: no lifting, driving, or strenuous exercise for 6 weeks, no driving for 2 weeks and no sex for 6 weeks weeks Diet: regular diet Wound Care: keep wound clean and dry and as directed  Follow-up with Sherryl Manges in 2 weeks. Discharge medications Cipro and doxycycline  Signed: Tilda Burrow 02/15/2011 5:27 PM

## 2011-02-18 ENCOUNTER — Inpatient Hospital Stay (HOSPITAL_COMMUNITY): Payer: BC Managed Care – PPO

## 2011-02-18 ENCOUNTER — Inpatient Hospital Stay (HOSPITAL_COMMUNITY)
Admission: AD | Admit: 2011-02-18 | Discharge: 2011-02-25 | DRG: 415 | Disposition: A | Discharge status: Home / Self Care | Payer: BC Managed Care – PPO | Source: Ambulatory Visit | Attending: Obstetrics & Gynecology | Admitting: Obstetrics & Gynecology

## 2011-02-18 DIAGNOSIS — D649 Anemia, unspecified: Secondary | ICD-10-CM | POA: Diagnosis present

## 2011-02-18 DIAGNOSIS — N731 Chronic parametritis and pelvic cellulitis: Secondary | ICD-10-CM | POA: Diagnosis present

## 2011-02-18 DIAGNOSIS — T8140XA Infection following a procedure, unspecified, initial encounter: Principal | ICD-10-CM | POA: Diagnosis present

## 2011-02-18 DIAGNOSIS — N739 Female pelvic inflammatory disease, unspecified: Secondary | ICD-10-CM

## 2011-02-18 DIAGNOSIS — Y838 Other surgical procedures as the cause of abnormal reaction of the patient, or of later complication, without mention of misadventure at the time of the procedure: Secondary | ICD-10-CM | POA: Diagnosis present

## 2011-02-18 LAB — URINALYSIS, ROUTINE W REFLEX MICROSCOPIC
Leukocytes, UA: NEGATIVE
Nitrite: NEGATIVE
Specific Gravity, Urine: >1.03 — ABNORMAL HIGH (ref 1.005–1.030)
Urobilinogen, UA: 0.2 mg/dL (ref 0.0–1.0)
pH: 5.5 (ref 5.0–8.0)

## 2011-02-18 LAB — URINE MICROSCOPIC-ADD ON

## 2011-02-18 LAB — DIFFERENTIAL
Basophils Absolute: 0 10*3/uL (ref 0.0–0.1)
Lymphocytes Relative: 10 % — ABNORMAL LOW (ref 12–46)
Neutro Abs: 14 10*3/uL — ABNORMAL HIGH (ref 1.7–7.7)
Neutrophils Relative %: 83 % — ABNORMAL HIGH (ref 43–77)

## 2011-02-18 LAB — CBC
HCT: 30.3 % — ABNORMAL LOW (ref 36.0–46.0)
Platelets: 604 10*3/uL — ABNORMAL HIGH (ref 150–400)
RDW: 13 % (ref 11.5–15.5)
WBC: 16.9 10*3/uL — ABNORMAL HIGH (ref 4.0–10.5)

## 2011-02-18 MED ORDER — PIPERACILLIN-TAZOBACTAM 3.375 G IVPB
3.3750 g | Freq: Three times a day (TID) | INTRAVENOUS | Status: DC
  Administered 2011-02-19 – 2011-02-25 (×19): 3.375 g via INTRAVENOUS
  Filled 2011-02-18 (×21): qty 50

## 2011-02-18 MED ORDER — HYDROCODONE-ACETAMINOPHEN 5-325 MG PO TABS
1.0000 | ORAL_TABLET | Freq: Four times a day (QID) | ORAL | Status: DC | PRN
  Administered 2011-02-19 – 2011-02-22 (×4): 1 via ORAL
  Filled 2011-02-18 (×3): qty 1

## 2011-02-18 MED ORDER — IOHEXOL 300 MG/ML  SOLN
100.0000 mL | Freq: Once | INTRAMUSCULAR | Status: AC | PRN
  Administered 2011-02-18: 100 mL via INTRAVENOUS

## 2011-02-18 MED ORDER — LACTATED RINGERS IV SOLN
INTRAVENOUS | Status: DC
  Administered 2011-02-18 – 2011-02-24 (×9): via INTRAVENOUS

## 2011-02-18 MED ORDER — IBUPROFEN 800 MG PO TABS
800.0000 mg | ORAL_TABLET | Freq: Four times a day (QID) | ORAL | Status: DC | PRN
  Administered 2011-02-19 – 2011-02-24 (×9): 800 mg via ORAL
  Filled 2011-02-18 (×10): qty 1

## 2011-02-18 NOTE — Progress Notes (Signed)
Pt states, " I had a right TOA on Nov 1st, and was at [facility]. I've had a fever for the past fever days. It got up to 101.0 today. The pain was worse for the past few days but not as bad today, and I'm just taking Ibuprofen 800 mg. It hurts a little after I pee but it is the same as when I was in the hospital, but they had just removed the catheter."

## 2011-02-18 NOTE — H&P (Signed)
Subjective:     Sheila [name] is a 45 y.o. female who presents for evaluation of a post op fever of 101 today. This is 10 days after she had a laparoscopy and then laparatomy and USO for a "abscess in my tube". Her pain is well-controled with IBU and occasionally Vicodin. She denies nausea/vomitting and is tolerating po well.  Patient History:  The following portions of the patient's history were reviewed and updated as appropriate: allergies, current medications, past family history, past medical history, past social history, past surgical history and problem list.  Review of Systems A comprehensive review of systems was negative.    Objective:    Pulse 133  Temp(Src) 99.1 F (37.3 C) (Oral)  Resp 20  Ht 5\' 5"  (1.651 m)  Wt 79.153 kg (174 lb 8 oz)  BMI 29.04 kg/m2  LMP 01/25/2011  General:  alert  Skin:  normal  Eyes: conjunctivae/corneas clear. PERRL, EOM's intact. Fundi benign.  Mouth: MMM no lesions  Lymph Nodes:  Cervical, supraclavicular, and axillary nodes normal.  Lungs:  clear to auscultation bilaterally  Heart:  regular rate and rhythm, S1, S2 normal, no murmur, click, rub or gallop  Abdomen: soft, non-tender; bowel sounds normal; no masses,  no organomegaly  CVA:  absent  Genitourinary: defer exam  Extremities:  extremities normal, atraumatic, no cyanosis or edema  Neurologic:  negative  Psychiatric:  normal mood, behavior, speech, dress, and thought processes     Assessment:    10 cm pelvic abscess   Plan:   She will be admitted and receive IV ABX.

## 2011-02-19 DIAGNOSIS — N730 Acute parametritis and pelvic cellulitis: Secondary | ICD-10-CM

## 2011-02-19 LAB — CBC
HCT: 26.1 % — ABNORMAL LOW (ref 36.0–46.0)
MCH: 27.9 pg (ref 26.0–34.0)
MCHC: 32.2 g/dL (ref 30.0–36.0)
RDW: 13.1 % (ref 11.5–15.5)

## 2011-02-19 LAB — BASIC METABOLIC PANEL
BUN: 12 mg/dL (ref 6–23)
Chloride: 98 mEq/L (ref 96–112)
Creatinine, Ser: 0.7 mg/dL (ref 0.50–1.10)
GFR calc Af Amer: >90 mL/min (ref >90)
GFR calc non Af Amer: >90 mL/min (ref >90)
Glucose, Bld: 100 mg/dL — ABNORMAL HIGH (ref 70–99)

## 2011-02-19 MED ORDER — ZOLPIDEM TARTRATE 10 MG PO TABS
10.0000 mg | ORAL_TABLET | Freq: Every evening | ORAL | Status: DC | PRN
  Administered 2011-02-19: 10 mg via ORAL
  Filled 2011-02-19: qty 1

## 2011-02-19 NOTE — Treatment Plan (Signed)
Report called to 3rd floor staff.  Patient may come to [phone] at [phone]

## 2011-02-19 NOTE — Progress Notes (Signed)
   Subjective: Patient reports tolerating PO and no problems voiding.    Objective: I have reviewed patient's vital signs, intake and output, medications and labs.  General: alert Resp: clear to auscultation bilaterally Cardio: regular rate and rhythm, S1, S2 normal, no murmur, click, rub or gallop GI: soft, non-tender; bowel sounds normal; no masses,  no organomegaly Incisions: healing well Assessment: s/p : stable Her WBC and HBG are decreasing- presumably from hydration  Plan: continue IV Zosyn  LOS: 1 day    Walther Sanagustin C. 02/19/2011, 7:40 AM

## 2011-02-20 MED ORDER — GENTAMICIN SULFATE 40 MG/ML IJ SOLN
130.0000 mg | Freq: Three times a day (TID) | INTRAVENOUS | Status: DC
  Administered 2011-02-20 – 2011-02-25 (×14): 130 mg via INTRAVENOUS
  Filled 2011-02-20 (×15): qty 3.25

## 2011-02-20 NOTE — Progress Notes (Signed)
Subjective: Patient reports tolerating PO. She is hungry, but has quick satiety.   Objective: I have reviewed patient's vital signs, intake and output, labs and microbiology. Microbiology from last week's surgery returned E Coli Sensitive to Cephalosporins, and aminoglycosides, but Resistant to CIPRO, and Ampicillin, and IR to AMP/Sulbactam. General: alert, no distress and eating dinner. GI: normal findings: incision no erythema, but lower abdomen firm, without guarding or rebound , quite firm to just below umbilicus and abnormal findings:  mass, located in the lower abdomen   Assessment/Plan: Midline postop pelvic abscess, status post prior right ovarian abscess Resistant to cipro and Ampicillin, sensitive to aminoglycosides,and cephalosporins. Disp: Add gentamycin to Zosyn, consult interventional radiology in am [phone] or [phone] to consider percutaneous drainage. Probable procedure reviewed with patient.  LOS: 2 days    Sherill Wegener V 02/20/2011, 8:12 PM

## 2011-02-20 NOTE — Progress Notes (Signed)
Subjective: Patient reports really no complaints except bloating.  No nausea, feels pretty good overall  Objective: I have reviewed patient's vital signs, intake and output, medications, labs and radiology results.  Blood pressure 118/83, pulse 112, temperature 98.7 F (37.1 C), temperature source Oral, resp. rate 17, height 5\' 5"  (1.651 m), weight 174 lb 8 oz (79.153 kg), last menstrual period 01/25/2011, SpO2 98.00%. Tmax 102  General: alert, cooperative and no distress Resp: clear to auscultation bilaterally Cardio: regular rate and rhythm, S1, S2 normal, no murmur, click, rub or gallop GI: soft, non-tender; bowel sounds normal; no masses,  no organomegaly  Really very benign  Ct Abdomen Pelvis W Contrast  02/18/2011  *RADIOLOGY REPORT*  Clinical Data: Status post surgery for tubo-ovarian abscess removal and oophorectomy; postoperative fever.  CT ABDOMEN AND PELVIS WITH CONTRAST  Technique:  Multidetector CT imaging of the abdomen and pelvis was performed following the standard protocol during bolus administration of intravenous contrast.  Contrast: OMNIPAQUE IOHEXOL 300 MG/ML IV SOLN  Comparison: CT of the abdomen and pelvis performed 02/01/2011, pelvic ultrasound performed 02/03/2011, and renal ultrasound performed 02/07/2011  Findings: The visualized lung bases are clear.  The liver and spleen are unremarkable in appearance.  The patient is status post cholecystectomy, with clips noted at the gallbladder fossa.  The pancreas and adrenal glands are unremarkable.  A likely small 0.8 cm isodense cyst is noted arising at the interpole region of the left kidney.  The kidneys are grossly unremarkable in appearance.  There is no evidence of hydronephrosis.  No renal or ureteral stones are seen.  Minimal nonspecific perinephric stranding is appreciated.  The small bowel is unremarkable in appearance.  A tiny hiatal hernia is noted; the stomach is otherwise unremarkable.  No acute vascular  abnormalities are seen.  Scattered prominent periaortic and paracaval nodes are noted, likely reflecting inflammatory process in the pelvis.  There is a very large collection of fluid and air noted apparently at the fundus of the uterus, measuring approximately 12.0 x 9.8 x 9.4 cm.  A thick rind of peripheral enhancement is noted; the wall of the uterus is difficult to characterize, and this could be arising directly adjacent to the uterus. This is compatible with a very large abscess.  There is diffuse inflammation involving the left ovary and fallopian tube, and the left fallopian tube is filled with air.  Associated mesenteric inflammation is noted; there is also mild inflammation involving the adjacent sigmoid colon.  No definite fistula is identified.  Trace abscess is noted extending into the likely prior location of the right fallopian tube and into the right adnexal region.  A focal 3.2 cm phlegmon is noted along the ascending colon, likely reflecting the underlying pelvic process.  The location of this phlegmon is somewhat unusual, though there is no definite evidence for bowel injury.  The cecum is compressed anteriorly by the large pelvic abscess, with associated cecal wall thickening.  The appendix is not readily characterized.  A small 1.7 cm collection of fluid is noted adjacent to the splenic flexure of the colon, without definite abscess.  Mild diverticulosis is noted along the sigmoid colon.  The bladder is compressed by the large abscess, but is otherwise grossly unremarkable in appearance.  No inguinal lymphadenopathy is seen.  No acute osseous abnormalities are identified.  IMPRESSION:  1.  Very large abscess noted at the fundus of the uterus, measuring 12.0 x 9.8 x 9.4 cm and containing air and fluid. The relation of  this abscess to the uterus is difficult to determine, as the wall of the uterus is not well characterized.  This could be arising directly adjacent to the uterus. 2.  Trace abscess  extends into the likely prior location of the right fallopian tube and into the right adnexal region; diffuse inflammation involving the left ovary and left fallopian tube.  The left fallopian tube is filled with air. 3.  Focal 3.2 cm phlegmon noted along the ascending colon, likely reflecting the underlying pelvic process.  The location of this phlegmon is somewhat unusual, though there is no definite evidence for bowel injury. 4.  Small 1.7 cm collection of fluid adjacent to the splenic flexure of the colon, without definite peripherally enhancing abscess. 5.  Cecal wall thickening; the cecum is compressed anteriorly by the large pelvic abscess.  Mild inflammation noted along the sigmoid colon. 6.  Mesenteric inflammation noted; prominent periaortic and paracaval nodes likely reflect the pelvic inflammatory process. 7.  Mild diverticulosis along the sigmoid colon. 8.  Tiny hiatal hernia. 9.  Likely small 0.8 cm isodense cyst arising at the interpole region of the left kidney.  Findings were discussed with Donneta Romberg in the [facility] at 11:19 p.m. on 02/18/2011.  Original Report Authenticated By: Tonia Ghent, M.D.      Assessment/Plan: 1..  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  I talked with Dr. Emelda Fear and he will see patient later today.  We will continue Zosyn and gauge clinical response.  Pt aware of probable definitive surgical management.  LOS: 2 days    Elyjah Hazan H 02/20/2011, 7:55 AM

## 2011-02-20 NOTE — Progress Notes (Signed)
ANTIBIOTIC CONSULT NOTE - INITIAL  Pharmacy Consult for Gentamicin Indication: Recurrent Pelvic Abscess  Allergies  Allergen Reactions  . Lactose Intolerance (Gi)   . Sulfa Antibiotics Rash    Patient Measurements: Height: 5\' 5"  (165.1 cm) Weight: 174 lb 8 oz (79.153 kg) IBW/kg (Calculated) : 57  Adjusted Body Weight: 63.7kg  Vital Signs: Temp: 98.6 F (37 C) (11/12 1800) Temp src: Oral (11/12 1800) BP: 122/79 mmHg (11/12 1800) Pulse Rate: 104  (11/12 1800) Labs:  [facility] 02/19/11 0524 02/18/11 2007  WBC 15.1* 16.9*  HGB 8.4* 9.7*  PLT 491* 604*  LABCREA -- --  CREATININE 0.70 --   Estimated Creatinine Clearance: 94.3 ml/min (by C-G formula based on Cr of 0.7). Microbiology: Recent Results (from the past 720 hour(s))  WET PREP, GENITAL     Status: Abnormal   Collection Time   02/01/11  5:45 PM      Component Value Range Status Comment   Yeast, Wet Prep NONE SEEN  NONE SEEN  Final    Trich, Wet Prep NONE SEEN  NONE SEEN  Final    Clue Cells, Wet Prep NONE SEEN  NONE SEEN  Final    WBC, Wet Prep HPF POC FEW (*) NONE SEEN  Final   CULTURE, BLOOD (ROUTINE X 2)     Status: Normal   Collection Time   02/01/11  8:05 PM      Component Value Range Status Comment   Specimen Description BLOOD RIGHT ARM   Final    Special Requests BOTTLES DRAWN AEROBIC AND ANAEROBIC 5CC   Final    Setup Time [id number]   Final    Culture     Final    Value: STAPHYLOCOCCUS SPECIES (COAGULASE NEGATIVE)     Note: THE SIGNIFICANCE OF ISOLATING THIS ORGANISM FROM A SINGLE VENIPUNCTURE CANNOT BE PREDICTED WITHOUT FURTHER CLINICAL AND CULTURE CORRELATION. SUSCEPTIBILITIES AVAILABLE ONLY ON REQUEST.     Note: Gram Stain Report Called to,Read Back By and Verified With: NELSON T ON 02/02/2011 AT 2100 BY MILES A Performed at [facility]   Report Status 02/04/2011 FINAL   Final   CULTURE, BLOOD (ROUTINE X 2)     Status: Normal   Collection Time   02/01/11  8:11 PM      Component Value  Range Status Comment   Specimen Description RIGHT ANTECUBITAL   Final    Special Requests BOTTLES DRAWN AEROBIC AND ANAEROBIC 5CC   Final    Culture NO GROWTH 5 DAYS   Final    Report Status 02/06/2011 FINAL   Final   SURGICAL PCR SCREEN     Status: Normal   Collection Time   02/06/11  8:47 AM      Component Value Range Status Comment   MRSA, PCR NEGATIVE  NEGATIVE  Final    Staphylococcus aureus NEGATIVE  NEGATIVE  Final   CULTURE, ROUTINE-ABSCESS     Status: Normal   Collection Time   02/06/11  1:12 PM      Component Value Range Status Comment   Specimen Description ABSCESS RIGHT OVARY   Final    Special Requests ZOSYN 3.375gm @1138    Final    Gram Stain     Final    Value: ABUNDANT WBC PRESENT,BOTH PMN AND MONONUCLEAR     NO SQUAMOUS EPITHELIAL CELLS SEEN     FEW GRAM NEGATIVE RODS   Culture FEW ESCHERICHIA COLI   Final    Report Status 02/09/2011 FINAL   Final  Organism ID, Bacteria ESCHERICHIA COLI   Final   ANAEROBIC CULTURE     Status: Normal   Collection Time   02/06/11  1:12 PM      Component Value Range Status Comment   Specimen Description ABSCESS RIGHT OVARY   Final    Special Requests ZOSYN 3.375gm @1138    Final    Gram Stain     Final    Value: ABUNDANT WBC PRESENT,BOTH PMN AND MONONUCLEAR     NO SQUAMOUS EPITHELIAL CELLS SEEN     FEW GRAM NEGATIVE RODS   Culture NO ANAEROBES ISOLATED   Final    Report Status 02/11/2011 FINAL   Final     Medical History: Past Medical History  Diagnosis Date  . Diverticulitis   . Migraine headache     Medications:  Zosyn 3.375g IV q8h Assessment: 45yo admitted w/ fever s/p TOA removal and oopherectomy . Pt has recurrent 12cm pelvic abscess sensitive to Aminoglycosides.  Goal of Therapy:  Gentamicin peaks 6-8 mcg/ml  Troughs < 1 mcg/ml  Plan:  1. Gentamicin 130mg  IV q8h. 2. Continue to follow and check levels as clinically indicated. Thanks!  Claybon Jabs 02/20/2011,8:15 PM

## 2011-02-21 ENCOUNTER — Ambulatory Visit (HOSPITAL_COMMUNITY)
Admit: 2011-02-21 | Discharge: 2011-02-21 | Disposition: A | Discharge status: Home / Self Care | Payer: BC Managed Care – PPO | Attending: Family Medicine | Admitting: Family Medicine

## 2011-02-21 ENCOUNTER — Ambulatory Visit (HOSPITAL_COMMUNITY)
Admission: RE | Admit: 2011-02-21 | Discharge: 2011-02-21 | Disposition: A | Discharge status: Home / Self Care | Payer: BC Managed Care – PPO | Source: Ambulatory Visit | Attending: Family Medicine | Admitting: Family Medicine

## 2011-02-21 DIAGNOSIS — N730 Acute parametritis and pelvic cellulitis: Secondary | ICD-10-CM

## 2011-02-21 LAB — DIFFERENTIAL
Basophils Absolute: 0 10*3/uL (ref 0.0–0.1)
Basophils Relative: 0 % (ref 0–1)
Eosinophils Absolute: 0.2 10*3/uL (ref 0.0–0.7)
Eosinophils Relative: 2 % (ref 0–5)
Lymphs Abs: 1.4 10*3/uL (ref 0.7–4.0)
Neutrophils Relative %: 77 % (ref 43–77)

## 2011-02-21 LAB — BASIC METABOLIC PANEL
Calcium: 9.4 mg/dL (ref 8.4–10.5)
GFR calc Af Amer: >90 mL/min (ref >90)
GFR calc non Af Amer: >90 mL/min (ref >90)
Glucose, Bld: 93 mg/dL (ref 70–99)
Potassium: 3.4 mEq/L — ABNORMAL LOW (ref 3.5–5.1)
Sodium: 137 mEq/L (ref 135–145)

## 2011-02-21 LAB — CBC
MCH: 27.6 pg (ref 26.0–34.0)
MCV: 86.7 fL (ref 78.0–100.0)
Platelets: 416 10*3/uL — ABNORMAL HIGH (ref 150–400)
RBC: 2.79 MIL/uL — ABNORMAL LOW (ref 3.87–5.11)
RDW: 13.1 % (ref 11.5–15.5)

## 2011-02-21 LAB — PREPARE RBC (CROSSMATCH)

## 2011-02-21 LAB — PROTIME-INR: INR: 1.08 (ref 0.00–1.49)

## 2011-02-21 MED ORDER — FENTANYL CITRATE 0.05 MG/ML IJ SOLN
INTRAMUSCULAR | Status: AC | PRN
  Administered 2011-02-21 (×2): 100 ug via INTRAVENOUS

## 2011-02-21 MED ORDER — DIPHENHYDRAMINE HCL 25 MG PO CAPS
25.0000 mg | ORAL_CAPSULE | Freq: Once | ORAL | Status: AC
  Administered 2011-02-21: 25 mg via ORAL
  Filled 2011-02-21: qty 1

## 2011-02-21 MED ORDER — MIDAZOLAM HCL 5 MG/5ML IJ SOLN
INTRAMUSCULAR | Status: AC | PRN
  Administered 2011-02-21: 1 mg via INTRAVENOUS
  Administered 2011-02-21: 2 mg via INTRAVENOUS

## 2011-02-21 NOTE — Progress Notes (Signed)
Report called to accepting RN Room 304, [facility].  [place] here transport [facility].

## 2011-02-21 NOTE — Progress Notes (Signed)
Pt transported to [facility] for procedure via Ambulance. Hall pass info completed.

## 2011-02-21 NOTE — Progress Notes (Addendum)
Subjective: Patient reports + flatus and no problems voiding.  No nausea or vomiting.  Objective: I have reviewed patient's vital signs, medications, labs and radiology results.  General: alert, cooperative and no distress Resp: clear to auscultation bilaterally GI: soft, mild tenderness, no rebound, no guarding Extremities: Homans sign is negative, no sign of DVT Vaginal Bleeding: none   Assessment/Plan: 45 yo female with pelvic abscess Cont Abx Interventional Radiology will be called by Dr. Emelda Fear to schedule draining of abscess Pt is NPO Will speak with Emelda Fear about anemia.   LOS: 3 days    Oral Hallgren H. 02/21/2011, 6:57 AM

## 2011-02-21 NOTE — Procedures (Signed)
CT guided abscess 68F drain cath placement 5ml purulent out, sent for cx No complication

## 2011-02-21 NOTE — Progress Notes (Signed)
UR Chart review completed.  

## 2011-02-21 NOTE — ED Notes (Signed)
PT TRANSPORTED TO PREP ROOM 7 VIA [facility] FOR RECOVERY.  AWAITING [facility] FOR TRANSPORT [facility].

## 2011-02-22 LAB — BASIC METABOLIC PANEL
BUN: 8 mg/dL (ref 6–23)
Calcium: 9.2 mg/dL (ref 8.4–10.5)
GFR calc non Af Amer: >90 mL/min (ref >90)
Glucose, Bld: 88 mg/dL (ref 70–99)

## 2011-02-22 LAB — CBC
Hemoglobin: 9 g/dL — ABNORMAL LOW (ref 12.0–15.0)
MCH: 28 pg (ref 26.0–34.0)
MCHC: 32.6 g/dL (ref 30.0–36.0)
MCV: 85.7 fL (ref 78.0–100.0)
Platelets: 395 10*3/uL (ref 150–400)
RBC: 3.22 MIL/uL — ABNORMAL LOW (ref 3.87–5.11)

## 2011-02-22 LAB — DIFFERENTIAL
Eosinophils Absolute: 0.3 10*3/uL (ref 0.0–0.7)
Eosinophils Relative: 4 % (ref 0–5)
Lymphs Abs: 1.6 10*3/uL (ref 0.7–4.0)
Monocytes Relative: 9 % (ref 3–12)

## 2011-02-22 NOTE — Progress Notes (Signed)
Drain flushed at 2351 on 02/21/11 and 0600 on 02/22/11 with 5 mL of NS. Flushed well.

## 2011-02-22 NOTE — Progress Notes (Signed)
ANTIBIOTIC CONSULT NOTE - FOLLOW UP  Pharmacy Consult for Gentamicin Indication: Recurrent Pelvic Abscess  Allergies  Allergen Reactions  . Lactose Intolerance (Gi)   . Sulfa Antibiotics Rash    Patient Measurements: Height: 5\' 5"  (165.1 cm) Weight: 174 lb 8 oz (79.153 kg) IBW/kg (Calculated) : 57  Adjusted Body Weight: 63.7kg  Vital Signs: Temp: 97.8 F (36.6 C) (11/14 0529) Temp src: Oral (11/14 0529) BP: 116/76 mmHg (11/14 0529) Pulse Rate: 88  (11/14 0529)  Labs:  Basename 02/22/11 0650 02/21/11 0512  WBC 7.7 12.2*  HGB 9.0* 7.7*  PLT 395 416*  LABCREA -- --  CREATININE 0.60 0.60   Estimated Creatinine Clearance: 94.3 ml/min (by C-G formula based on Cr of 0.6).   Microbiology: Recent Results (from the past 720 hour(s))  WET PREP, GENITAL     Status: Abnormal   Collection Time   02/01/11  5:45 PM      Component Value Range Status Comment   Yeast, Wet Prep NONE SEEN  NONE SEEN  Final    Trich, Wet Prep NONE SEEN  NONE SEEN  Final    Clue Cells, Wet Prep NONE SEEN  NONE SEEN  Final    WBC, Wet Prep HPF POC FEW (*) NONE SEEN  Final   CULTURE, BLOOD (ROUTINE X 2)     Status: Normal   Collection Time   02/01/11  8:05 PM      Component Value Range Status Comment   Specimen Description BLOOD RIGHT ARM   Final    Special Requests BOTTLES DRAWN AEROBIC AND ANAEROBIC 5CC   Final    Setup Time [id number]   Final    Culture     Final    Value: STAPHYLOCOCCUS SPECIES (COAGULASE NEGATIVE)     Note: THE SIGNIFICANCE OF ISOLATING THIS ORGANISM FROM [name] SINGLE VENIPUNCTURE CANNOT BE PREDICTED WITHOUT FURTHER CLINICAL AND CULTURE CORRELATION. SUSCEPTIBILITIES AVAILABLE ONLY ON REQUEST.     Note: Gram Stain Report Called to,Read Back By and Verified With: Sheila [name] ON 02/02/2011 AT 2100 BY Sheila [name] Performed at [facility]   Report Status 02/04/2011 FINAL   Final   CULTURE, BLOOD (ROUTINE X 2)     Status: Normal   Collection Time   02/01/11  8:11 PM      Component  Value Range Status Comment   Specimen Description RIGHT ANTECUBITAL   Final    Special Requests BOTTLES DRAWN AEROBIC AND ANAEROBIC 5CC   Final    Culture NO GROWTH 5 DAYS   Final    Report Status 02/06/2011 FINAL   Final   SURGICAL PCR SCREEN     Status: Normal   Collection Time   02/06/11  8:47 AM      Component Value Range Status Comment   MRSA, PCR NEGATIVE  NEGATIVE  Final    Staphylococcus aureus NEGATIVE  NEGATIVE  Final   CULTURE, ROUTINE-ABSCESS     Status: Normal   Collection Time   02/06/11  1:12 PM      Component Value Range Status Comment   Specimen Description ABSCESS RIGHT OVARY   Final    Special Requests ZOSYN 3.375gm @1138    Final    Gram Stain     Final    Value: ABUNDANT WBC PRESENT,BOTH PMN AND MONONUCLEAR     NO SQUAMOUS EPITHELIAL CELLS SEEN     FEW GRAM NEGATIVE RODS   Culture FEW ESCHERICHIA COLI   Final    Report Status 02/09/2011  FINAL   Final    Organism ID, Bacteria ESCHERICHIA COLI   Final   ANAEROBIC CULTURE     Status: Normal   Collection Time   02/06/11  1:12 PM      Component Value Range Status Comment   Specimen Description ABSCESS RIGHT OVARY   Final    Special Requests ZOSYN 3.375gm @1138    Final    Gram Stain     Final    Value: ABUNDANT WBC PRESENT,BOTH PMN AND MONONUCLEAR     NO SQUAMOUS EPITHELIAL CELLS SEEN     FEW GRAM NEGATIVE RODS   Culture NO ANAEROBES ISOLATED   Final    Report Status 02/11/2011 FINAL   Final   CULTURE, ROUTINE-ABSCESS     Status: Normal (Preliminary result)   Collection Time   02/21/11  2:16 PM      Component Value Range Status Comment   Specimen Description ABSCESS PELVIS   Final    Special Requests NONE   Final    Gram Stain     Final    Value: ABUNDANT WBC PRESENT, PREDOMINANTLY PMN     NO SQUAMOUS EPITHELIAL CELLS SEEN     NO ORGANISMS SEEN   Culture NO GROWTH   Final    Report Status PENDING   Incomplete     Anti-infectives     Start     Dose/Rate Route Frequency Ordered Stop   02/20/11 2100    gentamicin (GARAMYCIN) 130 mg in dextrose 5 % 50 mL IVPB        130 mg 106.5 mL/hr over 30 Minutes Intravenous Every 8 hours 02/20/11 2014     02/18/11 2345  piperacillin-tazobactam (ZOSYN) IVPB 3.375 g       3.375 g 12.5 mL/hr over 240 Minutes Intravenous 3 times per day 02/18/11 2335            Assessment: Pt is day 2 of gentamicin.  Renal function is stable.  WBC's reduced to 7,700. Pt is afebrile.  Goal of Therapy:  Gent peak 6-8 mcg/ml; gent trough <1 mcg/ml  Plan:  Continue current gentamicin dose.  F/U treatment plan on 11/15.  If gentamicin continues after tomorrow, will check levels.  Sheila [name] 02/22/2011,9:41 AM

## 2011-02-22 NOTE — Progress Notes (Signed)
Subjective: Patient reports incisional pain, tolerating PO, + BM and no problems voiding.    Objective: I have reviewed patient's vital signs, medications, labs and radiology results.  General: alert, cooperative and appears stated age GI: soft, non-tender; bowel sounds normal; no masses,  no organomegaly Extremities: extremities normal, atraumatic, no cyanosis or edema   Assessment/Plan: Pelvic abscess s/p IR drainage--feels better.  Serosanguinous drainage noted.  Cx Pending. S/p 2 u PRBC's secondary to anemia. CBC pending.  LOS: 4 days    Annel Zunker S 02/22/2011, 7:38 AM

## 2011-02-22 NOTE — Progress Notes (Signed)
Subjective:     Patient reports tolerating PO and + flatus.   Dr Tawni Levy evaluation and notes appreciated Objective: I have reviewed patient's vital signs, intake and output, labs and microbiology.  Pt's gram stains : numerous PMN's, no identified organisms. Culture : too early to eval Hemoglobin & Hematocrit     Component Value Date/Time   HGB 9.0* 02/22/2011 0650   HCT 27.6* 02/22/2011 0650  Wbc normalized to 7.7   GI: normal findings: abd soft, distinctly less full in lower abdomen than yesterday., no guarding or rebound and abnormal findings:  firmness extends halfway to umbilicus, partially due to prior op site. Legs; nontender   Assessment/Plan: Pelvic abscess, s/p IR drainage. Improving. Anemia, persists, improved at hgb 9.7 s.p transfusion 2 u prbc. Abd u/s in am to asses diameter of residual abscess.  LOS: 4 days    Sheila [name] 02/22/2011, 8:09 AM

## 2011-02-23 ENCOUNTER — Inpatient Hospital Stay (HOSPITAL_COMMUNITY): Payer: BC Managed Care – PPO

## 2011-02-23 LAB — TYPE AND SCREEN
ABO/RH(D): O POS
Unit division: 0

## 2011-02-23 LAB — GENTAMICIN LEVEL, TROUGH: Gentamicin Trough: 1.2 ug/mL (ref 0.5–2.0)

## 2011-02-23 LAB — CBC
MCH: 27.5 pg (ref 26.0–34.0)
MCHC: 32.1 g/dL (ref 30.0–36.0)
Platelets: 469 10*3/uL — ABNORMAL HIGH (ref 150–400)
RDW: 13.1 % (ref 11.5–15.5)

## 2011-02-23 LAB — BASIC METABOLIC PANEL
Calcium: 9.6 mg/dL (ref 8.4–10.5)
GFR calc non Af Amer: >90 mL/min (ref >90)
Sodium: 138 mEq/L (ref 135–145)

## 2011-02-23 LAB — GENTAMICIN LEVEL, PEAK: Gentamicin Pk: 5.6 ug/mL (ref 5.0–10.0)

## 2011-02-23 MED ORDER — IOHEXOL 300 MG/ML  SOLN
100.0000 mL | Freq: Once | INTRAMUSCULAR | Status: AC | PRN
  Administered 2011-02-23: 100 mL via INTRAVENOUS

## 2011-02-23 NOTE — Progress Notes (Signed)
ANTIBIOTIC CONSULT NOTE - FOLLOW UP  Pharmacy Consult for Gentamicin Indication: Recurrent Pelvic Abscess  Allergies  Allergen Reactions  . Lactose Intolerance (Gi)   . Sulfa Antibiotics Rash    Patient Measurements: Height: 5\' 5"  (165.1 cm) Weight: 174 lb 8 oz (79.153 kg) IBW/kg (Calculated) : 57  Adjusted Body Weight: 63.7kg  Vital Signs: Temp: 98.3 F (36.8 C) (11/15 1800) Temp src: Oral (11/15 1800) BP: 118/79 mmHg (11/15 1800) Pulse Rate: 83  (11/15 1800) Intake/Output from previous day: 11/14 0701 - 11/15 0700 In: 2174.6 [P.O.:550; I.V.:1524.6; IV Piggyback:100] Out: 1350 [Urine:1300; Drains:50] Intake/Output from this shift:    Labs:  Basename 02/23/11 0503 02/22/11 0650 02/21/11 0512  WBC 6.0 7.7 12.2*  HGB 9.9* 9.0* 7.7*  PLT 469* 395 416*  LABCREA -- -- --  CREATININE 0.66 0.60 0.60   Estimated Creatinine Clearance: 94.3 ml/min (by C-G formula based on Cr of 0.66).  Basename 02/23/11 1809 02/23/11 1530  VANCOTROUGH -- --  Leodis Binet -- --  Drue Dun -- --  GENTTROUGH -- 1.2  GENTPEAK 5.6 --  GENTRANDOM -- --  TOBRATROUGH -- --  TOBRAPEAK -- --  TOBRARND -- --  AMIKACINPEAK -- --  AMIKACINTROU -- --  AMIKACIN -- --     Microbiology: Recent Results (from the past 720 hour(s))  WET PREP, GENITAL     Status: Abnormal   Collection Time   02/01/11  5:45 PM      Component Value Range Status Comment   Yeast, Wet Prep NONE SEEN  NONE SEEN  Final    Trich, Wet Prep NONE SEEN  NONE SEEN  Final    Clue Cells, Wet Prep NONE SEEN  NONE SEEN  Final    WBC, Wet Prep HPF POC FEW (*) NONE SEEN  Final   CULTURE, BLOOD (ROUTINE X 2)     Status: Normal   Collection Time   02/01/11  8:05 PM      Component Value Range Status Comment   Specimen Description BLOOD RIGHT ARM   Final    Special Requests BOTTLES DRAWN AEROBIC AND ANAEROBIC 5CC   Final    Setup Time [id number]   Final    Culture     Final    Value: STAPHYLOCOCCUS SPECIES (COAGULASE NEGATIVE)       Note: THE SIGNIFICANCE OF ISOLATING THIS ORGANISM FROM A SINGLE VENIPUNCTURE CANNOT BE PREDICTED WITHOUT FURTHER CLINICAL AND CULTURE CORRELATION. SUSCEPTIBILITIES AVAILABLE ONLY ON REQUEST.     Note: Gram Stain Report Called to,Read Back By and Verified With: NELSON T ON 02/02/2011 AT 2100 BY MILES A Performed at [facility]   Report Status 02/04/2011 FINAL   Final   CULTURE, BLOOD (ROUTINE X 2)     Status: Normal   Collection Time   02/01/11  8:11 PM      Component Value Range Status Comment   Specimen Description RIGHT ANTECUBITAL   Final    Special Requests BOTTLES DRAWN AEROBIC AND ANAEROBIC 5CC   Final    Culture NO GROWTH 5 DAYS   Final    Report Status 02/06/2011 FINAL   Final   SURGICAL PCR SCREEN     Status: Normal   Collection Time   02/06/11  8:47 AM      Component Value Range Status Comment   MRSA, PCR NEGATIVE  NEGATIVE  Final    Staphylococcus aureus NEGATIVE  NEGATIVE  Final   CULTURE, ROUTINE-ABSCESS     Status: Normal   Collection Time  02/06/11  1:12 PM      Component Value Range Status Comment   Specimen Description ABSCESS RIGHT OVARY   Final    Special Requests ZOSYN 3.375gm @1138    Final    Gram Stain     Final    Value: ABUNDANT WBC PRESENT,BOTH PMN AND MONONUCLEAR     NO SQUAMOUS EPITHELIAL CELLS SEEN     FEW GRAM NEGATIVE RODS   Culture FEW ESCHERICHIA COLI   Final    Report Status 02/09/2011 FINAL   Final    Organism ID, Bacteria ESCHERICHIA COLI   Final   ANAEROBIC CULTURE     Status: Normal   Collection Time   02/06/11  1:12 PM      Component Value Range Status Comment   Specimen Description ABSCESS RIGHT OVARY   Final    Special Requests ZOSYN 3.375gm @1138    Final    Gram Stain     Final    Value: ABUNDANT WBC PRESENT,BOTH PMN AND MONONUCLEAR     NO SQUAMOUS EPITHELIAL CELLS SEEN     FEW GRAM NEGATIVE RODS   Culture NO ANAEROBES ISOLATED   Final    Report Status 02/11/2011 FINAL   Final   ANAEROBIC CULTURE     Status: Normal  (Preliminary result)   Collection Time   02/21/11  2:15 PM      Component Value Range Status Comment   Specimen Description ABSCESS PELVIC FLUID   Final    Special Requests NONE   Final    Gram Stain PENDING   Incomplete    Culture     Final    Value: NO ANAEROBES ISOLATED; CULTURE IN PROGRESS FOR 5 DAYS   Report Status PENDING   Incomplete   FUNGUS CULTURE W SMEAR     Status: Normal (Preliminary result)   Collection Time   02/21/11  2:15 PM      Component Value Range Status Comment   Specimen Description ABSCESS PELVIC FLUID   Final    Special Requests NONE   Final    Fungal Smear NO YEAST OR FUNGAL ELEMENTS SEEN   Final    Culture CULTURE IN PROGRESS FOR FOUR WEEKS   Final    Report Status PENDING   Incomplete   CULTURE, ROUTINE-ABSCESS     Status: Normal (Preliminary result)   Collection Time   02/21/11  2:16 PM      Component Value Range Status Comment   Specimen Description ABSCESS PELVIS   Final    Special Requests NONE   Final    Gram Stain     Final    Value: ABUNDANT WBC PRESENT, PREDOMINANTLY PMN     NO SQUAMOUS EPITHELIAL CELLS SEEN     NO ORGANISMS SEEN   Culture FEW GRAM NEGATIVE RODS   Final    Report Status PENDING   Incomplete     Anti-infectives     Start     Dose/Rate Route Frequency Ordered Stop   02/20/11 2100   gentamicin (GARAMYCIN) 130 mg in dextrose 5 % 50 mL IVPB        130 mg 106.5 mL/hr over 30 Minutes Intravenous Every 8 hours 02/20/11 2014     02/18/11 2345  piperacillin-tazobactam (ZOSYN) IVPB 3.375 g       3.375 g 12.5 mL/hr over 240 Minutes Intravenous 3 times per day 02/18/11 2335            Assessment: Gentamicin levels drawn around 1600 dose today.  Trough at 1530=1.2 with calculated trough at end of interval=1.0. Peak at 1809=5.6 with calculated peak at the end of infusion= 8.18mcg/ml. Based on these parameters and t 1/2 and ke, will continue current regimen.  Goal of Therapy:  Gentamicin peak 6-14mcg/ml and troughs < 1  mcg/ml.  Plan:  1. Continue current Gentamicin regimen of 130mg  IV q8h. 2. Will continue to follow and adjust accordingly.  Thanks!  Claybon Jabs 02/23/2011,7:41 PM

## 2011-02-23 NOTE — Progress Notes (Signed)
Pt has returned from U/S . In NAD . Resting quietly in bed.

## 2011-02-23 NOTE — Progress Notes (Signed)
Called by U/S, pt IV leaking. Went to U/S, removed IV  from Left hand and switch IV fluids to saline lock in right hand. Pt tolerated well. In no acute distress.  Fluids currently infusing at 125 ml/hr (lactate ringers).

## 2011-02-23 NOTE — Progress Notes (Signed)
Subjective:   Pt now on hosp day 5 s/p readmission for anterior pelvic recurrent abscess,  , status post Rt S&O for intraovarian abscess.   Patient reports tolerating PO, + flatus, + BM and no problems voiding.    Objective: I have reviewed patient's vital signs, intake and output, medications and radiology results. Ultrasound shows collapsed abscess pocket, but margins poorly delineated, and in order to better evaluate bowel, will order repeat of CT abd and pelvis  General: alert and no distress GI: nontender lower abdomen. percutaneous drain in llq, scanty drainage overnite.no guarding or rebound.  CBC    Component Value Date/Time   WBC 6.0 02/23/2011 0503   RBC 3.60* 02/23/2011 0503   HGB 9.9* 02/23/2011 0503   HCT 30.8* 02/23/2011 0503   PLT 469* 02/23/2011 0503   MCV 85.6 02/23/2011 0503   MCH 27.5 02/23/2011 0503   MCHC 32.1 02/23/2011 0503   RDW 13.1 02/23/2011 0503   LYMPHSABS 1.6 02/22/2011 0650   MONOABS 0.7 02/22/2011 0650   EOSABS 0.3 02/22/2011 0650   BASOSABS 0.0 02/22/2011 0650  Culture NO GROWTH from IR sample, prior surgery's culture E Coli S to cephalosporins and Aminoglycosides, Resistant to Cipro, ampicillin  Assessment/Plan: Postop pelvic abscess, s/p IR drainage, improving Need to eval further to insure no bowel process ongoing.  Plan: CT abd pelvis with contrast this am.  LOS: 5 days    Sheila [name] 02/23/2011, 9:41 AM

## 2011-02-24 LAB — BASIC METABOLIC PANEL
BUN: 5 mg/dL — ABNORMAL LOW (ref 6–23)
GFR calc Af Amer: >90 mL/min (ref >90)
GFR calc non Af Amer: >90 mL/min (ref >90)
Potassium: 3.4 mEq/L — ABNORMAL LOW (ref 3.5–5.1)

## 2011-02-24 LAB — CULTURE, ROUTINE-ABSCESS

## 2011-02-24 LAB — CBC
HCT: 30.2 % — ABNORMAL LOW (ref 36.0–46.0)
MCHC: 32.5 g/dL (ref 30.0–36.0)
RDW: 13.1 % (ref 11.5–15.5)

## 2011-02-24 NOTE — Progress Notes (Signed)
Subjective: Patient reports tolerating PO and + flatus.   She is slightly sore after TV u/s yesterday, otherwise no pain Objective: I have reviewed patient's vital signs, medications, labs and microbiology.Culture : no growth at 72 hr  CT marked improvement now 9 x 3 cm Afeb x 96 hr  CBC    Component Value Date/Time   WBC 6.0 02/24/2011 0520   RBC 3.55* 02/24/2011 0520   HGB 9.8* 02/24/2011 0520   HCT 30.2* 02/24/2011 0520   PLT 475* 02/24/2011 0520   MCV 85.1 02/24/2011 0520   MCH 27.6 02/24/2011 0520   MCHC 32.5 02/24/2011 0520   RDW 13.1 02/24/2011 0520   LYMPHSABS 1.6 02/22/2011 0650   MONOABS 0.7 02/22/2011 0650   EOSABS 0.3 02/22/2011 0650   BASOSABS 0.0 02/22/2011 0650     Physical Examination: General appearance - alert, well appearing, and in no distress and anxious re home care Abdomen - tenderness noted very slight in area of drain bowel sounds normal scars from previous incisions no drainage or erythema    Assessment/Plan: Arrange home health to see pt in am. D/c tomorrow.   LOS: 6 days    Bastion Bolger V 02/24/2011, 8:13 AM

## 2011-02-25 LAB — CBC
HCT: 31.2 % — ABNORMAL LOW (ref 36.0–46.0)
MCHC: 32.4 g/dL (ref 30.0–36.0)
MCV: 85.2 fL (ref 78.0–100.0)
Platelets: 450 10*3/uL — ABNORMAL HIGH (ref 150–400)
RDW: 13.3 % (ref 11.5–15.5)
WBC: 6.8 10*3/uL (ref 4.0–10.5)

## 2011-02-25 LAB — BASIC METABOLIC PANEL
BUN: 6 mg/dL (ref 6–23)
Creatinine, Ser: 0.67 mg/dL (ref 0.50–1.10)
GFR calc Af Amer: >90 mL/min (ref >90)
GFR calc non Af Amer: >90 mL/min (ref >90)
Potassium: 3.3 mEq/L — ABNORMAL LOW (ref 3.5–5.1)

## 2011-02-25 MED ORDER — IBUPROFEN 600 MG PO TABS: 600.0000 mg | ORAL_TABLET | Freq: Four times a day (QID) | ORAL | Status: AC | PRN

## 2011-02-25 MED ORDER — CLINDAMYCIN HCL 300 MG PO CAPS: 300.0000 mg | ORAL_CAPSULE | Freq: Three times a day (TID) | ORAL | Status: AC

## 2011-02-25 MED ORDER — CEPHALEXIN 500 MG PO CAPS: 500.0000 mg | ORAL_CAPSULE | Freq: Three times a day (TID) | ORAL | Status: AC

## 2011-02-25 MED ORDER — CLINDAMYCIN HCL 150 MG PO CAPS: 150.0000 mg | ORAL_CAPSULE | Freq: Three times a day (TID) | ORAL | Status: AC

## 2011-02-25 NOTE — Discharge Planning (Signed)
Physician Discharge Summary  Patient ID: Sheila [name] MRN: [id number] DOB/AGE: 43-Jan-1967 43 y.o.  Admit date: 02/18/2011 Discharge date: 02/25/2011  Admission Diagnoses: Pelvic abscess   Discharge Diagnoses: pelvic abscess Active Problems:  * No active hospital problems. *    Discharged Condition: good  Hospital Course: Administered IV antibiotics, Zosyn, then Gentamycin added. Consult to Interventional radiology who placed a drain in abscess, with good evacuation.After 96 hours of improvement, sent home on P.O Clindamycin 450 tid and p.o. Keflex 500 tid  Consults: interventional Radiology  Significant Diagnostic Studies: CT abd on admit and 2 days post procedure  Treatments: antibiotics: Zosyn and gentamycin and procedures: IR placed drain into abscess  Discharge Exam: Blood pressure 109/65, pulse 69, temperature 98.2 F (36.8 C), temperature source Oral, resp. rate 18, height 5\' 5"  (1.651 m), weight 79.153 kg (174 lb 8 oz), last menstrual period 01/25/2011, SpO2 97.00%. General appearance: alert and no distress GI: soft, non-tender; bowel sounds normal; no masses,  no organomegaly and slight soreness above place where drain is located Incision/Wound:intact  Disposition: Home or Self Care  Discharge Orders    Future Orders Please Complete By Expires   Diet - low sodium heart healthy      Increase activity slowly      Scheduling Instructions:   May travel as passenger, may perform light activities around home, continue to "MILK" the drain to encourage emptying of the abscess site   Discharge instructions      Comments:   General Gynecological Post-Operative Instructions You may expect to feel dizzy, weak, and drowsy for as long as 24 hours after receiving the medicine that made you sleep (anesthetic). The following information pertains to your recovery period for the first 24 hours following surgery.  Do not drive a car, ride a bicycle, participate in physical  activities, or take public transportation until you are done taking narcotic pain medicines or as directed by your caregiver.  Do not drink alcohol or take tranquilizers.  Do not take medicine that has not been prescribed by your caregiver.  Do not sign important papers or make important decisions while on narcotic pain medicines.  Have a responsible person with you.  CARE OF INCISION : you may place NEOSPORIN AROUND THE DRAIN ENTRANCE SITE Keep incision clean and dry. Take showers instead of baths until your caregiver gives you permission to take baths. Check with your caregiver if you have tubes coming from the wound site.  Avoid heavy lifting (more than 10 pounds/4.5 kilograms), pushing, or pulling.  Avoid activities that may risk injury to your surgical site.  Only take over-the-counter or prescription medicines for pain, discomfort, or fever as directed by your caregiver. Do not take aspirin. It can make you bleed. Take medicines (antibiotics) that kill germs as directed.  Call the office or go to the MAU if:  You feel sick to your stomach (nauseous).  You start to throw up (vomit).  You have trouble eating or drinking.  You have an oral temperature above 100.4.  You have constipation that is not helped by adjusting diet or increasing fluid intake. Pain medicines are a common cause of constipation.  SEEK IMMEDIATE MEDICAL CARE IF:  You have persistent dizziness.  You have difficulty breathing or a congested sounding (croupy) cough.  You have an oral temperature above 100.6 not controlled by medicine.  There is increasing pain or tenderness near or in the surgical site.  ExitCare Patient Information 2011 [place], [place].   Lifting restrictions  Comments:   15 pound limit   Call MD for:  temperature >100.4      Call MD for:  persistant nausea and vomiting      Call MD for:  severe uncontrolled pain        Current Discharge Medication List    START taking these medications    Details  cephALEXin (KEFLEX) 500 MG capsule Take 1 capsule (500 mg total) by mouth 3 (three) times daily. Qty: 42 capsule, Refills: 0    !! clindamycin (CLEOCIN) 150 MG capsule Take 1 capsule (150 mg total) by mouth 3 (three) times daily. Qty: 42 capsule, Refills: 0    !! clindamycin (CLEOCIN) 300 MG capsule Take 1 capsule (300 mg total) by mouth 3 (three) times daily. Qty: 42 capsule, Refills: 0    !! ibuprofen (ADVIL,MOTRIN) 600 MG tablet Take 1 tablet (600 mg total) by mouth every 6 (six) hours as needed for pain. Qty: 30 tablet, Refills: 1     !! - Potential duplicate medications found. Please discuss with provider.    CONTINUE these medications which have NOT CHANGED   Details  !! ibuprofen (ADVIL,MOTRIN) 800 MG tablet Take 800 mg by mouth every 6 (six) hours as needed. For pain      !! - Potential duplicate medications found. Please discuss with provider.    STOP taking these medications     ciprofloxacin (CIPRO) 500 MG tablet      docusate sodium 100 MG CAPS      doxycycline (VIBRA-TABS) 100 MG tablet      HYDROcodone-acetaminophen (VICODIN) 5-500 MG per tablet        followup 2  Days [facility] at 9 am  Signed: Tilda Burrow 02/25/2011, 8:32 AM

## 2011-02-25 NOTE — Progress Notes (Signed)
D/c instructions reviewed with pt.  Pt. States understanding of same.  No home equipment needed.  Reviewed how to milk drain and empty drainage bag, d/c'd home with family, ambulated to car with staff without incident.

## 2011-02-26 LAB — ANAEROBIC CULTURE

## 2011-02-28 ENCOUNTER — Telehealth: Payer: Self-pay | Admitting: Internal Medicine

## 2011-02-28 NOTE — Telephone Encounter (Signed)
Patient is asking for a Rx for a probiotic be sent to her pharmacy [facility] in [place] Please advise??

## 2011-02-28 NOTE — Telephone Encounter (Signed)
Routed to refill box 

## 2011-02-28 NOTE — Telephone Encounter (Signed)
Please let patient know that probiotics are over-the-counter-no need for prescription She may come by and pick up samples of Align or Restora if we have

## 2011-03-01 MED ORDER — ALIGN 4 MG PO CAPS: 1.0000 | ORAL_CAPSULE | Freq: Every day | ORAL | Status: DC

## 2011-03-01 NOTE — Telephone Encounter (Signed)
Needs Rx for insurance to pay for it. thanks

## 2011-03-01 NOTE — Telephone Encounter (Signed)
Called pt and told her to check with her drug store.

## 2011-03-01 NOTE — Telephone Encounter (Signed)
Addended by: Joselyn Arrow on: 03/01/2011 10:14 AM   Modules accepted: Orders

## 2011-03-05 NOTE — Discharge Summary (Signed)
Obstetric Discharge Summary Reason for Admission: onset of labor Prenatal Procedures: ultrasound Intrapartum Procedures: spontaneous vaginal delivery Postpartum Procedures: none Complications-Operative and Postpartum: none Hemoglobin  Date Value Range Status  02/25/2011 10.1* 12.0-15.0 (g/dL) Final     HCT  Date Value Range Status  02/25/2011 31.2* 36.0-46.0 (%) Final    Discharge Diagnoses: Term Pregnancy-delivered  Discharge Information: Date: 03/05/2011 Activity: pelvic rest Diet: routine Medications: PNV, Ibuprofen and Percocet Condition: stable and improved Instructions: refer to practice specific booklet Discharge to: home   Newborn Data: This patient has no babies on file. Home with mother.  Sheila [name] 03/05/2011, 8:34 AM

## 2011-03-06 NOTE — Discharge Summary (Signed)
Attestation of Attending Supervision of Advanced Practitioner: Evaluation and management procedures were performed by the PA/NP/CNM/OB Fellow under my supervision/collaboration. Chart reviewed, and agree with management and plan.  Jaynie Collins, M.D. 03/06/2011 9:30 AM

## 2011-03-20 LAB — FUNGUS CULTURE W SMEAR: Fungal Smear: NONE SEEN

## 2011-05-15 ENCOUNTER — Encounter: Payer: Self-pay | Admitting: Gastroenterology

## 2011-05-15 ENCOUNTER — Ambulatory Visit (INDEPENDENT_AMBULATORY_CARE_PROVIDER_SITE_OTHER): Payer: BC Managed Care – PPO | Admitting: Gastroenterology

## 2011-05-15 DIAGNOSIS — R1310 Dysphagia, unspecified: Secondary | ICD-10-CM

## 2011-05-15 DIAGNOSIS — R1314 Dysphagia, pharyngoesophageal phase: Secondary | ICD-10-CM

## 2011-05-15 DIAGNOSIS — R1319 Other dysphagia: Secondary | ICD-10-CM | POA: Insufficient documentation

## 2011-05-15 DIAGNOSIS — K5730 Diverticulosis of large intestine without perforation or abscess without bleeding: Secondary | ICD-10-CM | POA: Insufficient documentation

## 2011-05-15 NOTE — Assessment & Plan Note (Signed)
Continue high-fiber diet 

## 2011-05-15 NOTE — Patient Instructions (Signed)
We have scheduled you for an upper endoscopy. Please see separate instructions.  

## 2011-05-15 NOTE — Progress Notes (Signed)
Faxed to PCP

## 2011-05-15 NOTE — Assessment & Plan Note (Signed)
Esophageal dysphagia to solid food. Suspect esophageal stricture or ring. Cannot rule out eosinophilic esophagitis. EGD/ED in near future.  I have discussed the risks, alternatives, benefits with regards to but not limited to the risk of reaction to medication, bleeding, infection, perforation and the patient is agreeable to proceed. Written consent to be obtained.   Just recently started prilosec otc. Continue for now.

## 2011-05-15 NOTE — Progress Notes (Signed)
Primary Care Physician:  Rudi Heap, MD, MD  Primary Gastroenterologist:  Roetta Sessions, MD   Chief Complaint  Patient presents with  . Dysphagia    HPI:  Sheila [name] is a 46 y.o. female here for further evaluation of esophageal dysphagia. We last saw her last year at time of colonoscopy. She has h/o pelvic abscess involving right fallopian tube/ovary which presumably developed after complicated diverticulitis. She ultimately had right salpingoophorectomy. One week later she had worsening pelvic abscess and had percutaneous pigtail catheter drainage.   She states she has fully recovered. She complains of chronic esophageal dysphagia. Difficulty swallowing meat and rice. Sometimes she has to bring the food back up. Happening more frequently. Tries to eat slow. Occasional belching. No heartburn. Bloating before she started food enzymes. Takes fiber twice a day. No melena, brbpr. Sister and father have swallowing issues as well but have not been evaluated.   Current Outpatient Prescriptions  Medication Sig Dispense Refill  . FIBER PO Take by mouth 2 (two) times daily.      [name] ibuprofen (ADVIL,MOTRIN) 800 MG tablet Take 800 mg by mouth every 6 (six) hours as needed. For pain       . omeprazole (PRILOSEC OTC) 20 MG tablet Take 20 mg by mouth daily.      . Probiotic Product (ALIGN) 4 MG CAPS Take 1 capsule by mouth daily.  31 capsule  11  . SUMAtriptan (IMITREX) 100 MG tablet Take 1 tablet by mouth as needed.        Allergies as of 05/15/2011 - Review Complete 05/15/2011  Allergen Reaction Noted  . Lactose intolerance (gi)  02/18/2011  . Sulfa antibiotics Rash 10/25/2010    Past Medical History  Diagnosis Date  . Diverticulitis   . Migraine headache   . Pelvic abscess in female     see PSH    Past Surgical History  Procedure Date  . Colonoscopy 12/05/2010    left sided diverticulosis, melanosis coli. Next TCS at age 49. Procedure: COLONOSCOPY;  Surgeon: Corbin Ade, MD;   Location: [facility];  Service: Endoscopy;  Laterality: N/A;  8:15AM  . Tonsillectomy     as child  . Cholecystectomy        . Dilation and curettage of uterus 02/06/2011    Procedure: DILATATION AND CURETTAGE (D&C);  Surgeon: Tilda Burrow, MD;  Location: [facility];  Service: Gynecology;  Laterality: N/A;  . Salpingoophorectomy 02/06/2011    Procedure: SALPINGO OOPHERECTOMY;  Surgeon: Tilda Burrow, MD;  Location: [facility];  Service: Gynecology;  Laterality: N/A;  procedure started at 1241  . Laparoscopy 02/06/2011    Procedure: LAPAROSCOPY DIAGNOSTIC;  Surgeon: Tilda Burrow, MD;  Location: [facility];  Service: Gynecology;  Laterality: N/A;    Family History  Problem Relation Age of Onset  . Colon cancer Neg Hx   . Hypertension Father   . Hyperlipidemia Father     History   Social History  . Marital Status: Divorced    Spouse Name: N/A    Number of Children: 2  . Years of Education: N/A   Occupational History  . full-time     insurance    Social History Main Topics  . Smoking status: Never Smoker   . Smokeless tobacco: Never Used  . Alcohol Use: No  . Drug Use: No  . Sexually Active: No   Other Topics Concern  . Not on file   Social History Narrative  . No narrative  on file      ROS:  General: Negative for anorexia, weight loss, fever, chills, fatigue, weakness. Eyes: Negative for vision changes.  ENT: Negative for hoarseness,  nasal congestion. CV: Negative for chest pain, angina, palpitations, dyspnea on exertion, peripheral edema.  Respiratory: Negative for dyspnea at rest, dyspnea on exertion, cough, sputum, wheezing.  GI: See history of present illness. GU:  Negative for dysuria, hematuria, urinary incontinence, urinary frequency, nocturnal urination.  MS: Negative for joint pain, low back pain.  Derm: Negative for rash or itching.  Neuro: Negative for weakness, abnormal sensation, seizure, frequent headaches, memory loss, confusion.  Psych: Negative  for anxiety, depression, suicidal ideation, hallucinations.  Endo: Negative for unusual weight change.  Heme: Negative for bruising or bleeding. Allergy: Negative for rash or hives.    Physical Examination:  BP 130/82  Pulse 75  Temp(Src) 97.3 F (36.3 C) (Temporal)  Ht 5\' 4"  (1.626 m)  Wt 172 lb (78.019 kg)  BMI 29.52 kg/m2  LMP 05/15/2011   General: Well-nourished, well-developed in no acute distress.  Head: Normocephalic, atraumatic.   Eyes: Conjunctiva pink, no icterus. Mouth: Oropharyngeal mucosa moist and pink , no lesions erythema or exudate. Neck: Supple without thyromegaly, masses, or lymphadenopathy.  Lungs: Clear to auscultation bilaterally.  Heart: Regular rate and rhythm, no murmurs rubs or gallops.  Abdomen: Bowel sounds are normal, nontender, nondistended, no hepatosplenomegaly or masses, no abdominal bruits or    hernia , no rebound or guarding.   Rectal: Not performed. Extremities: No lower extremity edema. No clubbing or deformities.  Neuro: Alert and oriented x 4 , grossly normal neurologically.  Skin: Warm and dry, no rash or jaundice.   Psych: Alert and cooperative, normal mood and affect.

## 2011-05-16 NOTE — Progress Notes (Signed)
REVIEWED.  

## 2011-05-30 MED ORDER — SODIUM CHLORIDE 0.45 % IV SOLN
Freq: Once | INTRAVENOUS | Status: AC
  Administered 2011-05-31: 09:00:00 via INTRAVENOUS

## 2011-05-31 ENCOUNTER — Ambulatory Visit (HOSPITAL_COMMUNITY)
Admission: RE | Admit: 2011-05-31 | Discharge: 2011-05-31 | Disposition: A | Discharge status: Home / Self Care | Payer: BC Managed Care – PPO | Source: Ambulatory Visit | Attending: Internal Medicine | Admitting: Internal Medicine

## 2011-05-31 ENCOUNTER — Other Ambulatory Visit: Payer: Self-pay | Admitting: Internal Medicine

## 2011-05-31 ENCOUNTER — Encounter (HOSPITAL_COMMUNITY)
Admission: RE | Disposition: A | Discharge status: Home / Self Care | Payer: Self-pay | Source: Ambulatory Visit | Attending: Internal Medicine

## 2011-05-31 ENCOUNTER — Encounter (HOSPITAL_COMMUNITY): Payer: Self-pay | Admitting: *Deleted

## 2011-05-31 DIAGNOSIS — K222 Esophageal obstruction: Secondary | ICD-10-CM

## 2011-05-31 DIAGNOSIS — K2289 Other specified disease of esophagus: Secondary | ICD-10-CM

## 2011-05-31 DIAGNOSIS — R1310 Dysphagia, unspecified: Secondary | ICD-10-CM

## 2011-05-31 DIAGNOSIS — R1319 Other dysphagia: Secondary | ICD-10-CM

## 2011-05-31 DIAGNOSIS — K228 Other specified diseases of esophagus: Secondary | ICD-10-CM

## 2011-05-31 DIAGNOSIS — K208 Other esophagitis without bleeding: Secondary | ICD-10-CM | POA: Insufficient documentation

## 2011-05-31 SURGERY — ESOPHAGOGASTRODUODENOSCOPY (EGD) WITH ESOPHAGEAL DILATION
Anesthesia: Moderate Sedation

## 2011-05-31 MED ORDER — MEPERIDINE HCL 100 MG/ML IJ SOLN
INTRAMUSCULAR | Status: DC | PRN
  Administered 2011-05-31 (×2): 50 mg via INTRAVENOUS

## 2011-05-31 MED ORDER — MIDAZOLAM HCL 5 MG/5ML IJ SOLN
INTRAMUSCULAR | Status: DC | PRN
  Administered 2011-05-31 (×2): 2 mg via INTRAVENOUS
  Administered 2011-05-31: 1 mg via INTRAVENOUS

## 2011-05-31 MED ORDER — MEPERIDINE HCL 100 MG/ML IJ SOLN
INTRAMUSCULAR | Status: AC
  Filled 2011-05-31: qty 2

## 2011-05-31 MED ORDER — MIDAZOLAM HCL 5 MG/5ML IJ SOLN
INTRAMUSCULAR | Status: AC
  Filled 2011-05-31: qty 10

## 2011-05-31 MED ORDER — STERILE WATER FOR IRRIGATION IR SOLN
Status: DC | PRN
  Administered 2011-05-31: 10:00:00

## 2011-05-31 MED ORDER — BUTAMBEN-TETRACAINE-BENZOCAINE 2-2-14 % EX AERO
INHALATION_SPRAY | CUTANEOUS | Status: DC | PRN
  Administered 2011-05-31: 2 via TOPICAL

## 2011-05-31 NOTE — H&P (View-Only) (Signed)
Primary Care Physician:  MOORE, DONALD, MD, MD  Primary Gastroenterologist:  Michael Rourk, MD   Chief Complaint  Patient presents with  . Dysphagia    HPI:  Sheila [name] is a 45 y.o. female here for further evaluation of esophageal dysphagia. We last saw her last year at time of colonoscopy. She has h/o pelvic abscess involving right fallopian tube/ovary which presumably developed after complicated diverticulitis. She ultimately had right salpingoophorectomy. One week later she had worsening pelvic abscess and had percutaneous pigtail catheter drainage.   She states she has fully recovered. She complains of chronic esophageal dysphagia. Difficulty swallowing meat and rice. Sometimes she has to bring the food back up. Happening more frequently. Tries to eat slow. Occasional belching. No heartburn. Bloating before she started food enzymes. Takes fiber twice a day. No melena, brbpr. Sister and father have swallowing issues as well but have not been evaluated.   Current Outpatient Prescriptions  Medication Sig Dispense Refill  . FIBER PO Take by mouth 2 (two) times daily.      . ibuprofen (ADVIL,MOTRIN) 800 MG tablet Take 800 mg by mouth every 6 (six) hours as needed. For pain       . omeprazole (PRILOSEC OTC) 20 MG tablet Take 20 mg by mouth daily.      . Probiotic Product (ALIGN) 4 MG CAPS Take 1 capsule by mouth daily.  31 capsule  11  . SUMAtriptan (IMITREX) 100 MG tablet Take 1 tablet by mouth as needed.        Allergies as of 05/15/2011 - Review Complete 05/15/2011  Allergen Reaction Noted  . Lactose intolerance (gi)  02/18/2011  . Sulfa antibiotics Rash 10/25/2010    Past Medical History  Diagnosis Date  . Diverticulitis   . Migraine headache   . Pelvic abscess in female     see PSH    Past Surgical History  Procedure Date  . Colonoscopy 12/05/2010    left sided diverticulosis, melanosis coli. Next TCS at age 52. Procedure: COLONOSCOPY;  Surgeon: Robert M Rourk, MD;   Location: [facility];  Service: Endoscopy;  Laterality: N/A;  8:15AM  . Tonsillectomy     as child  . Cholecystectomy        . Dilation and curettage of uterus 02/06/2011    Procedure: DILATATION AND CURETTAGE (D&C);  Surgeon: John V Ferguson, MD;  Location: [facility];  Service: Gynecology;  Laterality: N/A;  . Salpingoophorectomy 02/06/2011    Procedure: SALPINGO OOPHERECTOMY;  Surgeon: John V Ferguson, MD;  Location: [facility];  Service: Gynecology;  Laterality: N/A;  procedure started at 1241  . Laparoscopy 02/06/2011    Procedure: LAPAROSCOPY DIAGNOSTIC;  Surgeon: John V Ferguson, MD;  Location: [facility];  Service: Gynecology;  Laterality: N/A;    Family History  Problem Relation Age of Onset  . Colon cancer Neg Hx   . Hypertension Father   . Hyperlipidemia Father     History   Social History  . Marital Status: Divorced    Spouse Name: N/A    Number of Children: 2  . Years of Education: N/A   Occupational History  . full-time     insurance    Social History Main Topics  . Smoking status: Never Smoker   . Smokeless tobacco: Never Used  . Alcohol Use: No  . Drug Use: No  . Sexually Active: No   Other Topics Concern  . Not on file   Social History Narrative  . No narrative   on file      ROS:  General: Negative for anorexia, weight loss, fever, chills, fatigue, weakness. Eyes: Negative for vision changes.  ENT: Negative for hoarseness,  nasal congestion. CV: Negative for chest pain, angina, palpitations, dyspnea on exertion, peripheral edema.  Respiratory: Negative for dyspnea at rest, dyspnea on exertion, cough, sputum, wheezing.  GI: See history of present illness. GU:  Negative for dysuria, hematuria, urinary incontinence, urinary frequency, nocturnal urination.  MS: Negative for joint pain, low back pain.  Derm: Negative for rash or itching.  Neuro: Negative for weakness, abnormal sensation, seizure, frequent headaches, memory loss, confusion.  Psych: Negative  for anxiety, depression, suicidal ideation, hallucinations.  Endo: Negative for unusual weight change.  Heme: Negative for bruising or bleeding. Allergy: Negative for rash or hives.    Physical Examination:  BP 130/82  Pulse 75  Temp(Src) 97.3 F (36.3 C) (Temporal)  Ht 5' 4" (1.626 m)  Wt 172 lb (78.019 kg)  BMI 29.52 kg/m2  LMP 05/15/2011   General: Well-nourished, well-developed in no acute distress.  Head: Normocephalic, atraumatic.   Eyes: Conjunctiva pink, no icterus. Mouth: Oropharyngeal mucosa moist and pink , no lesions erythema or exudate. Neck: Supple without thyromegaly, masses, or lymphadenopathy.  Lungs: Clear to auscultation bilaterally.  Heart: Regular rate and rhythm, no murmurs rubs or gallops.  Abdomen: Bowel sounds are normal, nontender, nondistended, no hepatosplenomegaly or masses, no abdominal bruits or    hernia , no rebound or guarding.   Rectal: Not performed. Extremities: No lower extremity edema. No clubbing or deformities.  Neuro: Alert and oriented x 4 , grossly normal neurologically.  Skin: Warm and dry, no rash or jaundice.   Psych: Alert and cooperative, normal mood and affect.     

## 2011-05-31 NOTE — Interval H&P Note (Signed)
History and Physical Interval Note:  05/31/2011 9:46 AM  Sheila [name]  has presented today for surgery, with the diagnosis of dysphagia  The various methods of treatment have been discussed with the patient and family. After consideration of risks, benefits and other options for treatment, the patient has consented to  Procedure(s) (LRB): ESOPHAGOGASTRODUODENOSCOPY (EGD) WITH ESOPHAGEAL DILATION (N/A) as a surgical intervention .  The patients' history has been reviewed, patient examined, no change in status, stable for surgery.  I have reviewed the patients' chart and labs.  Questions were answered to the patient's satisfaction.     [name]

## 2011-05-31 NOTE — Discharge Instructions (Addendum)
EGD Discharge instructions Please read the instructions outlined below and refer to this sheet in the next few weeks. These discharge instructions provide you with general information on caring for yourself after you leave the hospital. Your doctor may also give you specific instructions. While your treatment has been planned according to the most current medical practices available, unavoidable complications occasionally occur. If you have any problems or questions after discharge, please call your doctor. ACTIVITY  You may resume your regular activity but move at a slower pace for the next 24 hours.   Take frequent rest periods for the next 24 hours.   Walking will help expel (get rid of) the air and reduce the bloated feeling in your abdomen.   No driving for 24 hours (because of the anesthesia (medicine) used during the test).   You may shower.   Do not sign any important legal documents or operate any machinery for 24 hours (because of the anesthesia used during the test).  NUTRITION  Drink plenty of fluids.   You may resume your normal diet.   Begin with a light meal and progress to your normal diet.   Avoid alcoholic beverages for 24 hours or as instructed by your caregiver.  MEDICATIONS  You may resume your normal medications unless your caregiver tells you otherwise.  WHAT YOU CAN EXPECT TODAY  You may experience abdominal discomfort such as a feeling of fullness or "gas" pains.  FOLLOW-UP  Your doctor will discuss the results of your test with you.  SEEK IMMEDIATE MEDICAL ATTENTION IF ANY OF THE FOLLOWING OCCUR:  Excessive nausea (feeling sick to your stomach) and/or vomiting.   Severe abdominal pain and distention (swelling).   Trouble swallowing.   Temperature over 101 F (37.8 C).   Rectal bleeding or vomiting of blood.   Current information provided.  Continue omeprazole 20 mg daily.  Soft diet  Further recommendations to follow pending review of  pathology report

## 2011-05-31 NOTE — Op Note (Signed)
[facility] [address] [place], [place]  [address]  ENDOSCOPY PROCEDURE REPORT  PATIENT:  Sheila [name], Sheila [name]  MR#:  [id number] BIRTHDATE:  Feb 08, 1966, 45 yrs. old  GENDER:  female  ENDOSCOPIST:  R. Roetta Sessions, MD Caleen Essex Referred by:  Gweneth Dimitri, M.D. Rudi Heap, M.D.  PROCEDURE DATE:  05/31/2011 PROCEDURE:  EGD with esophageal biopsy (incidental dilation of the esophagus with scope passage)  INDICATIONS:  esophageal dysphagia  INFORMED CONSENT:   The risks, benefits, limitations, alternatives and imponderables have been discussed.  The potential for biopsy, esophogeal dilation, etc. have also been reviewed.  Questions have been answered.  All parties agreeable.  Please see the history and physical in the medical record for more information.  MEDICATIONS:    Versed 5 mg IV and Demerol 100 mg IV in divided doses. Cetacaine spray.  DESCRIPTION OF PROCEDURE:   The EG-[id number] ([id number]) endoscope was introduced through the mouth and advanced to the second portion of the duodenum without difficulty or limitations.  The mucosal surfaces were surveyed very carefully during advancement of the scope and upon withdrawal.  Retroflexion view of the proximal stomach and esophagogastric junction was performed.  <<PROCEDUREIMAGES>>  FINDINGS:   entire tubular esophagus pale with a "ringed" appearing mucosa with longitudinal folds. Mucosa was  very tender and bled with minimal scope trauma; there appeared to be a sentinel ring at the GE junction which was torn with passage of the diagnostic gastroscope. Stomach empty. Moderate size hiatal hernia; otherwise normal gastric mucosa. Patent pylorus normal first and second portion of the duodenum  THERAPEUTIC / DIAGNOSTIC MANEUVERS PERFORMED:    multiple biopsies of the distal and mid esophagus taken for histologic study. No further dilation attempted beyond that which occurred with passage of the scope.  COMPLICATIONS:    None  IMPRESSION:   Abnormal esophagus. Appearance is suspicious for Eosinophilic esophagitis-status post biopsy. Incidental dilation of the                   most distal esophageal ring with scope passage.  RECOMMENDATIONS:    Continue Prilosec 20 mg daily. Followup on pathology.  ______________________________ R. Roetta Sessions, MD Caleen Essex  CC:  n. eSIGNED:   R. Roetta Sessions at 05/31/2011 10:29 AM  Lyndle Herrlich, [phone]

## 2011-06-06 NOTE — Progress Notes (Signed)
Results Cc to PCP  

## 2011-09-15 ENCOUNTER — Other Ambulatory Visit: Payer: Self-pay | Admitting: Obstetrics and Gynecology

## 2011-09-15 DIAGNOSIS — Z1231 Encounter for screening mammogram for malignant neoplasm of breast: Secondary | ICD-10-CM

## 2011-10-03 ENCOUNTER — Ambulatory Visit
Admission: RE | Admit: 2011-10-03 | Discharge: 2011-10-03 | Disposition: A | Discharge status: Home / Self Care | Payer: BC Managed Care – PPO | Source: Ambulatory Visit | Attending: Obstetrics and Gynecology | Admitting: Obstetrics and Gynecology

## 2011-10-03 DIAGNOSIS — Z1231 Encounter for screening mammogram for malignant neoplasm of breast: Secondary | ICD-10-CM

## 2011-10-18 ENCOUNTER — Other Ambulatory Visit: Payer: Self-pay | Admitting: Obstetrics and Gynecology

## 2011-10-18 ENCOUNTER — Other Ambulatory Visit (HOSPITAL_COMMUNITY)
Admission: RE | Admit: 2011-10-18 | Discharge: 2011-10-18 | Disposition: A | Discharge status: Home / Self Care | Payer: BC Managed Care – PPO | Source: Ambulatory Visit | Attending: Obstetrics and Gynecology | Admitting: Obstetrics and Gynecology

## 2011-10-18 DIAGNOSIS — Z01419 Encounter for gynecological examination (general) (routine) without abnormal findings: Secondary | ICD-10-CM | POA: Insufficient documentation

## 2012-02-23 ENCOUNTER — Encounter: Payer: Self-pay | Admitting: Women's Health

## 2012-09-05 ENCOUNTER — Other Ambulatory Visit: Payer: Self-pay

## 2012-09-05 DIAGNOSIS — Z1231 Encounter for screening mammogram for malignant neoplasm of breast: Secondary | ICD-10-CM

## 2012-10-04 ENCOUNTER — Ambulatory Visit
Admission: RE | Admit: 2012-10-04 | Discharge: 2012-10-04 | Disposition: A | Discharge status: Home / Self Care | Payer: BC Managed Care – PPO | Source: Ambulatory Visit

## 2012-10-04 DIAGNOSIS — Z1231 Encounter for screening mammogram for malignant neoplasm of breast: Secondary | ICD-10-CM

## 2012-10-25 ENCOUNTER — Ambulatory Visit: Payer: Self-pay | Admitting: Family Medicine

## 2013-06-09 ENCOUNTER — Telehealth: Payer: Self-pay | Admitting: Nurse Practitioner

## 2013-06-09 ENCOUNTER — Ambulatory Visit (INDEPENDENT_AMBULATORY_CARE_PROVIDER_SITE_OTHER): Payer: BC Managed Care – PPO | Admitting: Family Medicine

## 2013-06-09 ENCOUNTER — Encounter (INDEPENDENT_AMBULATORY_CARE_PROVIDER_SITE_OTHER): Payer: Self-pay

## 2013-06-09 ENCOUNTER — Encounter: Payer: Self-pay | Admitting: Family Medicine

## 2013-06-09 VITALS — BP 118/79 | HR 106 | Temp 99.5°F | Ht 64.0 in | Wt 168.0 lb

## 2013-06-09 DIAGNOSIS — R6889 Other general symptoms and signs: Secondary | ICD-10-CM

## 2013-06-09 DIAGNOSIS — J069 Acute upper respiratory infection, unspecified: Secondary | ICD-10-CM

## 2013-06-09 LAB — POCT RAPID STREP A (OFFICE): RAPID STREP A SCREEN: NEGATIVE

## 2013-06-09 LAB — POCT INFLUENZA A/B
INFLUENZA A, POC: NEGATIVE
INFLUENZA B, POC: NEGATIVE

## 2013-06-09 MED ORDER — OSELTAMIVIR PHOSPHATE 75 MG PO CAPS: 75.0000 mg | ORAL_CAPSULE | Freq: Two times a day (BID) | ORAL | Status: DC

## 2013-06-09 MED ORDER — AZITHROMYCIN 250 MG PO TABS: ORAL_TABLET | ORAL | Status: DC

## 2013-06-09 NOTE — Progress Notes (Signed)
Subjective:    Patient ID: Sheila [name], female    DOB: [birth year], 48 y.o.   MRN: [id number]  HPI Patient here today for fever, cough, and body aches that started on Friday.     Patient Active Problem List   Diagnosis Date Noted  . Esophageal dysphagia 05/15/2011  . Diverticulosis of colon 05/15/2011  . Pelvic abscess in female, right tuboovarian 02/05/2011    Class: Acute  . Hypokalemia 02/01/2011   Outpatient Encounter Prescriptions as of 06/09/2013  Medication Sig  . busPIRone (BUSPAR) 5 MG tablet Take 5 mg by mouth daily as needed.  [name] ibuprofen (ADVIL,MOTRIN) 800 MG tablet Take 800 mg by mouth every 6 (six) hours as needed. For pain   . omeprazole (PRILOSEC OTC) 20 MG tablet Take 20 mg by mouth daily.  . SUMAtriptan (IMITREX) 100 MG tablet Take 1 tablet by mouth as needed.  . [DISCONTINUED] FIBER PO Take by mouth 2 (two) times daily.  . [DISCONTINUED] Probiotic Product (ALIGN) 4 MG CAPS Take 1 capsule by mouth daily.    Review of Systems  Constitutional: Positive for fever.  HENT: Negative.  Negative for sore throat.   Eyes: Negative.   Respiratory: Positive for cough and chest tightness.   Cardiovascular: Negative.   Gastrointestinal: Negative.  Negative for nausea.  Endocrine: Negative.   Genitourinary: Negative.   Musculoskeletal: Positive for myalgias.  Skin: Negative.   Allergic/Immunologic: Negative.   Neurological: Negative.   Hematological: Negative.   Psychiatric/Behavioral: Negative.        Objective:   Physical Exam  Nursing note and vitals reviewed. Constitutional: She is oriented to person, place, and time. She appears well-developed and well-nourished. No distress.  HENT:  Head: Normocephalic and atraumatic.  Right Ear: External ear normal.  Left Ear: External ear normal.  Nasal congestion bilaterally with turbinate swelling, also red throat posteriorly  Eyes: Conjunctivae and EOM are normal. Pupils are equal, round, and reactive to light.  Right eye exhibits no discharge. Left eye exhibits no discharge. No scleral icterus.  Neck: Normal range of motion. Neck supple. No thyromegaly present.  Cardiovascular: Normal rate, regular rhythm and normal heart sounds.  Exam reveals no gallop and no friction rub.   No murmur heard. Pulmonary/Chest: Effort normal and breath sounds normal. No respiratory distress. She has no wheezes. She has no rales. She exhibits no tenderness.  Raspy cough, no rales or wheezing  Abdominal: Soft. Bowel sounds are normal.  Musculoskeletal: Normal range of motion.  Lymphadenopathy:    She has no cervical adenopathy.  Neurological: She is alert and oriented to person, place, and time. No cranial nerve deficit.  Skin: Skin is warm and dry. No rash noted.  Psychiatric: She has a normal mood and affect. Her behavior is normal. Judgment and thought content normal.   BP 118/79  Pulse 106  Temp(Src) 99.5 F (37.5 C) (Oral)  Ht 5\' 4"  (1.626 m)  Wt 168 lb (76.204 kg)  BMI 28.82 kg/m2  LMP 06/02/2013 Results for orders placed in visit on 06/09/13  POCT INFLUENZA A/B      Result Value Ref Range   Influenza A, POC Negative     Influenza B, POC Negative            Assessment & Plan:  1. Flu-like symptoms - POCT Influenza A/B - oseltamivir (TAMIFLU) 75 MG capsule; Take 1 capsule (75 mg total) by mouth 2 (two) times daily.  Dispense: 10 capsule; Refill: 0 - azithromycin (ZITHROMAX) 250 MG  tablet; 2 the first day then one daily for infection until complete  Dispense: 6 tablet; Refill: 0  2. URI (upper respiratory infection) - azithromycin (ZITHROMAX) 250 MG tablet; 2 the first day then one daily for infection until complete  Dispense: 6 tablet; Refill: 0 Patient Instructions  Take medication as directed Do not return to work until you have a day at home without fever Take Tylenol or ibuprofen as needed for aches pains and fever Drink plenty of fluids Use saline nose spray for head congestion Take  Mucinex to keep congestion and loose in the chest      A rapid strep was done but  this result was not available before the patient left office Arrie Senate MD

## 2013-06-09 NOTE — Telephone Encounter (Signed)
appt today at 11:30 with moore

## 2013-06-09 NOTE — Patient Instructions (Signed)
Take medication as directed Do not return to work until you have a day at home without fever Take Tylenol or ibuprofen as needed for aches pains and fever Drink plenty of fluids Use saline nose spray for head congestion Take Mucinex to keep congestion and loose in the chest

## 2013-09-23 ENCOUNTER — Other Ambulatory Visit: Payer: Self-pay

## 2013-09-23 DIAGNOSIS — Z1231 Encounter for screening mammogram for malignant neoplasm of breast: Secondary | ICD-10-CM

## 2013-10-13 ENCOUNTER — Ambulatory Visit
Admission: RE | Admit: 2013-10-13 | Discharge: 2013-10-13 | Disposition: A | Discharge status: Home / Self Care | Payer: BC Managed Care – PPO | Source: Ambulatory Visit

## 2013-10-13 ENCOUNTER — Encounter (INDEPENDENT_AMBULATORY_CARE_PROVIDER_SITE_OTHER): Payer: Self-pay

## 2013-10-13 DIAGNOSIS — Z1231 Encounter for screening mammogram for malignant neoplasm of breast: Secondary | ICD-10-CM

## 2013-12-16 ENCOUNTER — Other Ambulatory Visit (HOSPITAL_COMMUNITY)
Admission: RE | Admit: 2013-12-16 | Discharge: 2013-12-16 | Disposition: A | Discharge status: Home / Self Care | Payer: BC Managed Care – PPO | Source: Ambulatory Visit | Attending: Family Medicine | Admitting: Family Medicine

## 2013-12-16 ENCOUNTER — Other Ambulatory Visit: Payer: Self-pay | Admitting: Family Medicine

## 2013-12-16 DIAGNOSIS — Z1151 Encounter for screening for human papillomavirus (HPV): Secondary | ICD-10-CM | POA: Diagnosis present

## 2013-12-16 DIAGNOSIS — Z124 Encounter for screening for malignant neoplasm of cervix: Secondary | ICD-10-CM | POA: Insufficient documentation

## 2013-12-18 LAB — CYTOLOGY - PAP

## 2013-12-30 ENCOUNTER — Telehealth: Payer: Self-pay | Admitting: Family Medicine

## 2013-12-30 NOTE — Telephone Encounter (Signed)
Patient is complaining with congestion, and sneezing I advised her to try some mucinex otc and if this does not help then to call and make apptointment. She has tried claritin.

## 2013-12-31 ENCOUNTER — Encounter: Payer: Self-pay | Admitting: Nurse Practitioner

## 2013-12-31 ENCOUNTER — Telehealth: Payer: Self-pay | Admitting: Nurse Practitioner

## 2013-12-31 ENCOUNTER — Ambulatory Visit (INDEPENDENT_AMBULATORY_CARE_PROVIDER_SITE_OTHER): Payer: BC Managed Care – PPO | Admitting: Nurse Practitioner

## 2013-12-31 VITALS — BP 112/72 | HR 84 | Temp 96.9°F | Ht 64.0 in | Wt 171.4 lb

## 2013-12-31 DIAGNOSIS — J069 Acute upper respiratory infection, unspecified: Secondary | ICD-10-CM | POA: Insufficient documentation

## 2013-12-31 DIAGNOSIS — J0120 Acute ethmoidal sinusitis, unspecified: Secondary | ICD-10-CM

## 2013-12-31 MED ORDER — AZITHROMYCIN 250 MG PO TABS: ORAL_TABLET | ORAL | Status: DC

## 2013-12-31 NOTE — Progress Notes (Signed)
   Subjective:    Patient ID: Sheila Sheila [name], female    DOB: 03/21/1966, 48 y.o.   MRN: [id number]  URI  This is a new problem. The current episode started yesterday. The problem has been gradually worsening. There has been no fever. Associated symptoms include congestion, coughing, headaches, rhinorrhea, sinus pain, sneezing and wheezing. Pertinent negatives include no nausea or sore throat. She has tried decongestant for the symptoms. The treatment provided no relief.      Review of Systems  Constitutional: Negative.   HENT: Positive for congestion, rhinorrhea and sneezing. Negative for sore throat.   Eyes: Negative.   Respiratory: Positive for cough and wheezing.   Cardiovascular: Negative.   Gastrointestinal: Negative.  Negative for nausea.  Endocrine: Negative.   Genitourinary: Negative.   Allergic/Immunologic: Negative.   Neurological: Positive for headaches.  Hematological: Negative.   Psychiatric/Behavioral: Negative.        Objective:   Physical Exam  Constitutional: She is oriented to person, place, and time. She appears well-developed and well-nourished.  HENT:  Head: Normocephalic.  Eyes: Pupils are equal, round, and reactive to light.  Neck: Normal range of motion.  Cardiovascular: Normal rate.   Pulmonary/Chest: Effort normal.  Abdominal: Soft.  Musculoskeletal: Normal range of motion.  Neurological: She is alert and oriented to person, place, and time.  Skin: Skin is warm.  Psychiatric: She has a normal mood and affect.    BP 112/72  Pulse 84  Temp(Src) 96.9 F (36.1 C) (Oral)  Ht 5\' 4"  (1.626 m)  Wt 171 lb 6.4 oz (77.747 kg)  BMI 29.41 kg/m2        Assessment & Plan:   1. URI (upper respiratory infection)   2. Acute ethmoidal sinusitis, recurrence not specified    Meds ordered this encounter  Medications                              . azithromycin (ZITHROMAX Z-PAK) 250 MG tablet    Sig: As directed    Dispense:  6 each    Refill:   0    Order Specific Question:  Supervising Provider    Answer:  Chipper Herb [[id number]]      1. Take meds as prescribed 2. Use a cool mist humidifier especially during the winter months and when heat has been humid. 3. Use saline nose sprays frequently 4. Saline irrigations of the nose can be very helpful if Sheila [name] frequently.  * 4X daily for 1 week*  * Use of a nettie pot can be helpful with this. Follow directions with this* 5. Drink plenty of fluids 6. Keep thermostat turn down low 7.For any cough or congestion  Use plain Mucinex- regular strength or max strength is fine   * Children- consult with Pharmacist for dosing 8. For fever or aces or pains- take tylenol or ibuprofen appropriate for age and weight.  * for fevers greater than 101 orally you may alternate ibuprofen and tylenol every  3 hours.   Sheila Hassell Done, FNP

## 2013-12-31 NOTE — Telephone Encounter (Signed)
Appt given

## 2013-12-31 NOTE — Patient Instructions (Addendum)

## 2014-02-09 ENCOUNTER — Encounter: Payer: Self-pay | Admitting: Nurse Practitioner

## 2014-03-24 ENCOUNTER — Other Ambulatory Visit: Payer: Self-pay | Admitting: Nurse Practitioner

## 2014-03-24 NOTE — Telephone Encounter (Signed)
OTC mucinex d

## 2014-03-24 NOTE — Telephone Encounter (Signed)
Pt notified of MMM reccomendations

## 2014-09-10 ENCOUNTER — Other Ambulatory Visit: Payer: Self-pay

## 2014-09-10 DIAGNOSIS — Z1231 Encounter for screening mammogram for malignant neoplasm of breast: Secondary | ICD-10-CM

## 2014-10-16 ENCOUNTER — Ambulatory Visit
Admission: RE | Admit: 2014-10-16 | Discharge: 2014-10-16 | Disposition: A | Discharge status: Home / Self Care | Payer: 59 | Source: Ambulatory Visit

## 2014-10-16 DIAGNOSIS — Z1231 Encounter for screening mammogram for malignant neoplasm of breast: Secondary | ICD-10-CM

## 2015-07-22 DIAGNOSIS — R1032 Left lower quadrant pain: Secondary | ICD-10-CM | POA: Diagnosis not present

## 2015-07-22 DIAGNOSIS — R1031 Right lower quadrant pain: Secondary | ICD-10-CM | POA: Diagnosis not present

## 2015-07-31 ENCOUNTER — Encounter (HOSPITAL_COMMUNITY): Payer: Self-pay | Admitting: Emergency Medicine

## 2015-07-31 ENCOUNTER — Inpatient Hospital Stay (HOSPITAL_COMMUNITY): Payer: BLUE CROSS/BLUE SHIELD

## 2015-07-31 ENCOUNTER — Inpatient Hospital Stay (HOSPITAL_COMMUNITY)
Admission: EM | Admit: 2015-07-31 | Discharge: 2015-08-03 | DRG: 872 | Disposition: A | Discharge status: Home / Self Care | Payer: BLUE CROSS/BLUE SHIELD | Attending: Internal Medicine | Admitting: Internal Medicine

## 2015-07-31 ENCOUNTER — Emergency Department (HOSPITAL_COMMUNITY): Payer: BLUE CROSS/BLUE SHIELD

## 2015-07-31 DIAGNOSIS — K5720 Diverticulitis of large intestine with perforation and abscess without bleeding: Secondary | ICD-10-CM

## 2015-07-31 DIAGNOSIS — K219 Gastro-esophageal reflux disease without esophagitis: Secondary | ICD-10-CM | POA: Diagnosis present

## 2015-07-31 DIAGNOSIS — D72829 Elevated white blood cell count, unspecified: Secondary | ICD-10-CM | POA: Diagnosis not present

## 2015-07-31 DIAGNOSIS — G4700 Insomnia, unspecified: Secondary | ICD-10-CM | POA: Diagnosis present

## 2015-07-31 DIAGNOSIS — K5792 Diverticulitis of intestine, part unspecified, without perforation or abscess without bleeding: Secondary | ICD-10-CM | POA: Insufficient documentation

## 2015-07-31 DIAGNOSIS — A419 Sepsis, unspecified organism: Principal | ICD-10-CM | POA: Diagnosis present

## 2015-07-31 DIAGNOSIS — E739 Lactose intolerance, unspecified: Secondary | ICD-10-CM | POA: Diagnosis present

## 2015-07-31 DIAGNOSIS — K5730 Diverticulosis of large intestine without perforation or abscess without bleeding: Secondary | ICD-10-CM | POA: Diagnosis not present

## 2015-07-31 DIAGNOSIS — K5780 Diverticulitis of intestine, part unspecified, with perforation and abscess without bleeding: Secondary | ICD-10-CM | POA: Diagnosis not present

## 2015-07-31 DIAGNOSIS — I10 Essential (primary) hypertension: Secondary | ICD-10-CM | POA: Diagnosis present

## 2015-07-31 DIAGNOSIS — R1032 Left lower quadrant pain: Secondary | ICD-10-CM | POA: Diagnosis not present

## 2015-07-31 DIAGNOSIS — F419 Anxiety disorder, unspecified: Secondary | ICD-10-CM | POA: Diagnosis present

## 2015-07-31 DIAGNOSIS — Z8249 Family history of ischemic heart disease and other diseases of the circulatory system: Secondary | ICD-10-CM

## 2015-07-31 DIAGNOSIS — E876 Hypokalemia: Secondary | ICD-10-CM | POA: Diagnosis present

## 2015-07-31 DIAGNOSIS — F411 Generalized anxiety disorder: Secondary | ICD-10-CM | POA: Diagnosis not present

## 2015-07-31 DIAGNOSIS — R1084 Generalized abdominal pain: Secondary | ICD-10-CM | POA: Diagnosis not present

## 2015-07-31 DIAGNOSIS — R509 Fever, unspecified: Secondary | ICD-10-CM | POA: Diagnosis not present

## 2015-07-31 DIAGNOSIS — R1030 Lower abdominal pain, unspecified: Secondary | ICD-10-CM | POA: Diagnosis not present

## 2015-07-31 LAB — COMPREHENSIVE METABOLIC PANEL
ALBUMIN: 4.4 g/dL (ref 3.5–5.0)
ALT: 30 U/L (ref 14–54)
AST: 20 U/L (ref 15–41)
Alkaline Phosphatase: 89 U/L (ref 38–126)
Anion gap: 13 (ref 5–15)
BUN: 11 mg/dL (ref 6–20)
CHLORIDE: 100 mmol/L — AB (ref 101–111)
CO2: 24 mmol/L (ref 22–32)
Calcium: 9.6 mg/dL (ref 8.9–10.3)
Creatinine, Ser: 0.65 mg/dL (ref 0.44–1.00)
GFR calc Af Amer: >60 mL/min (ref >60)
GFR calc non Af Amer: >60 mL/min (ref >60)
GLUCOSE: 95 mg/dL (ref 65–99)
POTASSIUM: 3.8 mmol/L (ref 3.5–5.1)
Sodium: 137 mmol/L (ref 135–145)
Total Bilirubin: 0.9 mg/dL (ref 0.3–1.2)
Total Protein: 9.1 g/dL — ABNORMAL HIGH (ref 6.5–8.1)

## 2015-07-31 LAB — URINALYSIS, ROUTINE W REFLEX MICROSCOPIC
Glucose, UA: NEGATIVE mg/dL
Hgb urine dipstick: NEGATIVE
KETONES UR: 15 mg/dL — AB
LEUKOCYTES UA: NEGATIVE
NITRITE: NEGATIVE
PH: 6 (ref 5.0–8.0)
PROTEIN: 30 mg/dL — AB
Specific Gravity, Urine: >1.03 — ABNORMAL HIGH (ref 1.005–1.030)

## 2015-07-31 LAB — PROTIME-INR
INR: 1.08 (ref 0.00–1.49)
PROTHROMBIN TIME: 14.2 s (ref 11.6–15.2)

## 2015-07-31 LAB — LACTIC ACID, PLASMA
LACTIC ACID, VENOUS: 0.8 mmol/L (ref 0.5–2.0)
Lactic Acid, Venous: 1.4 mmol/L (ref 0.5–2.0)

## 2015-07-31 LAB — CBC WITH DIFFERENTIAL/PLATELET
Basophils Absolute: 0 10*3/uL (ref 0.0–0.1)
Basophils Relative: 0 %
EOS PCT: 0 %
Eosinophils Absolute: 0 10*3/uL (ref 0.0–0.7)
HCT: 39.7 % (ref 36.0–46.0)
Hemoglobin: 13.7 g/dL (ref 12.0–15.0)
LYMPHS ABS: 1.5 10*3/uL (ref 0.7–4.0)
LYMPHS PCT: 8 %
MCH: 30.2 pg (ref 26.0–34.0)
MCHC: 34.5 g/dL (ref 30.0–36.0)
MCV: 87.6 fL (ref 78.0–100.0)
MONO ABS: 1.7 10*3/uL — AB (ref 0.1–1.0)
Monocytes Relative: 9 %
Neutro Abs: 16.2 10*3/uL — ABNORMAL HIGH (ref 1.7–7.7)
Neutrophils Relative %: 83 %
PLATELETS: 436 10*3/uL — AB (ref 150–400)
RBC: 4.53 MIL/uL (ref 3.87–5.11)
RDW: 12.3 % (ref 11.5–15.5)
WBC: 19.4 10*3/uL — AB (ref 4.0–10.5)

## 2015-07-31 LAB — URINE MICROSCOPIC-ADD ON

## 2015-07-31 LAB — APTT: aPTT: 36 seconds (ref 24–37)

## 2015-07-31 LAB — LIPASE, BLOOD: LIPASE: 37 U/L (ref 11–51)

## 2015-07-31 LAB — PREGNANCY, URINE: Preg Test, Ur: NEGATIVE

## 2015-07-31 MED ORDER — SODIUM CHLORIDE 0.9 % IV BOLUS (SEPSIS)
1000.0000 mL | Freq: Once | INTRAVENOUS | Status: AC
  Administered 2015-07-31: 1000 mL via INTRAVENOUS

## 2015-07-31 MED ORDER — ONDANSETRON HCL 4 MG/2ML IJ SOLN
4.0000 mg | Freq: Once | INTRAMUSCULAR | Status: AC
  Administered 2015-07-31: 4 mg via INTRAVENOUS
  Filled 2015-07-31: qty 2

## 2015-07-31 MED ORDER — ONDANSETRON HCL 4 MG/2ML IJ SOLN: 4.0000 mg | Freq: Three times a day (TID) | INTRAMUSCULAR | Status: DC | PRN

## 2015-07-31 MED ORDER — LISINOPRIL 5 MG PO TABS
5.0000 mg | ORAL_TABLET | Freq: Every day | ORAL | Status: DC
  Administered 2015-08-01 – 2015-08-03 (×3): 5 mg via ORAL
  Filled 2015-07-31 (×3): qty 1

## 2015-07-31 MED ORDER — SODIUM CHLORIDE 0.9 % IV SOLN: INTRAVENOUS | Status: DC

## 2015-07-31 MED ORDER — ACETAMINOPHEN 325 MG PO TABS
650.0000 mg | ORAL_TABLET | Freq: Four times a day (QID) | ORAL | Status: DC | PRN
  Administered 2015-08-01 – 2015-08-02 (×4): 650 mg via ORAL
  Filled 2015-07-31 (×5): qty 2

## 2015-07-31 MED ORDER — CIPROFLOXACIN IN D5W 400 MG/200ML IV SOLN
400.0000 mg | Freq: Once | INTRAVENOUS | Status: AC
  Administered 2015-07-31: 400 mg via INTRAVENOUS
  Filled 2015-07-31: qty 200

## 2015-07-31 MED ORDER — ONDANSETRON HCL 4 MG PO TABS: 4.0000 mg | ORAL_TABLET | Freq: Four times a day (QID) | ORAL | Status: DC | PRN

## 2015-07-31 MED ORDER — METRONIDAZOLE IN NACL 5-0.79 MG/ML-% IV SOLN
500.0000 mg | Freq: Three times a day (TID) | INTRAVENOUS | Status: DC
Start: 2015-08-01 — End: 2015-08-03
  Administered 2015-08-01 – 2015-08-03 (×8): 500 mg via INTRAVENOUS
  Filled 2015-07-31 (×8): qty 100

## 2015-07-31 MED ORDER — HYDROMORPHONE HCL 1 MG/ML IJ SOLN
1.0000 mg | INTRAMUSCULAR | Status: AC | PRN
Start: 2015-07-31 — End: 2015-08-01
  Administered 2015-08-01: 1 mg via INTRAVENOUS
  Filled 2015-07-31 (×2): qty 1

## 2015-07-31 MED ORDER — RISAQUAD PO CAPS
1.0000 | ORAL_CAPSULE | Freq: Every day | ORAL | Status: DC
  Administered 2015-08-01 – 2015-08-03 (×3): 1 via ORAL
  Filled 2015-07-31 (×4): qty 1

## 2015-07-31 MED ORDER — METRONIDAZOLE IN NACL 5-0.79 MG/ML-% IV SOLN
500.0000 mg | Freq: Once | INTRAVENOUS | Status: AC
  Administered 2015-07-31: 500 mg via INTRAVENOUS
  Filled 2015-07-31: qty 100

## 2015-07-31 MED ORDER — MORPHINE SULFATE (PF) 2 MG/ML IV SOLN
2.0000 mg | INTRAVENOUS | Status: DC | PRN
  Administered 2015-08-01: 2 mg via INTRAVENOUS
  Filled 2015-07-31 (×2): qty 1

## 2015-07-31 MED ORDER — MORPHINE SULFATE (PF) 4 MG/ML IV SOLN
4.0000 mg | Freq: Once | INTRAVENOUS | Status: AC
  Administered 2015-07-31: 4 mg via INTRAVENOUS
  Filled 2015-07-31: qty 1

## 2015-07-31 MED ORDER — PROBIOTIC DAILY PO CAPS: 1.0000 | ORAL_CAPSULE | Freq: Every day | ORAL | Status: DC

## 2015-07-31 MED ORDER — PROCHLORPERAZINE EDISYLATE 5 MG/ML IJ SOLN
10.0000 mg | Freq: Once | INTRAMUSCULAR | Status: AC
  Administered 2015-07-31: 10 mg via INTRAVENOUS
  Filled 2015-07-31: qty 2

## 2015-07-31 MED ORDER — ONDANSETRON HCL 4 MG/2ML IJ SOLN
4.0000 mg | Freq: Four times a day (QID) | INTRAMUSCULAR | Status: DC | PRN
  Administered 2015-08-01: 4 mg via INTRAVENOUS
  Filled 2015-07-31: qty 2

## 2015-07-31 MED ORDER — IOPAMIDOL (ISOVUE-300) INJECTION 61%
100.0000 mL | Freq: Once | INTRAVENOUS | Status: AC | PRN
  Administered 2015-07-31: 100 mL via INTRAVENOUS

## 2015-07-31 MED ORDER — SODIUM CHLORIDE 0.9 % IV SOLN
INTRAVENOUS | Status: AC
  Administered 2015-07-31: 19:00:00 via INTRAVENOUS

## 2015-07-31 MED ORDER — ACETAMINOPHEN 650 MG RE SUPP: 650.0000 mg | Freq: Four times a day (QID) | RECTAL | Status: DC | PRN

## 2015-07-31 MED ORDER — LORAZEPAM 0.5 MG PO TABS
0.5000 mg | ORAL_TABLET | Freq: Two times a day (BID) | ORAL | Status: DC | PRN
  Administered 2015-08-03: 1 mg via ORAL
  Filled 2015-07-31: qty 2

## 2015-07-31 MED ORDER — SUMATRIPTAN SUCCINATE 50 MG PO TABS: 100.0000 mg | ORAL_TABLET | ORAL | Status: DC | PRN

## 2015-07-31 MED ORDER — CIPROFLOXACIN IN D5W 400 MG/200ML IV SOLN
400.0000 mg | Freq: Two times a day (BID) | INTRAVENOUS | Status: DC
  Administered 2015-08-01 – 2015-08-03 (×5): 400 mg via INTRAVENOUS
  Filled 2015-07-31 (×5): qty 200

## 2015-07-31 MED ORDER — ALBUTEROL SULFATE (2.5 MG/3ML) 0.083% IN NEBU: 2.5000 mg | INHALATION_SOLUTION | RESPIRATORY_TRACT | Status: DC | PRN

## 2015-07-31 NOTE — ED Notes (Signed)
Phlebotomy at bedside.

## 2015-07-31 NOTE — ED Notes (Signed)
Pt states she has been having abd pain for about 2 weeks.  Went to [facility] and was sent here for further evaluation due to elevated WBC count.  Has been on antibiotics for over a week.  Being treated with Augmentin.

## 2015-07-31 NOTE — Progress Notes (Signed)
Pharmacy Antibiotic Note  Sheila [name] is a 50 y.o. female admitted on 07/31/2015 with intra-abdominal infection.  Pharmacy has been consulted for CIPRO AND FLAGYL dosing.  Plan: Cipro 400mg  IV q12h Flagyl 500mg  IV q8h Monitor labs, progress and c/s  Height: 5\' 5"  (165.1 cm) Weight: 157 lb (71.215 kg) IBW/kg (Calculated) : 57  Temp (24hrs), Avg:98.6 F (37 C), Min:98.4 F (36.9 C), Max:98.8 F (37.1 C)   Recent Labs Lab 07/31/15 1354 07/31/15 1512 07/31/15 1737  WBC 19.4*  --   --   CREATININE 0.65  --   --   LATICACIDVEN  --  1.4 0.8    Estimated Creatinine Clearance: 86 mL/min (by C-G formula based on Cr of 0.65).    Allergies  Allergen Reactions  . Lactose Intolerance (Gi)   . Sulfa Antibiotics Rash   Anti-infectives    Start     Dose/Rate Route Frequency Ordered Stop   08/01/15 0600  ciprofloxacin (CIPRO) IVPB 400 mg     400 mg 200 mL/hr over 60 Minutes Intravenous Every 12 hours 07/31/15 1940     08/01/15 0200  metroNIDAZOLE (FLAGYL) IVPB 500 mg     500 mg 100 mL/hr over 60 Minutes Intravenous Every 8 hours 07/31/15 1940     07/31/15 1700  ciprofloxacin (CIPRO) IVPB 400 mg     400 mg 200 mL/hr over 60 Minutes Intravenous  Once 07/31/15 1645 07/31/15 1755   07/31/15 1700  metroNIDAZOLE (FLAGYL) IVPB 500 mg     500 mg 100 mL/hr over 60 Minutes Intravenous  Once 07/31/15 1645 07/31/15 1755     Thank you for allowing pharmacy to be a part of this patient's care.  Hart Robinsons A 07/31/2015 7:40 PM

## 2015-07-31 NOTE — H&P (Addendum)
History and Physical    Sheila [name] [id number] DOB: January 02, 1966 DOA: 07/31/2015  Referring MD/NP/PA: Dr. Thurnell Garbe PCP: [name], FNP  Outpatient Specialists: N/a Patient coming from: Home  Chief Complaint: Abdominal pain  HPI: Sheila [name] is a 50 y.o. female with medical history significant of essential hypertension, anxiety, GERD, and diverticulitis with pelvic abscess; who presents with complaints of lower abdominal pain for the last 2 weeks. Symptoms started 4/10, after eating something that at that time she did not know had poppy seeds in it. Initially complained of bloating and generalized achiness later that night. Symptoms seem to wax and wane, and she went to her primary care physician's walk-in clinic 4 days later (4/13). At that time she was evaluated and questioned to have diverticulitis at that time, but was given Augmentin and discharged home without any imaging studies performed. Initially, she thought she was getting better with the antibiotic, but notes having intermittent low-grade fevers with temperatures up to 100F throughout the week. Also complaining of low back pain, 1-2 small loose stools/day, and generalized malaise. As symptoms were not improving after the Augmentin she went back to urgent care because she still was not feeling well and was sent here for further evaluation after her WBC was seen to be elevated. She has tried not to eat anything with seeds in it in over 3 years since her last bout of diverticulitis. Denies any chest pain, palpitations, nausea, vomiting, rash, lower leg swelling, diarrhea, blood, urinary frequency, or dysuria.    ED Course: Upon admission to the emergency department patient was evaluated and seen to have vitals relatively within normal limits; except for some mild tachycardia with heart rates up to 108. CT scan of the abdomen revealed perforated acute diverticulitis of the mid to distal sigmoid colon with abscess of the right  pelvic sidewall. Patient was started on ciprofloxacin and Flagyl and general surgery Dr. Aviva Signs was consulted and will see patient in the a.m.   Review of Systems: As per HPI otherwise 10 point review of systems negative.    Past Medical History  Diagnosis Date  . Diverticulitis   . Migraine headache   . Pelvic abscess in female     see [place]    Past Surgical History  Procedure Laterality Date  . Colonoscopy  12/05/2010    left sided diverticulosis, melanosis coli. Next TCS at age 71. Procedure: COLONOSCOPY;  Surgeon: Daneil Dolin, MD;  Location: [facility];  Service: Endoscopy;  Laterality: N/A;  8:15AM  . Tonsillectomy      as child  . Cholecystectomy         . Dilation and curettage of uterus  02/06/2011    Procedure: DILATATION AND CURETTAGE (D&C);  Surgeon: Jonnie Kind, MD;  Location: [facility];  Service: Gynecology;  Laterality: N/A;  . Salpingoophorectomy  02/06/2011    Procedure: SALPINGO OOPHERECTOMY;  Surgeon: Jonnie Kind, MD;  Location: [facility];  Service: Gynecology;  Laterality: N/A;  procedure started at 1241  . Laparoscopy  02/06/2011    Procedure: LAPAROSCOPY DIAGNOSTIC;  Surgeon: Jonnie Kind, MD;  Location: [facility];  Service: Gynecology;  Laterality: N/A;     reports that she has never smoked. She has never used smokeless tobacco. She reports that she does not drink alcohol or use illicit drugs.  Allergies  Allergen Reactions  . Lactose Intolerance (Gi)   . Sulfa Antibiotics Rash    Family History  Problem Relation Age of Onset  .  Colon cancer Neg Hx   . Hypertension Father   . Hyperlipidemia Father      Prior to Admission medications   Medication Sig Start Date End Date Taking? Authorizing Provider  acetaminophen (TYLENOL) 500 MG tablet Take 1,000 mg by mouth every 6 (six) hours as needed for mild pain.   Yes Historical Provider, MD  amoxicillin-clavulanate (AUGMENTIN) 875-125 MG tablet Take 1 tablet by mouth 2 (two) times daily.  Starting 07/22/15 x 10 days. 07/22/15  Yes Historical Provider, MD  ibuprofen (ADVIL,MOTRIN) 800 MG tablet Take 800 mg by mouth every 6 (six) hours as needed. For pain    Yes Historical Provider, MD  lisinopril (PRINIVIL,ZESTRIL) 5 MG tablet Take 5 mg by mouth daily.   Yes Historical Provider, MD  LORazepam (ATIVAN) 0.5 MG tablet TAKE 1/2 TO 1 TABLET BY MOUTH AS NEEDED FOR SLEEP AND ANXIETY 06/29/15  Yes Historical Provider, MD  omeprazole (PRILOSEC OTC) 20 MG tablet Take 20 mg by mouth daily.   Yes Historical Provider, MD  Probiotic Product (PROBIOTIC DAILY PO) Take 1 capsule by mouth daily.   Yes Historical Provider, MD  rizatriptan (MAXALT) 10 MG tablet Take 10 mg by mouth as needed for migraine. May repeat in 2 hours if needed   Yes Historical Provider, MD  azithromycin (ZITHROMAX Z-PAK) 250 MG tablet As directed Patient not taking: Reported on 07/31/2015 12/31/13   Mary-Margaret Hassell Done, FNP    Physical Exam: Filed Vitals:   07/31/15 1335 07/31/15 1636  BP: 144/87 135/68  Pulse: 108 98  Temp: 98.4 F (36.9 C)   TempSrc: Temporal   Resp: 16 14  Height: 5\' 5"  (1.651 m)   Weight: 71.215 kg (157 lb)   SpO2: 100% 99%      Constitutional: NAD, calm, comfortable Filed Vitals:   07/31/15 1335 07/31/15 1636  BP: 144/87 135/68  Pulse: 108 98  Temp: 98.4 F (36.9 C)   TempSrc: Temporal   Resp: 16 14  Height: 5\' 5"  (1.651 m)   Weight: 71.215 kg (157 lb)   SpO2: 100% 99%   Eyes: PERRL, lids and conjunctivae normal ENMT: Mucous membranes are moist. Posterior pharynx clear of any exudate or lesions.Normal dentition.  Neck: normal, supple, no masses, no thyromegaly Respiratory: clear to auscultation bilaterally, no wheezing, no crackles. Normal respiratory effort. No accessory muscle use.  Cardiovascular: Regular rate and rhythm, no murmurs / rubs / gallops. No extremity edema. 2+ pedal pulses. No carotid bruits.  Abdomen: Mild generalized tenderness to palpation of the lower abdominal  quadrants. Positive bowel sounds, no rebound or guarding noted. Musculoskeletal: no clubbing / cyanosis. No joint deformity upper and lower extremities. Good ROM, no contractures. Normal muscle tone.  Skin: no rashes, lesions, ulcers. No induration Neurologic: CN 2-12 grossly intact. Sensation intact, DTR normal. Strength 5/5 in all 4.  Psychiatric: Normal judgment and insight. Alert and oriented x 3. Normal mood.    Labs on Admission: I have personally reviewed following labs and imaging studies  CBC:  Recent Labs Lab 07/31/15 1354  WBC 19.4*  NEUTROABS 16.2*  HGB 13.7  HCT 39.7  MCV 87.6  PLT [id number]*   Basic Metabolic Panel:  Recent Labs Lab 07/31/15 1354  NA 137  K 3.8  CL 100*  CO2 24  GLUCOSE 95  BUN 11  CREATININE 0.65  CALCIUM 9.6   GFR: Estimated Creatinine Clearance: 86 mL/min (by C-G formula based on Cr of 0.65). Liver Function Tests:  Recent Labs Lab 07/31/15 1354  AST 20  ALT 30  ALKPHOS 89  BILITOT 0.9  PROT 9.1*  ALBUMIN 4.4    Recent Labs Lab 07/31/15 1354  LIPASE 37   No results for input(s): AMMONIA in the last 168 hours. Coagulation Profile: No results for input(s): INR, PROTIME in the last 168 hours. Cardiac Enzymes: No results for input(s): CKTOTAL, CKMB, CKMBINDEX, TROPONINI in the last 168 hours. BNP (last 3 results) No results for input(s): PROBNP in the last 8760 hours. HbA1C: No results for input(s): HGBA1C in the last 72 hours. CBG: No results for input(s): GLUCAP in the last 168 hours. Lipid Profile: No results for input(s): CHOL, HDL, LDLCALC, TRIG, CHOLHDL, LDLDIRECT in the last 72 hours. Thyroid Function Tests: No results for input(s): TSH, T4TOTAL, FREET4, T3FREE, THYROIDAB in the last 72 hours. Anemia Panel: No results for input(s): VITAMINB12, FOLATE, FERRITIN, TIBC, IRON, RETICCTPCT in the last 72 hours. Urine analysis:    Component Value Date/Time   COLORURINE YELLOW 07/31/2015 1350   APPEARANCEUR CLOUDY*  07/31/2015 1350   LABSPEC >1.030* 07/31/2015 1350   PHURINE 6.0 07/31/2015 1350   GLUCOSEU NEGATIVE 07/31/2015 1350   HGBUR NEGATIVE 07/31/2015 1350   BILIRUBINUR SMALL* 07/31/2015 1350   KETONESUR 15* 07/31/2015 1350   PROTEINUR 30* 07/31/2015 1350   UROBILINOGEN 0.2 02/18/2011 2131   NITRITE NEGATIVE 07/31/2015 1350   LEUKOCYTESUR NEGATIVE 07/31/2015 1350   Sepsis Labs:  Recent Results (from the past 240 hour(s))  Blood culture (routine x 2)     Status: None (Preliminary result)   Collection Time: 07/31/15  3:12 PM  Result Value Ref Range Status   Specimen Description BLOOD LEFT ANTECUBITAL  Final   Special Requests BOTTLES DRAWN AEROBIC AND ANAEROBIC 10CC  Final   Culture PENDING  Incomplete   Report Status PENDING  Incomplete  Blood culture (routine x 2)     Status: None (Preliminary result)   Collection Time: 07/31/15  3:16 PM  Result Value Ref Range Status   Specimen Description BLOOD BLOOD LEFT HAND  Final   Special Requests BOTTLES DRAWN AEROBIC ONLY 2CC  Final   Culture PENDING  Incomplete   Report Status PENDING  Incomplete     Radiological Exams on Admission: Ct Abdomen Pelvis W Contrast  07/31/2015  CLINICAL DATA:  Left lower quadrant pain and leukocytosis. History of diverticulitis on antibiotics for 1 week. Cholecystectomy . EXAM: CT ABDOMEN AND PELVIS WITH CONTRAST TECHNIQUE: Multidetector CT imaging of the abdomen and pelvis was performed using the standard protocol following bolus administration of intravenous contrast. CONTRAST:  156mL ISOVUE-300 IOPAMIDOL (ISOVUE-300) INJECTION 61% COMPARISON:  02/23/2011 CT abdomen/ pelvis. FINDINGS: Lower chest: No significant pulmonary nodules or acute consolidative airspace disease. Hepatobiliary: Normal liver with no liver mass. Cholecystectomy. Bile ducts are within expected post cholecystectomy limits with mild central intrahepatic biliary ductal dilatation and common bile duct diameter 7 mm. No radiopaque  choledocholithiasis. Pancreas: Normal, with no mass or duct dilation. Spleen: Normal size. No mass. Adrenals/Urinary Tract: Normal adrenals. Subcentimeter hypodense renal cortical lesion in the anterior upper left kidney is too small to characterize and not definitely changed since 2012, in keeping with a benign renal cyst. No new renal masses. No hydronephrosis. Mild reactive wall thickening in the posterior bladder. Stomach/Bowel: Small hiatal hernia. Otherwise grossly normal stomach. Normal caliber small bowel with no small bowel wall thickening. Normal appendix. Mobile cecum in the anterior right abdomen near the midline. There is severe wall thickening and hyperenhancement in the mid to distal sigmoid colon with associated prominent pericolonic  fat stranding with underlying mild sigmoid diverticulosis, in keeping with acute sigmoid diverticulitis. There is an irregular right pelvic sidewall abscess with thick enhancing wall and internal gas extending inferiorly from the site of acute sigmoid diverticulitis, measuring 3.7 x 1.7 cm more superiorly (series 2/ image 59) and 2.9 x 2.8 cm more inferiorly (series 2/ image 63). Vascular/Lymphatic: Normal caliber abdominal aorta. Patent portal, splenic, hepatic and renal veins. No pathologically enlarged lymph nodes in the abdomen or pelvis. Reproductive: Grossly normal anteverted uterus. No adnexal mass (the right pelvic sidewall abscess abuts the posterior/ inferior margin of the normal size right ovary). Other: Trace free fluid in the deep pelvis. Musculoskeletal: No aggressive appearing focal osseous lesions. IMPRESSION: 1. Perforated acute diverticulitis of the mid to distal sigmoid colon, with associated irregular small to moderate gas containing abscess in the right pelvic sidewall as described. 2. Mild reactive wall thickening in the posterior bladder. 3. Small hiatal hernia. Electronically Signed   By: Ilona Sorrel M.D.   On: 07/31/2015 16:35      Assessment/Plan Sepsis 2/2 diverticulitis of intestine with perforation and abscess without bleeding: Acute. Patient with generalized abdominal pain over the last 2 weeks. WBC 19.4, HR up to 108, and lactic acid 1.4. CT scan showing acute diverticulitis of the sigmoid colon with perforation and abscess of the right pelvic sidewall. General surgery consult to call patient in ED Dr. Aviva Signs states will see in a.m. - Admit to a MedSurg bed  - Antibiotics of ciprofloxacin and Flagyl - Check blood cultures - IVF NS at 125 ml/hr - CBC in a.m.  - Clear liquid diet now, and NPO after midnight - Gen. surgery to see in a.m. - Morphine prn pain  Generalized anxiety /insomnia - Lorazepam prn  Essential hypertension - Continue lisinopril  Jerrye Bushy - held omeprazole and given protonix IV for now  DVT prophylaxis: SCDs for now Code Status: Full Family Communication: Husband in the room while plan discussed Disposition Plan: Expect a 2-3 day hospitalization stay Consults called: Dr. Aviva Signs surgery ion status: Inpatient MedSurg    Norval Morton MD [facility] Pager [phone]  If 7PM-7AM, please contact night-coverage [email or web address] Password [facility]  07/31/2015, 5:04 PM

## 2015-07-31 NOTE — ED Provider Notes (Signed)
Pt received at sign out with CT A/P pending.  Pt remains A&O, resps easy, TTP LLQ, neuro non-focal. VSS, afebrile. CT scan as below. IV cipro/flagyl started. BC x2 pending. T/C to General Surgery [name], case discussed, including:  HPI, pertinent PM/SHx, VS/PE, dx testing, ED course and treatment:  Agreeable to consult, likely IR to drain abscess tomorrow, requests to admit to [facility]. T/C to [facility] Dr. Tamala Julian, case discussed, including:  HPI, pertinent PM/SHx, VS/PE, dx testing, ED course and treatment:  Agreeable to admit, requests to write temporary orders, obtain medical bed to team [facility].    Ct Abdomen Pelvis W Contrast 07/31/2015  CLINICAL DATA:  Left lower quadrant pain and leukocytosis. History of diverticulitis on antibiotics for 1 week. Cholecystectomy . EXAM: CT ABDOMEN AND PELVIS WITH CONTRAST TECHNIQUE: Multidetector CT imaging of the abdomen and pelvis was performed using the standard protocol following bolus administration of intravenous contrast. CONTRAST:  147mL ISOVUE-300 IOPAMIDOL (ISOVUE-300) INJECTION 61% COMPARISON:  02/23/2011 CT abdomen/ pelvis. FINDINGS: Lower chest: No significant pulmonary nodules or acute consolidative airspace disease. Hepatobiliary: Normal liver with no liver mass. Cholecystectomy. Bile ducts are within expected post cholecystectomy limits with mild central intrahepatic biliary ductal dilatation and common bile duct diameter 7 mm. No radiopaque choledocholithiasis. Pancreas: Normal, with no mass or duct dilation. Spleen: Normal size. No mass. Adrenals/Urinary Tract: Normal adrenals. Subcentimeter hypodense renal cortical lesion in the anterior upper left kidney is too small to characterize and not definitely changed since 2012, in keeping with a benign renal cyst. No new renal masses. No hydronephrosis. Mild reactive wall thickening in the posterior bladder. Stomach/Bowel: Small hiatal hernia. Otherwise grossly normal stomach. Normal caliber small bowel  with no small bowel wall thickening. Normal appendix. Mobile cecum in the anterior right abdomen near the midline. There is severe wall thickening and hyperenhancement in the mid to distal sigmoid colon with associated prominent pericolonic fat stranding with underlying mild sigmoid diverticulosis, in keeping with acute sigmoid diverticulitis. There is an irregular right pelvic sidewall abscess with thick enhancing wall and internal gas extending inferiorly from the site of acute sigmoid diverticulitis, measuring 3.7 x 1.7 cm more superiorly (series 2/ image 59) and 2.9 x 2.8 cm more inferiorly (series 2/ image 63). Vascular/Lymphatic: Normal caliber abdominal aorta. Patent portal, splenic, hepatic and renal veins. No pathologically enlarged lymph nodes in the abdomen or pelvis. Reproductive: Grossly normal anteverted uterus. No adnexal mass (the right pelvic sidewall abscess abuts the posterior/ inferior margin of the normal size right ovary). Other: Trace free fluid in the deep pelvis. Musculoskeletal: No aggressive appearing focal osseous lesions. IMPRESSION: 1. Perforated acute diverticulitis of the mid to distal sigmoid colon, with associated irregular small to moderate gas containing abscess in the right pelvic sidewall as described. 2. Mild reactive wall thickening in the posterior bladder. 3. Small hiatal hernia. Electronically Signed   By: Ilona Sorrel M.D.   On: 07/31/2015 16:35     Francine Graven, DO 07/31/15 1711

## 2015-07-31 NOTE — ED Provider Notes (Signed)
CSN: [id number]     Arrival date & time 07/31/15  1331 History   First MD Initiated Contact with Patient 07/31/15 1342     Chief Complaint  Patient presents with  . Abdominal Pain   PT HAS A HX OF DIVERTICULITIS AND WENT TO HER PCP LAST WEEK FOR SIMILAR SX.  SHE WAS PUT ON AUGMENTIN AND HAS BEEN TAKING THEM SINCE 4/13.  PT WENT TO URGENT CARE TODAY B/C SHE WAS NOT FEELING ANY BETTER.  THE PT SAID THAT THEY DID A CBC AND HER WBC WAS HIGH, SO THEY SENT HER HERE.   (Consider location/radiation/quality/duration/timing/severity/associated sxs/prior Treatment) Patient is a 50 y.o. female presenting with abdominal pain. The history is provided by the patient.  Abdominal Pain Pain location:  Generalized Pain quality: fullness   Pain radiates to:  Does not radiate Pain severity:  Moderate Onset quality:  Gradual Timing:  Constant Associated symptoms: chills, fever, nausea and vomiting     Past Medical History  Diagnosis Date  . Diverticulitis   . Migraine headache   . Pelvic abscess in female     see [place]   Past Surgical History  Procedure Laterality Date  . Colonoscopy  12/05/2010    left sided diverticulosis, melanosis coli. Next TCS at age 41. Procedure: COLONOSCOPY;  Surgeon: Daneil Dolin, MD;  [facility];  Service: Endoscopy;  Laterality: N/A;  8:15AM  . Tonsillectomy      as child  . Cholecystectomy         . Dilation and curettage of uterus  02/06/2011    Procedure: DILATATION AND CURETTAGE (D&C);  Surgeon: Jonnie Kind, MD;  Location: [facility];  Service: Gynecology;  Laterality: N/A;  . Salpingoophorectomy  02/06/2011    Procedure: SALPINGO OOPHERECTOMY;  Surgeon: Jonnie Kind, MD;  Location: [facility];  Service: Gynecology;  Laterality: N/A;  procedure started at 1241  . Laparoscopy  02/06/2011    Procedure: LAPAROSCOPY DIAGNOSTIC;  Surgeon: Jonnie Kind, MD;  Location: [facility];  Service: Gynecology;  Laterality: N/A;   Family History  Problem Relation Age  of Onset  . Colon cancer Neg Hx   . Hypertension Father   . Hyperlipidemia Father    Social History  Substance Use Topics  . Smoking status: Never Smoker   . Smokeless tobacco: Never Used  . Alcohol Use: No   OB History    Gravida Para Term Preterm AB TAB SAB Ectopic Multiple Living   4 2   2  2   2      Review of Systems  Constitutional: Positive for fever and chills.  Gastrointestinal: Positive for nausea, vomiting and abdominal pain.  All other systems reviewed and are negative.     Allergies  Lactose intolerance (gi) and Sulfa antibiotics  Home Medications   Prior to Admission medications   Medication Sig Start Date End Date Taking? Authorizing Provider  azithromycin (ZITHROMAX Z-PAK) 250 MG tablet As directed 12/31/13   Mary-Margaret Hassell Done, FNP  azithromycin (ZITHROMAX) 250 MG tablet 2 the first day then one daily for infection until complete 06/09/13   Chipper Herb, MD  busPIRone (BUSPAR) 5 MG tablet Take 5 mg by mouth daily as needed.    Historical Provider, MD  ibuprofen (ADVIL,MOTRIN) 800 MG tablet Take 800 mg by mouth every 6 (six) hours as needed. For pain     Historical Provider, MD  lisinopril (PRINIVIL,ZESTRIL) 5 MG tablet Take 5 mg by mouth daily.    Historical  Provider, MD  omeprazole (PRILOSEC OTC) 20 MG tablet Take 20 mg by mouth daily.    Historical Provider, MD  rizatriptan (MAXALT) 10 MG tablet Take 10 mg by mouth as needed for migraine. May repeat in 2 hours if needed    Historical Provider, MD  terbinafine (LAMISIL) 250 MG tablet Take 250 mg by mouth daily.    Historical Provider, MD  traZODone (DESYREL) 50 MG tablet Take 50 mg by mouth at bedtime.    Historical Provider, MD   BP 144/87 mmHg  Pulse 108  Temp(Src) 98.4 F (36.9 C) (Temporal)  Resp 16  Ht 5\' 5"  (1.651 m)  Wt 157 lb (71.215 kg)  BMI 26.13 kg/m2  SpO2 100%  LMP 09/28/2014 Physical Exam  Constitutional: She is oriented to person, place, and time. She appears well-developed and  well-nourished.  HENT:  Head: Normocephalic and atraumatic.  Mouth/Throat: Oropharynx is clear and moist.  Eyes: Conjunctivae and EOM are normal. Pupils are equal, round, and reactive to light.  Neck: Normal range of motion. Neck supple.  Cardiovascular: Normal rate, regular rhythm and normal heart sounds.   Pulmonary/Chest: Effort normal and breath sounds normal.  Abdominal: Soft. Bowel sounds are normal. There is tenderness.  Musculoskeletal: Normal range of motion.  Neurological: She is alert and oriented to person, place, and time.  Skin: Skin is warm and dry.  Psychiatric: She has a normal mood and affect.  Nursing note and vitals reviewed.   ED Course  Procedures (including critical care time) Labs Review Labs Reviewed  COMPREHENSIVE METABOLIC PANEL  CBC WITH DIFFERENTIAL/PLATELET  LIPASE, BLOOD  URINALYSIS, ROUTINE W REFLEX MICROSCOPIC (NOT AT [facility])  PREGNANCY, URINE    Imaging Review No results found. I have personally reviewed and evaluated these images and lab results as part of my medical decision-making.   EKG Interpretation None      MDM  PT'S PAIN HAS IMPROVED, BUT NAUSEA REMAINS.  PT WILL BE SIGNED OUT TO DR. Thurnell Garbe WITH RESULTS OF THE CT ABDOMEN PENDING. Final diagnoses:  None   ABDOMINAL PAIN NAUSEA AND VOMITING    Isla Pence, MD 08/01/15 [phone]

## 2015-08-01 DIAGNOSIS — A419 Sepsis, unspecified organism: Principal | ICD-10-CM

## 2015-08-01 DIAGNOSIS — K5780 Diverticulitis of intestine, part unspecified, with perforation and abscess without bleeding: Secondary | ICD-10-CM

## 2015-08-01 DIAGNOSIS — I10 Essential (primary) hypertension: Secondary | ICD-10-CM

## 2015-08-01 DIAGNOSIS — K219 Gastro-esophageal reflux disease without esophagitis: Secondary | ICD-10-CM

## 2015-08-01 LAB — BASIC METABOLIC PANEL
ANION GAP: 11 (ref 5–15)
BUN: 10 mg/dL (ref 6–20)
CALCIUM: 8.3 mg/dL — AB (ref 8.9–10.3)
CO2: 21 mmol/L — AB (ref 22–32)
Chloride: 105 mmol/L (ref 101–111)
Creatinine, Ser: 0.6 mg/dL (ref 0.44–1.00)
Glucose, Bld: 110 mg/dL — ABNORMAL HIGH (ref 65–99)
Potassium: 3.3 mmol/L — ABNORMAL LOW (ref 3.5–5.1)
SODIUM: 137 mmol/L (ref 135–145)

## 2015-08-01 LAB — CBC
HEMATOCRIT: 31.5 % — AB (ref 36.0–46.0)
Hemoglobin: 10.7 g/dL — ABNORMAL LOW (ref 12.0–15.0)
MCH: 29.9 pg (ref 26.0–34.0)
MCHC: 34 g/dL (ref 30.0–36.0)
MCV: 88 fL (ref 78.0–100.0)
Platelets: 331 10*3/uL (ref 150–400)
RBC: 3.58 MIL/uL — ABNORMAL LOW (ref 3.87–5.11)
RDW: 12.2 % (ref 11.5–15.5)
WBC: 13 10*3/uL — ABNORMAL HIGH (ref 4.0–10.5)

## 2015-08-01 MED ORDER — SODIUM CHLORIDE 0.9 % IV SOLN
INTRAVENOUS | Status: DC
  Administered 2015-08-02 (×2): via INTRAVENOUS

## 2015-08-01 MED ORDER — POTASSIUM CHLORIDE CRYS ER 20 MEQ PO TBCR
40.0000 meq | EXTENDED_RELEASE_TABLET | ORAL | Status: AC
  Administered 2015-08-01 (×2): 40 meq via ORAL
  Filled 2015-08-01 (×2): qty 2

## 2015-08-01 MED ORDER — PANTOPRAZOLE SODIUM 40 MG PO TBEC
40.0000 mg | DELAYED_RELEASE_TABLET | Freq: Every day | ORAL | Status: DC
  Administered 2015-08-01 – 2015-08-03 (×3): 40 mg via ORAL
  Filled 2015-08-01 (×3): qty 1

## 2015-08-01 MED ORDER — FLUCONAZOLE 100 MG PO TABS
100.0000 mg | ORAL_TABLET | Freq: Every day | ORAL | Status: DC
  Administered 2015-08-01 – 2015-08-03 (×3): 100 mg via ORAL
  Filled 2015-08-01 (×3): qty 1

## 2015-08-01 NOTE — Consult Note (Signed)
Reason for Consult: Sigmoid diverticulitis with contained perforation Referring Physician: Dr. Williemae Area [name] is an 50 y.o. female.  HPI: Patient is a 50 year old white female with a history of diverticulosis, status post left salpingo-oophorectomy for involvement of the left adnexa approximate 3 years ago who presents with one-week history of worsening lower abdominal pain. She was initially treated as an outpatient with Augmentin for presumed sigmoid diverticulitis. Her abdominal pain worsened and she developed fevers. She presented the emergency room and a CT scan the abdomen revealed distal sigmoid diverticulitis with contained perforation. She was also noted to have an elevated white blood cell count. She was admitted to the hospital and started on ciprofloxacin and Flagyl. This morning, she feels much better. She is passing gas. Her lower abdominal pain has significantly improved. Her white blood cell count is down to 13,000. This is her second episode of diverticulitis.  Past Medical History  Diagnosis Date  . Diverticulitis   . Migraine headache   . Pelvic abscess in female     see [facility]    Past Surgical History  Procedure Laterality Date  . Colonoscopy  12/05/2010    left sided diverticulosis, melanosis coli. Next TCS at age 55. Procedure: COLONOSCOPY;  Surgeon: Sheila Dolin, MD;  Location: [facility];  Service: Endoscopy;  Laterality: N/A;  8:15AM  . Tonsillectomy      as child  . Cholecystectomy         . Dilation and curettage of uterus  02/06/2011    Procedure: DILATATION AND CURETTAGE (D&C);  Surgeon: Sheila Kind, MD;  Location: [facility];  Service: Gynecology;  Laterality: N/A;  . Salpingoophorectomy  02/06/2011    Procedure: SALPINGO OOPHERECTOMY;  Surgeon: Sheila Kind, MD;  Location: [facility];  Service: Gynecology;  Laterality: N/A;  procedure started at 1241  . Laparoscopy  02/06/2011    Procedure: LAPAROSCOPY DIAGNOSTIC;  Surgeon: Sheila Kind, MD;   Location: [facility];  Service: Gynecology;  Laterality: N/A;    Family History  Problem Relation Age of Onset  . Colon cancer Neg Hx   . Hypertension Father   . Hyperlipidemia Father     Social History:  reports that she has never smoked. She has never used smokeless tobacco. She reports that she does not drink alcohol or use illicit drugs.  Allergies:  Allergies  Allergen Reactions  . Lactose Intolerance (Gi)   . Sulfa Antibiotics Rash    Medications: I have reviewed the patient's current medications.  Results for orders placed or performed during the hospital encounter of 07/31/15 (from the past 48 hour(s))  Urinalysis, Routine w reflex microscopic     Status: Abnormal   Collection Time: 07/31/15  1:50 PM  Result Value Ref Range   Color, Urine YELLOW YELLOW   APPearance CLOUDY (A) CLEAR   Specific Gravity, Urine >1.030 (H) 1.005 - 1.030   pH 6.0 5.0 - 8.0   Glucose, UA NEGATIVE NEGATIVE mg/dL   Hgb urine dipstick NEGATIVE NEGATIVE   Bilirubin Urine SMALL (A) NEGATIVE   Ketones, ur 15 (A) NEGATIVE mg/dL   Protein, ur 30 (A) NEGATIVE mg/dL   Nitrite NEGATIVE NEGATIVE   Leukocytes, UA NEGATIVE NEGATIVE  Pregnancy, urine     Status: None   Collection Time: 07/31/15  1:50 PM  Result Value Ref Range   Preg Test, Ur NEGATIVE NEGATIVE    Comment:        THE SENSITIVITY OF THIS METHODOLOGY IS >20 mIU/mL.  Urine microscopic-add on     Status: Abnormal   Collection Time: 07/31/15  1:50 PM  Result Value Ref Range   Squamous Epithelial / LPF 6-30 (A) NONE SEEN   WBC, UA 0-5 0 - 5 WBC/hpf   RBC / HPF 0-5 0 - 5 RBC/hpf   Bacteria, UA RARE (A) NONE SEEN  Comprehensive metabolic panel     Status: Abnormal   Collection Time: 07/31/15  1:54 PM  Result Value Ref Range   Sodium 137 135 - 145 mmol/L   Potassium 3.8 3.5 - 5.1 mmol/L   Chloride 100 (L) 101 - 111 mmol/L   CO2 24 22 - 32 mmol/L   Glucose, Bld 95 65 - 99 mg/dL   BUN 11 6 - 20 mg/dL   Creatinine, Ser 0.65 0.44 - 1.00  mg/dL   Calcium 9.6 8.9 - 10.3 mg/dL   Total Protein 9.1 (H) 6.5 - 8.1 g/dL   Albumin 4.4 3.5 - 5.0 g/dL   AST 20 15 - 41 U/L   ALT 30 14 - 54 U/L   Alkaline Phosphatase 89 38 - 126 U/L   Total Bilirubin 0.9 0.3 - 1.2 mg/dL   GFR calc non Af Amer >60 >60 mL/min   GFR calc Af Amer >60 >60 mL/min    Comment: (NOTE) The eGFR has been calculated using the CKD EPI equation. This calculation has not been validated in all clinical situations. eGFR's persistently <60 mL/min signify possible Chronic Kidney Disease.    Anion gap 13 5 - 15  CBC with Differential     Status: Abnormal   Collection Time: 07/31/15  1:54 PM  Result Value Ref Range   WBC 19.4 (H) 4.0 - 10.5 K/uL   RBC 4.53 3.87 - 5.11 MIL/uL   Hemoglobin 13.7 12.0 - 15.0 g/dL   HCT 39.7 36.0 - 46.0 %   MCV 87.6 78.0 - 100.0 fL   MCH 30.2 26.0 - 34.0 pg   MCHC 34.5 30.0 - 36.0 g/dL   RDW 12.3 11.5 - 15.5 %   Platelets 436 (H) 150 - 400 K/uL   Neutrophils Relative % 83 %   Neutro Abs 16.2 (H) 1.7 - 7.7 K/uL   Lymphocytes Relative 8 %   Lymphs Abs 1.5 0.7 - 4.0 K/uL   Monocytes Relative 9 %   Monocytes Absolute 1.7 (H) 0.1 - 1.0 K/uL   Eosinophils Relative 0 %   Eosinophils Absolute 0.0 0.0 - 0.7 K/uL   Basophils Relative 0 %   Basophils Absolute 0.0 0.0 - 0.1 K/uL  Lipase, blood     Status: None   Collection Time: 07/31/15  1:54 PM  Result Value Ref Range   Lipase 37 11 - 51 U/L  Protime-INR     Status: None   Collection Time: 07/31/15  1:54 PM  Result Value Ref Range   Prothrombin Time 14.2 11.6 - 15.2 seconds   INR 1.08 0.00 - 1.49  APTT     Status: None   Collection Time: 07/31/15  1:54 PM  Result Value Ref Range   aPTT 36 24 - 37 seconds  Blood culture (routine x 2)     Status: None (Preliminary result)   Collection Time: 07/31/15  3:12 PM  Result Value Ref Range   Specimen Description BLOOD LEFT ANTECUBITAL    Special Requests BOTTLES DRAWN AEROBIC AND ANAEROBIC 10CC    Culture PENDING    Report Status  PENDING   Lactic acid, plasma  Status: None   Collection Time: 07/31/15  3:12 PM  Result Value Ref Range   Lactic Acid, Venous 1.4 0.5 - 2.0 mmol/L  Blood culture (routine x 2)     Status: None (Preliminary result)   Collection Time: 07/31/15  3:16 PM  Result Value Ref Range   Specimen Description BLOOD BLOOD LEFT HAND    Special Requests BOTTLES DRAWN AEROBIC ONLY 2CC    Culture PENDING    Report Status PENDING   Lactic acid, plasma     Status: None   Collection Time: 07/31/15  5:37 PM  Result Value Ref Range   Lactic Acid, Venous 0.8 0.5 - 2.0 mmol/L  CBC     Status: Abnormal   Collection Time: 08/01/15  6:24 AM  Result Value Ref Range   WBC 13.0 (H) 4.0 - 10.5 K/uL   RBC 3.58 (L) 3.87 - 5.11 MIL/uL   Hemoglobin 10.7 (L) 12.0 - 15.0 g/dL    Comment: DELTA CHECK NOTED   HCT 31.5 (L) 36.0 - 46.0 %   MCV 88.0 78.0 - 100.0 fL   MCH 29.9 26.0 - 34.0 pg   MCHC 34.0 30.0 - 36.0 g/dL   RDW 12.2 11.5 - 15.5 %   Platelets 331 150 - 400 K/uL  Basic metabolic panel     Status: Abnormal   Collection Time: 08/01/15  6:24 AM  Result Value Ref Range   Sodium 137 135 - 145 mmol/L   Potassium 3.3 (L) 3.5 - 5.1 mmol/L   Chloride 105 101 - 111 mmol/L   CO2 21 (L) 22 - 32 mmol/L   Glucose, Bld 110 (H) 65 - 99 mg/dL   BUN 10 6 - 20 mg/dL   Creatinine, Ser 0.60 0.44 - 1.00 mg/dL   Calcium 8.3 (L) 8.9 - 10.3 mg/dL   GFR calc non Af Amer >60 >60 mL/min   GFR calc Af Amer >60 >60 mL/min    Comment: (NOTE) The eGFR has been calculated using the CKD EPI equation. This calculation has not been validated in all clinical situations. eGFR's persistently <60 mL/min signify possible Chronic Kidney Disease.    Anion gap 11 5 - 15    Ct Abdomen Pelvis W Contrast  07/31/2015  CLINICAL DATA:  Left lower quadrant pain and leukocytosis. History of diverticulitis on antibiotics for 1 week. Cholecystectomy . EXAM: CT ABDOMEN AND PELVIS WITH CONTRAST TECHNIQUE: Multidetector CT imaging of the abdomen  and pelvis was performed using the standard protocol following bolus administration of intravenous contrast. CONTRAST:  163m ISOVUE-300 IOPAMIDOL (ISOVUE-300) INJECTION 61% COMPARISON:  02/23/2011 CT abdomen/ pelvis. FINDINGS: Lower chest: No significant pulmonary nodules or acute consolidative airspace disease. Hepatobiliary: Normal liver with no liver mass. Cholecystectomy. Bile ducts are within expected post cholecystectomy limits with mild central intrahepatic biliary ductal dilatation and common bile duct diameter 7 mm. No radiopaque choledocholithiasis. Pancreas: Normal, with no mass or duct dilation. Spleen: Normal size. No mass. Adrenals/Urinary Tract: Normal adrenals. Subcentimeter hypodense renal cortical lesion in the anterior upper left kidney is too small to characterize and not definitely changed since 2012, in keeping with a benign renal cyst. No new renal masses. No hydronephrosis. Mild reactive wall thickening in the posterior bladder. Stomach/Bowel: Small hiatal hernia. Otherwise grossly normal stomach. Normal caliber small bowel with no small bowel wall thickening. Normal appendix. Mobile cecum in the anterior right abdomen near the midline. There is severe wall thickening and hyperenhancement in the mid to distal sigmoid colon with associated prominent pericolonic  fat stranding with underlying mild sigmoid diverticulosis, in keeping with acute sigmoid diverticulitis. There is an irregular right pelvic sidewall abscess with thick enhancing wall and internal gas extending inferiorly from the site of acute sigmoid diverticulitis, measuring 3.7 x 1.7 cm more superiorly (series 2/ image 59) and 2.9 x 2.8 cm more inferiorly (series 2/ image 63). Vascular/Lymphatic: Normal caliber abdominal aorta. Patent portal, splenic, hepatic and renal veins. No pathologically enlarged lymph nodes in the abdomen or pelvis. Reproductive: Grossly normal anteverted uterus. No adnexal mass (the right pelvic sidewall  abscess abuts the posterior/ inferior margin of the normal size right ovary). Other: Trace free fluid in the deep pelvis. Musculoskeletal: No aggressive appearing focal osseous lesions. IMPRESSION: 1. Perforated acute diverticulitis of the mid to distal sigmoid colon, with associated irregular small to moderate gas containing abscess in the right pelvic sidewall as described. 2. Mild reactive wall thickening in the posterior bladder. 3. Small hiatal hernia. Electronically Signed   By: Sheila [name] M.D.   On: 07/31/2015 16:35   Dg Chest Port 1 View  07/31/2015  CLINICAL DATA:  Fever, sepsis, abdominal abscess on CT EXAM: PORTABLE CHEST 1 VIEW COMPARISON:  None. FINDINGS: Lungs are clear.  No pleural effusion or pneumothorax. The heart is normal in size. IMPRESSION: No evidence of acute cardiopulmonary disease. Electronically Signed   By: Sheila [name] M.D.   On: 07/31/2015 18:55    ROS: Respiratory negative, cardiac negative, GI shows abdominal pain, nausea, no heartburn. Constitutional symptoms negative. Blood pressure 103/56, pulse 96, temperature 98.6 F (37 C), temperature source Oral, resp. rate 20, height _0  (1.651 m), weight 71.215 kg (157 lb), last menstrual period 09/28/2014, SpO2 97 %. Physical Exam: Pleasant white female in no acute distress. Abdomen is soft with no particular tenderness noted low in the abdomen. No hepatosplenomegaly, masses, or hernias identified. No rigidity is noted.  Assessment/Plan: Impression: Sigmoid diverticulitis with contained perforation. Patient is significantly improved since her admission yesterday. She does have a contained perforation, but I think there is no need for percutaneous drainage at this time given this improvement. No need for acute surgical intervention. May start clear liquid diet. Should she continue to improve, she may be discharged the next 24-48 hours. She has had a colonoscopy 3 years ago, thus no additional colonoscopy as needed. I  did discuss with her the possibility of needing an elective partial colectomy in the future should she continue to have recurrent episodes of diverticulitis. I would be more than happy to see her in my office in 2 weeks. She can also follow-up with her primary care physician as needed. Will follow peripherally with you.  Sheila [name] A 08/01/2015, 10:57 AM

## 2015-08-01 NOTE — Progress Notes (Signed)
PROGRESS NOTE    Sheila [name]  [id number] DOB: [birth year] DOA: 07/31/2015 PCP: [name], FNP  Outpatient Specialists:  GI: Dr. Oneida Alar  Brief Narrative:  79 yof with past medical history of essential HTN, anxiety, GERD, and diverticulitis with pelvic abscess presented with complaints of lower abdominal pain onset 2 weeks ago. She complained of generalized myalgia and bloating, for which she was seen by her PCP who diagnosed her with diverticulitis and prescribed Augmentin. She reports initially feeling better, but then started developing fevers with temps of 100, low back pain, 1-2 loose stools a day and generalized malaise. In the ED noted to have mild tachycardia and CT scan, the abdomen revealed perforated acute diverticulitis of the mid to distal sigmoid colon with abscess of the right pelvic sidewall. She was started on braod spectrum abx and admitted for general surgery consult.   Assessment & Plan:   Principal Problem:   Sepsis ([place]) Active Problems:   Diverticulitis of intestine with perforation and abscess without bleeding   Essential hypertension   GERD (gastroesophageal reflux disease)   1. Sepsis, secondary to sigmoid diverticulitis of intestine with perforation and abscess without bleeding. Improved. WBC trending down, 13.0. Lactic acid wnl, afebrile, and HR has now wnl. Continue IVF per sepsis protocol and abx. Follow up BC. General surgery following. Surgical intervention is not felt necessary at this time and neither is percutaneous drainage. Will advance her diet as tolerated. She will need a total of 2 weeks of abx.  2. Essential HTN, continue lisinopril.  3. GERD, continue PPI. 4. Generalized anxiety/insomnia. Lorazepam prn. 5. Hypokalemia, replaced.   DVT prophylaxis: SCDs Code Status: Full Family Communication: discussed with patient Disposition Plan: discharge home once improved   Consultants:   General Surgery    Procedures:  None  Antimicrobials:   Cipro 4/23>>  Flagyl 4/23>>   Subjective: Overall she is feeling better. Abdominal pain is improving. She is tolerating liquids. No vomiting. Had some loose stool today  Objective: Filed Vitals:   07/31/15 1903 07/31/15 2037 07/31/15 2216 08/01/15 0433  BP: 124/71  119/70 103/56  Pulse: 96 95 101 96  Temp: 98.8 F (37.1 C)  98.7 F (37.1 C) 98.6 F (37 C)  TempSrc: Oral  Oral Oral  Resp: 20 16 20 20   Height: 5\' 5"  (1.651 m)     Weight:      SpO2: 98% 98% 96% 97%    Intake/Output Summary (Last 24 hours) at 08/01/15 0739 Last data filed at 07/31/15 1552  Gross per 24 hour  Intake   1000 ml  Output      0 ml  Net   1000 ml   Filed Weights   07/31/15 1335  Weight: 71.215 kg (157 lb)    Examination:  General exam: Appears calm and comfortable  Respiratory system: Clear to auscultation. Respiratory effort normal. Cardiovascular system: S1 & S2 heard, RRR. No JVD, murmurs, rubs, gallops or clicks. No pedal edema. Gastrointestinal system: Abdomen is nondistended, soft with mild tenderness in the lower quadrants. No organomegaly or masses felt. Normal bowel sounds heard. Central nervous system: Alert and oriented. No focal neurological deficits. Extremities: Symmetric 5 x 5 power. Skin: No rashes, lesions or ulcers Psychiatry: Judgement and insight appear normal. Mood & affect appropriate.     Data Reviewed: I have personally reviewed following labs and imaging studies  CBC:  Recent Labs Lab 07/31/15 1354 08/01/15 0624  WBC 19.4* 13.0*  NEUTROABS 16.2*  --   HGB 13.7 10.7*  HCT 39.7 31.5*  MCV 87.6 88.0  PLT 436* [id number]   Basic Metabolic Panel:  Recent Labs Lab 07/31/15 1354 08/01/15 0624  NA 137 137  K 3.8 3.3*  CL 100* 105  CO2 24 21*  GLUCOSE 95 110*  BUN 11 10  CREATININE 0.65 0.60  CALCIUM 9.6 8.3*   GFR: Estimated Creatinine Clearance: 86 mL/min (by C-G formula based on Cr of 0.6). Liver Function  Tests:  Recent Labs Lab 07/31/15 1354  AST 20  ALT 30  ALKPHOS 89  BILITOT 0.9  PROT 9.1*  ALBUMIN 4.4    Recent Labs Lab 07/31/15 1354  LIPASE 37   No results for input(s): AMMONIA in the last 168 hours. Coagulation Profile:  Recent Labs Lab 07/31/15 1354  INR 1.08   Urine analysis:    Component Value Date/Time   COLORURINE YELLOW 07/31/2015 1350   APPEARANCEUR CLOUDY* 07/31/2015 1350   LABSPEC >1.030* 07/31/2015 1350   PHURINE 6.0 07/31/2015 1350   GLUCOSEU NEGATIVE 07/31/2015 1350   HGBUR NEGATIVE 07/31/2015 1350   BILIRUBINUR SMALL* 07/31/2015 1350   KETONESUR 15* 07/31/2015 1350   PROTEINUR 30* 07/31/2015 1350   UROBILINOGEN 0.2 02/18/2011 2131   NITRITE NEGATIVE 07/31/2015 1350   LEUKOCYTESUR NEGATIVE 07/31/2015 1350   Sepsis Labs: @LABRCNTIP (procalcitonin:4,lacticidven:4)  ) Recent Results (from the past 240 hour(s))  Blood culture (routine x 2)     Status: None (Preliminary result)   Collection Time: 07/31/15  3:12 PM  Result Value Ref Range Status   Specimen Description BLOOD LEFT ANTECUBITAL  Final   Special Requests BOTTLES DRAWN AEROBIC AND ANAEROBIC 10CC  Final   Culture PENDING  Incomplete   Report Status PENDING  Incomplete  Blood culture (routine x 2)     Status: None (Preliminary result)   Collection Time: 07/31/15  3:16 PM  Result Value Ref Range Status   Specimen Description BLOOD BLOOD LEFT HAND  Final   Special Requests BOTTLES DRAWN AEROBIC ONLY 2CC  Final   Culture PENDING  Incomplete   Report Status PENDING  Incomplete         Radiology Studies: Ct Abdomen Pelvis W Contrast  07/31/2015  CLINICAL DATA:  Left lower quadrant pain and leukocytosis. History of diverticulitis on antibiotics for 1 week. Cholecystectomy . EXAM: CT ABDOMEN AND PELVIS WITH CONTRAST TECHNIQUE: Multidetector CT imaging of the abdomen and pelvis was performed using the standard protocol following bolus administration of intravenous contrast. CONTRAST:   148mL ISOVUE-300 IOPAMIDOL (ISOVUE-300) INJECTION 61% COMPARISON:  02/23/2011 CT abdomen/ pelvis. FINDINGS: Lower chest: No significant pulmonary nodules or acute consolidative airspace disease. Hepatobiliary: Normal liver with no liver mass. Cholecystectomy. Bile ducts are within expected post cholecystectomy limits with mild central intrahepatic biliary ductal dilatation and common bile duct diameter 7 mm. No radiopaque choledocholithiasis. Pancreas: Normal, with no mass or duct dilation. Spleen: Normal size. No mass. Adrenals/Urinary Tract: Normal adrenals. Subcentimeter hypodense renal cortical lesion in the anterior upper left kidney is too small to characterize and not definitely changed since 2012, in keeping with a benign renal cyst. No new renal masses. No hydronephrosis. Mild reactive wall thickening in the posterior bladder. Stomach/Bowel: Small hiatal hernia. Otherwise grossly normal stomach. Normal caliber small bowel with no small bowel wall thickening. Normal appendix. Mobile cecum in the anterior right abdomen near the midline. There is severe wall thickening and hyperenhancement in the mid to distal sigmoid colon with associated prominent pericolonic fat stranding with underlying mild sigmoid diverticulosis, in keeping with acute sigmoid diverticulitis.  There is an irregular right pelvic sidewall abscess with thick enhancing wall and internal gas extending inferiorly from the site of acute sigmoid diverticulitis, measuring 3.7 x 1.7 cm more superiorly (series 2/ image 59) and 2.9 x 2.8 cm more inferiorly (series 2/ image 63). Vascular/Lymphatic: Normal caliber abdominal aorta. Patent portal, splenic, hepatic and renal veins. No pathologically enlarged lymph nodes in the abdomen or pelvis. Reproductive: Grossly normal anteverted uterus. No adnexal mass (the right pelvic sidewall abscess abuts the posterior/ inferior margin of the normal size right ovary). Other: Trace free fluid in the deep pelvis.  Musculoskeletal: No aggressive appearing focal osseous lesions. IMPRESSION: 1. Perforated acute diverticulitis of the mid to distal sigmoid colon, with associated irregular small to moderate gas containing abscess in the right pelvic sidewall as described. 2. Mild reactive wall thickening in the posterior bladder. 3. Small hiatal hernia. Electronically Signed   By: Ilona Sorrel M.D.   On: 07/31/2015 16:35   Dg Chest Port 1 View  07/31/2015  CLINICAL DATA:  Fever, sepsis, abdominal abscess on CT EXAM: PORTABLE CHEST 1 VIEW COMPARISON:  None. FINDINGS: Lungs are clear.  No pleural effusion or pneumothorax. The heart is normal in size. IMPRESSION: No evidence of acute cardiopulmonary disease. Electronically Signed   By: Julian Hy M.D.   On: 07/31/2015 18:55        Scheduled Meds: . acidophilus  1 capsule Oral Daily  . ciprofloxacin  400 mg Intravenous Q12H  . lisinopril  5 mg Oral Daily  . metronidazole  500 mg Intravenous Q8H   Continuous Infusions:    LOS: 1 day    Time spent: 14mins    .Kathie Dike, MD  [facility] Pager [phone]  If 7PM-7AM, please contact night-coverage [email or web address] Password [facility] 08/01/2015, 7:39 AM

## 2015-08-01 NOTE — Discharge Instructions (Signed)
Diverticulitis °Diverticulitis is inflammation or infection of small pouches in your colon that form when you have a condition called diverticulosis. The pouches in your colon are called diverticula. Your colon, or large intestine, is where water is absorbed and stool is formed. °Complications of diverticulitis can include: °· Bleeding. °· Severe infection. °· Severe pain. °· Perforation of your colon. °· Obstruction of your colon. °CAUSES  °Diverticulitis is caused by bacteria. °Diverticulitis happens when stool becomes trapped in diverticula. This allows bacteria to grow in the diverticula, which can lead to inflammation and infection. °RISK FACTORS °People with diverticulosis are at risk for diverticulitis. Eating a diet that does not include enough fiber from fruits and vegetables may make diverticulitis more likely to develop. °SYMPTOMS  °Symptoms of diverticulitis may include: °· Abdominal pain and tenderness. The pain is normally located on the left side of the abdomen, but may occur in other areas. °· Fever and chills. °· Bloating. °· Cramping. °· Nausea. °· Vomiting. °· Constipation. °· Diarrhea. °· Blood in your stool. °DIAGNOSIS  °Your health care provider will ask you about your medical history and do a physical exam. You may need to have tests done because many medical conditions can cause the same symptoms as diverticulitis. Tests may include: °· Blood tests. °· Urine tests. °· Imaging tests of the abdomen, including X-rays and CT scans. °When your condition is under control, your health care provider may recommend that you have a colonoscopy. A colonoscopy can show how severe your diverticula are and whether something else is causing your symptoms. °TREATMENT  °Most cases of diverticulitis are mild and can be treated at home. Treatment may include: °· Taking over-the-counter pain medicines. °· Following a clear liquid diet. °· Taking antibiotic medicines by mouth for 7-10 days. °More severe cases may  be treated at a hospital. Treatment may include: °· Not eating or drinking. °· Taking prescription pain medicine. °· Receiving antibiotic medicines through an IV tube. °· Receiving fluids and nutrition through an IV tube. °· Surgery. °HOME CARE INSTRUCTIONS  °· Follow your health care provider's instructions carefully. °· Follow a full liquid diet or other diet as directed by your health care provider. After your symptoms improve, your health care provider may tell you to change your diet. He or she may recommend you eat a high-fiber diet. Fruits and vegetables are good sources of fiber. Fiber makes it easier to pass stool. °· Take fiber supplements or probiotics as directed by your health care provider. °· Only take medicines as directed by your health care provider. °· Keep all your follow-up appointments. °SEEK MEDICAL CARE IF:  °· Your pain does not improve. °· You have a hard time eating food. °· Your bowel movements do not return to normal. °SEEK IMMEDIATE MEDICAL CARE IF:  °· Your pain becomes worse. °· Your symptoms do not get better. °· Your symptoms suddenly get worse. °· You have a fever. °· You have repeated vomiting. °· You have bloody or black, tarry stools. °MAKE SURE YOU:  °· Understand these instructions. °· Will watch your condition. °· Will get help right away if you are not doing well or get worse. °  °This information is not intended to replace advice given to you by your health care provider. Make sure you discuss any questions you have with your health care provider. °  °Document Released: 01/04/2005 Document Revised: 04/01/2013 Document Reviewed: 02/19/2013 °Elsevier Interactive Patient Education ©2016 [employer] ° °

## 2015-08-02 LAB — CBC WITH DIFFERENTIAL/PLATELET
BASOS ABS: 0 10*3/uL (ref 0.0–0.1)
Basophils Relative: 0 %
Eosinophils Absolute: 0.1 10*3/uL (ref 0.0–0.7)
Eosinophils Relative: 1 %
HEMATOCRIT: 32.7 % — AB (ref 36.0–46.0)
Hemoglobin: 11 g/dL — ABNORMAL LOW (ref 12.0–15.0)
LYMPHS PCT: 12 %
Lymphs Abs: 1.3 10*3/uL (ref 0.7–4.0)
MCH: 29.6 pg (ref 26.0–34.0)
MCHC: 33.6 g/dL (ref 30.0–36.0)
MCV: 87.9 fL (ref 78.0–100.0)
Monocytes Absolute: 0.9 10*3/uL (ref 0.1–1.0)
Monocytes Relative: 8 %
NEUTROS ABS: 8.7 10*3/uL — AB (ref 1.7–7.7)
NEUTROS PCT: 79 %
Platelets: 376 10*3/uL (ref 150–400)
RBC: 3.72 MIL/uL — AB (ref 3.87–5.11)
RDW: 12.2 % (ref 11.5–15.5)
WBC: 11.1 10*3/uL — AB (ref 4.0–10.5)

## 2015-08-02 LAB — BASIC METABOLIC PANEL
ANION GAP: 11 (ref 5–15)
BUN: 5 mg/dL — ABNORMAL LOW (ref 6–20)
CO2: 23 mmol/L (ref 22–32)
Calcium: 8.7 mg/dL — ABNORMAL LOW (ref 8.9–10.3)
Chloride: 103 mmol/L (ref 101–111)
Creatinine, Ser: 0.59 mg/dL (ref 0.44–1.00)
GFR calc Af Amer: >60 mL/min (ref >60)
Glucose, Bld: 118 mg/dL — ABNORMAL HIGH (ref 65–99)
POTASSIUM: 4.4 mmol/L (ref 3.5–5.1)
SODIUM: 137 mmol/L (ref 135–145)

## 2015-08-02 MED ORDER — HYDROCODONE-ACETAMINOPHEN 5-325 MG PO TABS
1.0000 | ORAL_TABLET | Freq: Four times a day (QID) | ORAL | Status: DC | PRN
  Administered 2015-08-02: 2 via ORAL
  Administered 2015-08-02: 1 via ORAL
  Administered 2015-08-03: 2 via ORAL
  Administered 2015-08-03: 1 via ORAL
  Filled 2015-08-02: qty 2
  Filled 2015-08-02: qty 1
  Filled 2015-08-02 (×2): qty 2

## 2015-08-02 NOTE — Progress Notes (Signed)
PROGRESS NOTE    Sheila [name]  [id number] DOB: 03-Mar-1966 DOA: 07/31/2015 PCP: [name], FNP  Outpatient Specialists:  GI: Dr. Oneida Alar  Brief Narrative:  53 yof with past medical history of essential HTN, anxiety, GERD, and diverticulitis with pelvic abscess presented with complaints of lower abdominal pain onset 2 weeks ago. She complained of generalized myalgia and bloating, for which she was seen by her PCP who diagnosed her with diverticulitis and prescribed Augmentin. She reports initially feeling better, but then started developing fevers with temps of 100, low back pain, 1-2 loose stools a day and generalized malaise. In the ED noted to have mild tachycardia and CT scan, the abdomen revealed perforated acute diverticulitis of the mid to distal sigmoid colon with abscess of the right pelvic sidewall. She was started on braod spectrum abx and admitted for general surgery consult.   Assessment & Plan: Principal Problem:   Sepsis ([place]) Active Problems:   Diverticulitis of intestine with perforation and abscess without bleeding   Essential hypertension   GERD (gastroesophageal reflux disease)  1. Sepsis, secondary to sigmoid diverticulitis of intestine with perforation and abscess without bleeding. Improving. WBC continues to trend down. Lactic acid wnl, afebrile, and HR has now wnl. Continue IVF per sepsis protocol and abx. She will need a total of 2 weeks of abx. BC with out growth todate. General surgery consulted and did not believe surgical intervention necessary at this time and neither is percutaneous drainage. She has been able to tolerate a solid diet without issues. Patient complains of worsening abdominal pain today. Will continue another 24 hours of IV abx. If she is feeling better tomorrow consider discharge.  2. Essential HTN, continue lisinopril.  3. GERD, continue PPI. 4. Generalized anxiety/insomnia. Lorazepam prn. 5. Hypokalemia, replaced.   DVT  prophylaxis: SCDs Code Status: Full Family Communication: No family bedside.  Disposition Plan: Anticipate discharge in 24 hours.    Consultants:   General Surgery   Procedures:  None  Antimicrobials:   Cipro 4/23>>  Flagyl 4/23>>   Subjective: Still has pain, especially during bowel movement and before eating solid food. Was able to eat without difficulty. Loose bowel movements. Tylenol helps relieve pain.   Objective: Filed Vitals:   08/01/15 0433 08/01/15 2200 08/02/15 0510 08/02/15 0634  BP: 103/56 116/68 101/57 159/90  Pulse: 96 87 81 51  Temp: 98.6 F (37 C) 99.1 F (37.3 C) 98.1 F (36.7 C) 98.5 F (36.9 C)  TempSrc: Oral Oral Oral Oral  Resp: 20 20 20 20   Height:      Weight:      SpO2: 97% 99% 96% 100%    Intake/Output Summary (Last 24 hours) at 08/02/15 0743 Last data filed at 08/02/15 0634  Gross per 24 hour  Intake    240 ml  Output   1600 ml  Net  -1360 ml   Filed Weights   07/31/15 1335  Weight: 71.215 kg (157 lb)    Examination:  General exam: NAD. Does not appear ill or toxic.  Respiratory system: Clear to auscultation. Respiratory effort normal. Cardiovascular system: RRR. No JVD, murmurs, rubs, gallops or clicks. No pedal edema. Gastrointestinal system: Abdomen is nondistended, soft with mild tenderness in the lower quadrants. No organomegaly or masses felt. Normal bowel sounds heard. Central nervous system: Alert and oriented. No focal neurological deficits. Extremities: Symmetric 5 x 5 power. Psychiatry: Judgement and insight appear normal. Mood & affect appropriate.     Data Reviewed: I have personally reviewed following  labs and imaging studies  CBC:  Recent Labs Lab 07/31/15 1354 08/01/15 0624 08/02/15 0609  WBC 19.4* 13.0* 11.1*  NEUTROABS 16.2*  --  8.7*  HGB 13.7 10.7* 11.0*  HCT 39.7 31.5* 32.7*  MCV 87.6 88.0 87.9  PLT 436* 331 [id number]   Basic Metabolic Panel:  Recent Labs Lab 07/31/15 1354 08/01/15 0624  08/02/15 0609  NA 137 137 137  K 3.8 3.3* 4.4  CL 100* 105 103  CO2 24 21* 23  GLUCOSE 95 110* 118*  BUN 11 10 5*  CREATININE 0.65 0.60 0.59  CALCIUM 9.6 8.3* 8.7*   GFR: Estimated Creatinine Clearance: 86 mL/min (by C-G formula based on Cr of 0.59). Liver Function Tests:  Recent Labs Lab 07/31/15 1354  AST 20  ALT 30  ALKPHOS 89  BILITOT 0.9  PROT 9.1*  ALBUMIN 4.4    Recent Labs Lab 07/31/15 1354  LIPASE 37   No results for input(s): AMMONIA in the last 168 hours. Coagulation Profile:  Recent Labs Lab 07/31/15 1354  INR 1.08   Urine analysis:    Component Value Date/Time   COLORURINE YELLOW 07/31/2015 1350   APPEARANCEUR CLOUDY* 07/31/2015 1350   LABSPEC >1.030* 07/31/2015 1350   PHURINE 6.0 07/31/2015 1350   GLUCOSEU NEGATIVE 07/31/2015 1350   HGBUR NEGATIVE 07/31/2015 1350   BILIRUBINUR SMALL* 07/31/2015 1350   KETONESUR 15* 07/31/2015 1350   PROTEINUR 30* 07/31/2015 1350   UROBILINOGEN 0.2 02/18/2011 2131   NITRITE NEGATIVE 07/31/2015 1350   LEUKOCYTESUR NEGATIVE 07/31/2015 1350   Sepsis Labs: @LABRCNTIP (procalcitonin:4,lacticidven:4)  ) Recent Results (from the past 240 hour(s))  Blood culture (routine x 2)     Status: None (Preliminary result)   Collection Time: 07/31/15  3:12 PM  Result Value Ref Range Status   Specimen Description BLOOD LEFT ANTECUBITAL  Final   Special Requests BOTTLES DRAWN AEROBIC AND ANAEROBIC 10CC  Final   Culture PENDING  Incomplete   Report Status PENDING  Incomplete  Blood culture (routine x 2)     Status: None (Preliminary result)   Collection Time: 07/31/15  3:16 PM  Result Value Ref Range Status   Specimen Description BLOOD BLOOD LEFT HAND  Final   Special Requests BOTTLES DRAWN AEROBIC ONLY 2CC  Final   Culture PENDING  Incomplete   Report Status PENDING  Incomplete         Radiology Studies: Ct Abdomen Pelvis W Contrast  07/31/2015  CLINICAL DATA:  Left lower quadrant pain and leukocytosis.  History of diverticulitis on antibiotics for 1 week. Cholecystectomy . EXAM: CT ABDOMEN AND PELVIS WITH CONTRAST TECHNIQUE: Multidetector CT imaging of the abdomen and pelvis was performed using the standard protocol following bolus administration of intravenous contrast. CONTRAST:  165mL ISOVUE-300 IOPAMIDOL (ISOVUE-300) INJECTION 61% COMPARISON:  02/23/2011 CT abdomen/ pelvis. FINDINGS: Lower chest: No significant pulmonary nodules or acute consolidative airspace disease. Hepatobiliary: Normal liver with no liver mass. Cholecystectomy. Bile ducts are within expected post cholecystectomy limits with mild central intrahepatic biliary ductal dilatation and common bile duct diameter 7 mm. No radiopaque choledocholithiasis. Pancreas: Normal, with no mass or duct dilation. Spleen: Normal size. No mass. Adrenals/Urinary Tract: Normal adrenals. Subcentimeter hypodense renal cortical lesion in the anterior upper left kidney is too small to characterize and not definitely changed since 2012, in keeping with a benign renal cyst. No new renal masses. No hydronephrosis. Mild reactive wall thickening in the posterior bladder. Stomach/Bowel: Small hiatal hernia. Otherwise grossly normal stomach. Normal caliber small bowel with no  small bowel wall thickening. Normal appendix. Mobile cecum in the anterior right abdomen near the midline. There is severe wall thickening and hyperenhancement in the mid to distal sigmoid colon with associated prominent pericolonic fat stranding with underlying mild sigmoid diverticulosis, in keeping with acute sigmoid diverticulitis. There is an irregular right pelvic sidewall abscess with thick enhancing wall and internal gas extending inferiorly from the site of acute sigmoid diverticulitis, measuring 3.7 x 1.7 cm more superiorly (series 2/ image 59) and 2.9 x 2.8 cm more inferiorly (series 2/ image 63). Vascular/Lymphatic: Normal caliber abdominal aorta. Patent portal, splenic, hepatic and renal  veins. No pathologically enlarged lymph nodes in the abdomen or pelvis. Reproductive: Grossly normal anteverted uterus. No adnexal mass (the right pelvic sidewall abscess abuts the posterior/ inferior margin of the normal size right ovary). Other: Trace free fluid in the deep pelvis. Musculoskeletal: No aggressive appearing focal osseous lesions. IMPRESSION: 1. Perforated acute diverticulitis of the mid to distal sigmoid colon, with associated irregular small to moderate gas containing abscess in the right pelvic sidewall as described. 2. Mild reactive wall thickening in the posterior bladder. 3. Small hiatal hernia. Electronically Signed   By: Ilona Sorrel M.D.   On: 07/31/2015 16:35   Dg Chest Port 1 View  07/31/2015  CLINICAL DATA:  Fever, sepsis, abdominal abscess on CT EXAM: PORTABLE CHEST 1 VIEW COMPARISON:  None. FINDINGS: Lungs are clear.  No pleural effusion or pneumothorax. The heart is normal in size. IMPRESSION: No evidence of acute cardiopulmonary disease. Electronically Signed   By: Julian Hy M.D.   On: 07/31/2015 18:55        Scheduled Meds: . acidophilus  1 capsule Oral Daily  . ciprofloxacin  400 mg Intravenous Q12H  . fluconazole  100 mg Oral Daily  . lisinopril  5 mg Oral Daily  . metronidazole  500 mg Intravenous Q8H  . pantoprazole  40 mg Oral Daily   Continuous Infusions: . sodium chloride 75 mL/hr at 08/02/15 0130     LOS: 2 days    Time spent: 95mins  Kathie Dike, MD  [facility] Pager [phone]  If 7PM-7AM, please contact night-coverage [email or web address] Password [facility] 08/02/2015, 7:43 AM    By signing my name below, I, Rennis Harding, attest that this documentation has been prepared under the direction and in the presence of Kathie Dike, MD. Electronically signed: Rennis Harding, Scribe. 08/02/2015 2:09pm   I, Dr. Kathie Dike, personally performed the services described in this documentaiton. All medical record entries made  by the scribe were at my direction and in my presence. I have reviewed the chart and agree that the record reflects my personal performance and is accurate and complete  Kathie Dike, MD, 08/02/2015 2:39 PM

## 2015-08-03 LAB — CBC
HCT: 34.4 % — ABNORMAL LOW (ref 36.0–46.0)
HEMOGLOBIN: 11.5 g/dL — AB (ref 12.0–15.0)
MCH: 29.4 pg (ref 26.0–34.0)
MCHC: 33.4 g/dL (ref 30.0–36.0)
MCV: 88 fL (ref 78.0–100.0)
PLATELETS: 369 10*3/uL (ref 150–400)
RBC: 3.91 MIL/uL (ref 3.87–5.11)
RDW: 12.5 % (ref 11.5–15.5)
WBC: 10.3 10*3/uL (ref 4.0–10.5)

## 2015-08-03 MED ORDER — HYDROCODONE-ACETAMINOPHEN 5-325 MG PO TABS: 1.0000 | ORAL_TABLET | Freq: Four times a day (QID) | ORAL | Status: DC | PRN

## 2015-08-03 MED ORDER — FLUCONAZOLE 100 MG PO TABS: 100.0000 mg | ORAL_TABLET | Freq: Every day | ORAL | Status: DC

## 2015-08-03 MED ORDER — METRONIDAZOLE 500 MG PO TABS: 500.0000 mg | ORAL_TABLET | Freq: Three times a day (TID) | ORAL | Status: DC

## 2015-08-03 MED ORDER — CIPROFLOXACIN HCL 500 MG PO TABS: 500.0000 mg | ORAL_TABLET | Freq: Two times a day (BID) | ORAL | Status: DC

## 2015-08-03 MED ORDER — POLYETHYLENE GLYCOL 3350 17 G PO PACK: 17.0000 g | PACK | Freq: Every day | ORAL | Status: DC | PRN

## 2015-08-03 NOTE — Progress Notes (Signed)
Patient discharged with Instructions given on medications,and follow up visits,patient verbalized understanding. Prescriptions sent with patient. Accompanied by staff to an waiting vehicle.

## 2015-08-03 NOTE — Progress Notes (Signed)
  Subjective: Patient's pain started returning yesterday with advancing diet, but is well controlled with minimal norco. His having bowel movements and passing flatus. Tolerating regular diet well.  Objective: Vital signs in last 24 hours: Temp:  [98.4 F (36.9 C)-99 F (37.2 C)] 98.4 F (36.9 C) (04/25 0534) Pulse Rate:  [87-104] 92 (04/25 0746) Resp:  [20] 20 (04/25 0534) BP: (109-122)/(48-75) 109/48 mmHg (04/25 0746) SpO2:  [98 %-100 %] 98 % (04/25 0534) Last BM Date: 08/02/15  Intake/Output from previous day: 04/24 0701 - 04/25 0700 In: 600 [P.O.:600] Out: 3200 [Urine:3200] Intake/Output this shift:    General appearance: alert, cooperative and no distress GI: Soft, minimal tenderness noted in the lower abdomen. No rigidity. Active bowel sounds appreciated.  Lab Results:   Recent Labs  08/02/15 0609 08/03/15 0501  WBC 11.1* 10.3  HGB 11.0* 11.5*  HCT 32.7* 34.4*  PLT 376 369   BMET  Recent Labs  08/01/15 0624 08/02/15 0609  NA 137 137  K 3.3* 4.4  CL 105 103  CO2 21* 23  GLUCOSE 110* 118*  BUN 10 5*  CREATININE 0.60 0.59  CALCIUM 8.3* 8.7*   PT/INR  Recent Labs  07/31/15 1354  LABPROT 14.2  INR 1.08    Studies/Results: No results found.  Anti-infectives: Anti-infectives    Start     Dose/Rate Route Frequency Ordered Stop   08/01/15 1900  fluconazole (DIFLUCAN) tablet 100 mg     100 mg Oral Daily 08/01/15 1848     08/01/15 0600  ciprofloxacin (CIPRO) IVPB 400 mg     400 mg 200 mL/hr over 60 Minutes Intravenous Every 12 hours 07/31/15 1940     08/01/15 0200  metroNIDAZOLE (FLAGYL) IVPB 500 mg     500 mg 100 mL/hr over 60 Minutes Intravenous Every 8 hours 07/31/15 1940     07/31/15 1700  ciprofloxacin (CIPRO) IVPB 400 mg     400 mg 200 mL/hr over 60 Minutes Intravenous  Once 07/31/15 1645 07/31/15 1755   07/31/15 1700  metroNIDAZOLE (FLAGYL) IVPB 500 mg     500 mg 100 mL/hr over 60 Minutes Intravenous  Once 07/31/15 1645 07/31/15 1755       Assessment/Plan: Impression: Sigmoid diverticulitis with contained abscess, much improved. Plan: Okay for discharge from surgery standpoint. I will see her my office in 2 weeks. Would continue ciprofloxacin and Flagyl for 10 days.  LOS: 3 days    Ramandeep Arington A 08/03/2015

## 2015-08-03 NOTE — Discharge Summary (Signed)
Physician Discharge Summary  Sheila [name] [id number] DOB: December 18, 1965 DOA: 07/31/2015  PCP: [name], FNP  Admit date: 07/31/2015 Discharge date: 08/03/2015  Time spent: 35 minutes  Recommendations for Outpatient Follow-up:  1. Follow up with PCP in 1-2 weeks 2. Discharge with a course of antibiotics. 3. Follow up with [name] in 2 weeks   Discharge Diagnoses:  Principal Problem:   Sepsis [facility]) Active Problems:   Diverticulitis of intestine with perforation and abscess without bleeding   Essential hypertension   GERD (gastroesophageal reflux disease)   Discharge Condition: Improved  Diet recommendation: Regular  Filed Weights   07/31/15 1335  Weight: 71.215 kg (157 lb)    History of present illness:  71 yof with a hx of HTN, GERD, and diverticulitis with pelvic abscess presented with complaints of lower abd pain for 2 weeks prior to admission. She was evaluated by her PCP on 4/13 and questioned to have diverticulitis. She was given Augmentin and discharged home without any imaging studies. She thought she was getting better, but was experiencing low grade fevers throughout the week. She also complained of low back pain, loose stools, and generalized malaise. She returned to urgent care and was found to have an elevated WBC. While in the ED, she was found to have mild tachycardia. CT scan of the abd showed perforated diverticulitis of the mild to distal sigmoid colon with abscess of the right pelvic sidewall. She was started on abx and general surgery was consulted.  Hospital Course:  While in the ED, a CT abd revealed perforated acute diverticulitis of the mid to distal sigmoid colon. She was found to have sepsis, secondary to sigmoid diverticulitis with perforation and abscess and was subsequently started on Ciprofloxacin and Flagyl and blood cultures were ordered and treated per sepsis protocol. Her blood cx showed no growth. [name] of General Surgery was  consulted and saw the patient on 4/23 who saw no need for surgical intervention. It was also felt that percutaneous drainage would not be necessary at this time. She was continued on IV abx and her diet was advanced. She continued to improved clinically and her WBC returned to normal limits. She is now tolerating a solid diet without any difficultties and her pain is controlled. She ha not had any fevers. She will discharged with a course of antibiotics. It is recommended that she follow up with [name] in 2 weeks.  Procedures:  none  Consultations:  General  Discharge Exam: Filed Vitals:   08/03/15 0534 08/03/15 0746  BP: 120/75 109/48  Pulse: 87 92  Temp: 98.4 F (36.9 C)   Resp: 20     Examination:  General exam: Appears calm and comfortable  Respiratory system: Clear to auscultation. Respiratory effort normal. Cardiovascular system: S1 & S2 heard, RRR. No JVD, murmurs, rubs, gallops or clicks. No pedal edema. Gastrointestinal system: Abdomen is nondistended, soft and nontender. No organomegaly or masses felt. Normal bowel sounds heard. Central nervous system: Alert and oriented. No focal neurological deficits. Extremities: Symmetric 5 x 5 power. Skin: No rashes, lesions or ulcers Psychiatry: Judgement and insight appear normal. Mood & affect appropriate.   Discharge Instructions    Current Discharge Medication List    CONTINUE these medications which have NOT CHANGED   Details  acetaminophen (TYLENOL) 500 MG tablet Take 1,000 mg by mouth every 6 (six) hours as needed for mild pain.    amoxicillin-clavulanate (AUGMENTIN) 875-125 MG tablet Take 1 tablet by mouth 2 (two) times daily.  Starting 07/22/15 x 10 days. Refills: 0    ibuprofen (ADVIL,MOTRIN) 800 MG tablet Take 800 mg by mouth every 6 (six) hours as needed. For pain     lisinopril (PRINIVIL,ZESTRIL) 5 MG tablet Take 5 mg by mouth daily.    LORazepam (ATIVAN) 0.5 MG tablet TAKE 1/2 TO 1 TABLET BY MOUTH AS  NEEDED FOR SLEEP AND ANXIETY Refills: 5    omeprazole (PRILOSEC OTC) 20 MG tablet Take 20 mg by mouth daily.    Probiotic Product (PROBIOTIC DAILY PO) Take 1 capsule by mouth daily.    rizatriptan (MAXALT) 10 MG tablet Take 10 mg by mouth as needed for migraine. May repeat in 2 hours if needed    azithromycin (ZITHROMAX Z-PAK) 250 MG tablet As directed Qty: 6 each, Refills: 0       Allergies  Allergen Reactions  . Lactose Intolerance (Gi)   . Sulfa Antibiotics Rash   Follow-up Information    Follow up with Jamesetta So, MD. Schedule an appointment as soon as possible for a visit on 08/17/2015.   Specialty:  General Surgery   Contact information:   [address] [place] [phone]        The results of significant diagnostics from this hospitalization (including imaging, microbiology, ancillary and laboratory) are listed below for reference.    Significant Diagnostic Studies: Ct Abdomen Pelvis W Contrast  07/31/2015  CLINICAL DATA:  Left lower quadrant pain and leukocytosis. History of diverticulitis on antibiotics for 1 week. Cholecystectomy . EXAM: CT ABDOMEN AND PELVIS WITH CONTRAST TECHNIQUE: Multidetector CT imaging of the abdomen and pelvis was performed using the standard protocol following bolus administration of intravenous contrast. CONTRAST:  175mL ISOVUE-300 IOPAMIDOL (ISOVUE-300) INJECTION 61% COMPARISON:  02/23/2011 CT abdomen/ pelvis. FINDINGS: Lower chest: No significant pulmonary nodules or acute consolidative airspace disease. Hepatobiliary: Normal liver with no liver mass. Cholecystectomy. Bile ducts are within expected post cholecystectomy limits with mild central intrahepatic biliary ductal dilatation and common bile duct diameter 7 mm. No radiopaque choledocholithiasis. Pancreas: Normal, with no mass or duct dilation. Spleen: Normal size. No mass. Adrenals/Urinary Tract: Normal adrenals. Subcentimeter hypodense renal cortical lesion in  the anterior upper left kidney is too small to characterize and not definitely changed since 2012, in keeping with a benign renal cyst. No new renal masses. No hydronephrosis. Mild reactive wall thickening in the posterior bladder. Stomach/Bowel: Small hiatal hernia. Otherwise grossly normal stomach. Normal caliber small bowel with no small bowel wall thickening. Normal appendix. Mobile cecum in the anterior right abdomen near the midline. There is severe wall thickening and hyperenhancement in the mid to distal sigmoid colon with associated prominent pericolonic fat stranding with underlying mild sigmoid diverticulosis, in keeping with acute sigmoid diverticulitis. There is an irregular right pelvic sidewall abscess with thick enhancing wall and internal gas extending inferiorly from the site of acute sigmoid diverticulitis, measuring 3.7 x 1.7 cm more superiorly (series 2/ image 59) and 2.9 x 2.8 cm more inferiorly (series 2/ image 63). Vascular/Lymphatic: Normal caliber abdominal aorta. Patent portal, splenic, hepatic and renal veins. No pathologically enlarged lymph nodes in the abdomen or pelvis. Reproductive: Grossly normal anteverted uterus. No adnexal mass (the right pelvic sidewall abscess abuts the posterior/ inferior margin of the normal size right ovary). Other: Trace free fluid in the deep pelvis. Musculoskeletal: No aggressive appearing focal osseous lesions. IMPRESSION: 1. Perforated acute diverticulitis of the mid to distal sigmoid colon, with associated irregular small to moderate gas containing abscess in the right  pelvic sidewall as described. 2. Mild reactive wall thickening in the posterior bladder. 3. Small hiatal hernia. Electronically Signed   By: Ilona Sorrel M.D.   On: 07/31/2015 16:35   Dg Chest Port 1 View  07/31/2015  CLINICAL DATA:  Fever, sepsis, abdominal abscess on CT EXAM: PORTABLE CHEST 1 VIEW COMPARISON:  None. FINDINGS: Lungs are clear.  No pleural effusion or pneumothorax.  The heart is normal in size. IMPRESSION: No evidence of acute cardiopulmonary disease. Electronically Signed   By: Julian Hy M.D.   On: 07/31/2015 18:55    Microbiology: Recent Results (from the past 240 hour(s))  Blood culture (routine x 2)     Status: None (Preliminary result)   Collection Time: 07/31/15  3:12 PM  Result Value Ref Range Status   Specimen Description BLOOD LEFT ANTECUBITAL  Final   Special Requests BOTTLES DRAWN AEROBIC AND ANAEROBIC 10CC  Final   Culture NO GROWTH 2 DAYS  Final   Report Status PENDING  Incomplete  Blood culture (routine x 2)     Status: None (Preliminary result)   Collection Time: 07/31/15  3:16 PM  Result Value Ref Range Status   Specimen Description BLOOD BLOOD LEFT HAND  Final   Special Requests BOTTLES DRAWN AEROBIC ONLY 2CC  Final   Culture NO GROWTH 2 DAYS  Final   Report Status PENDING  Incomplete     Labs: Basic Metabolic Panel:  Recent Labs Lab 07/31/15 1354 08/01/15 0624 08/02/15 0609  NA 137 137 137  K 3.8 3.3* 4.4  CL 100* 105 103  CO2 24 21* 23  GLUCOSE 95 110* 118*  BUN 11 10 5*  CREATININE 0.65 0.60 0.59  CALCIUM 9.6 8.3* 8.7*   Liver Function Tests:  Recent Labs Lab 07/31/15 1354  AST 20  ALT 30  ALKPHOS 89  BILITOT 0.9  PROT 9.1*  ALBUMIN 4.4    Recent Labs Lab 07/31/15 1354  LIPASE 37   No results for input(s): AMMONIA in the last 168 hours. CBC:  Recent Labs Lab 07/31/15 1354 08/01/15 0624 08/02/15 0609 08/03/15 0501  WBC 19.4* 13.0* 11.1* 10.3  NEUTROABS 16.2*  --  8.7*  --   HGB 13.7 10.7* 11.0* 11.5*  HCT 39.7 31.5* 32.7* 34.4*  MCV 87.6 88.0 87.9 88.0  PLT 436* 331 376 369   Cardiac Enzymes: No results for input(s): CKTOTAL, CKMB, CKMBINDEX, TROPONINI in the last 168 hours. BNP: BNP (last 3 results) No results for input(s): BNP in the last 8760 hours.  ProBNP (last 3 results) No results for input(s): PROBNP in the last 8760 hours.  CBG: No results for input(s): GLUCAP  in the last 168 hours.     Signed:  Kathie Dike, MD.  [facility] 08/03/2015, 9:18 AM  By signing my name below, I, Delene Ruffini, attest that this documentation has been prepared under the direction and in the presence of Kathie Dike, MD. Electronically Signed: Delene Ruffini 08/03/2015 11:00am  I, Dr. Kathie Dike, personally performed the services described in this documentaiton. All medical record entries made by the scribe were at my direction and in my presence. I have reviewed the chart and agree that the record reflects my personal performance and is accurate and complete  Kathie Dike, MD, 08/03/2015 11:14 AM

## 2015-08-05 LAB — CULTURE, BLOOD (ROUTINE X 2)
CULTURE: NO GROWTH
CULTURE: NO GROWTH

## 2015-08-09 DIAGNOSIS — K5720 Diverticulitis of large intestine with perforation and abscess without bleeding: Secondary | ICD-10-CM | POA: Diagnosis not present

## 2015-08-09 DIAGNOSIS — N3001 Acute cystitis with hematuria: Secondary | ICD-10-CM | POA: Diagnosis not present

## 2015-08-09 DIAGNOSIS — R300 Dysuria: Secondary | ICD-10-CM | POA: Diagnosis not present

## 2015-08-12 ENCOUNTER — Other Ambulatory Visit (HOSPITAL_COMMUNITY): Payer: Self-pay | Admitting: General Surgery

## 2015-08-12 DIAGNOSIS — K5720 Diverticulitis of large intestine with perforation and abscess without bleeding: Secondary | ICD-10-CM

## 2015-08-12 DIAGNOSIS — N321 Vesicointestinal fistula: Secondary | ICD-10-CM

## 2015-08-19 ENCOUNTER — Ambulatory Visit (HOSPITAL_COMMUNITY)
Admission: RE | Admit: 2015-08-19 | Discharge: 2015-08-19 | Disposition: A | Discharge status: Home / Self Care | Payer: BLUE CROSS/BLUE SHIELD | Source: Ambulatory Visit | Attending: General Surgery | Admitting: General Surgery

## 2015-08-19 DIAGNOSIS — K5720 Diverticulitis of large intestine with perforation and abscess without bleeding: Secondary | ICD-10-CM

## 2015-08-19 DIAGNOSIS — K5732 Diverticulitis of large intestine without perforation or abscess without bleeding: Secondary | ICD-10-CM | POA: Diagnosis not present

## 2015-08-19 DIAGNOSIS — N321 Vesicointestinal fistula: Secondary | ICD-10-CM | POA: Insufficient documentation

## 2015-08-19 DIAGNOSIS — K5792 Diverticulitis of intestine, part unspecified, without perforation or abscess without bleeding: Secondary | ICD-10-CM | POA: Diagnosis not present

## 2015-08-19 MED ORDER — IOPAMIDOL (ISOVUE-300) INJECTION 61%
100.0000 mL | Freq: Once | INTRAVENOUS | Status: AC | PRN
  Administered 2015-08-19: 100 mL via INTRAVENOUS

## 2015-09-02 DIAGNOSIS — K5720 Diverticulitis of large intestine with perforation and abscess without bleeding: Secondary | ICD-10-CM | POA: Diagnosis not present

## 2015-09-05 NOTE — H&P (Signed)
Sheila [name] is an 50 y.o. female.   Chief Complaint: Sigmoid diverticulitis, colovesical fistula HPI: Patient is a 50 year old white female who was recently admitted to the hospital with sigmoid diverticulitis. While she was being treated at home, she began experiencing dysuria and pneumaturia. CT scan of the abdomen revealed sigmoid diverticulitis with a probable colovesical fistula. She has been on ciprofloxacin for control of her dysuria. She now presents for partial colectomy.  Past Medical History  Diagnosis Date  . Diverticulitis   . Migraine headache   . Pelvic abscess in female     see [facility]    Past Surgical History  Procedure Laterality Date  . Colonoscopy  12/05/2010    left sided diverticulosis, melanosis coli. Next TCS at age 50. Procedure: COLONOSCOPY;  Surgeon: Daneil Dolin, MD;  Location: [facility];  Service: Endoscopy;  Laterality: N/A;  8:15AM  . Tonsillectomy      as child  . Cholecystectomy         . Dilation and curettage of uterus  02/06/2011    Procedure: DILATATION AND CURETTAGE (D&C);  Surgeon: Jonnie Kind, MD;  Location: [facility];  Service: Gynecology;  Laterality: N/A;  . Salpingoophorectomy  02/06/2011    Procedure: SALPINGO OOPHERECTOMY;  Surgeon: Jonnie Kind, MD;  Location: [facility];  Service: Gynecology;  Laterality: N/A;  procedure started at 1241  . Laparoscopy  02/06/2011    Procedure: LAPAROSCOPY DIAGNOSTIC;  Surgeon: Jonnie Kind, MD;  Location: [facility];  Service: Gynecology;  Laterality: N/A;    Family History  Problem Relation Age of Onset  . Colon cancer Neg Hx   . Hypertension Father   . Hyperlipidemia Father    Social History:  reports that she has never smoked. She has never used smokeless tobacco. She reports that she does not drink alcohol or use illicit drugs.  Allergies:  Allergies  Allergen Reactions  . Lactose Intolerance (Gi)   . Sulfa Antibiotics Rash    No prescriptions prior to admission    No results found  for this or any previous visit (from the past 48 hour(s)). No results found.  Review of Systems  Constitutional: Positive for malaise/fatigue.  HENT: Negative.   Eyes: Negative.   Respiratory: Negative.   Cardiovascular: Negative.   Gastrointestinal: Positive for abdominal pain. Negative for heartburn, nausea and vomiting.  Genitourinary: Positive for dysuria.  Musculoskeletal: Negative.   Skin: Negative.   Neurological: Negative.   Endo/Heme/Allergies: Negative.   Psychiatric/Behavioral: The patient is nervous/anxious.     Last menstrual period 09/28/2014. Physical Exam  Constitutional: She is oriented to person, place, and time. She appears well-developed and well-nourished.  HENT:  Head: Normocephalic and atraumatic.  Neck: Normal range of motion. Neck supple.  Cardiovascular: Normal rate, regular rhythm and normal heart sounds.   Respiratory: Effort normal and breath sounds normal.  GI: Soft. Bowel sounds are normal. She exhibits no distension. There is tenderness. There is no rebound and no guarding.  Mild tenderness to deep palpation in the suprapubic left lower quadrant regions. No rigidity noted.  Musculoskeletal: Normal range of motion.  Neurological: She is alert and oriented to person, place, and time.  Skin: Skin is warm and dry.  Psychiatric: She has a normal mood and affect. Her behavior is normal. Judgment and thought content normal.     Assessment/Plan Impression: Sigmoid diverticulitis, colovesical fistula Plan: Patient will undergo partial colectomy with possible repair of bladder wall on 09/17/2015. The risks and benefits of the  procedure including bleeding, infection, anastomotic leak, blood transfusion, the possibility of needing a colostomy were fully explained to the patient, who gave informed consent. TriLyte, neomycin, erythromycin have been prescribed.  Jamesetta So, MD 09/05/2015, 9:08 AM

## 2015-09-10 NOTE — Patient Instructions (Signed)
Sheila [name]  09/10/2015     @PREFPERIOPPHARMACY @   Your procedure is scheduled on 09/17/2015.  Report to [facility] at 7:30 A.M.  Call this number if you have problems the morning of surgery:  [phone]   Remember:  Do not eat food or drink liquids after midnight.  Take these medicines the morning of surgery with A SIP OF WATER : Vicodin, Lisinopril and Prilosec   Do not wear jewelry, make-up or nail polish.  Do not wear lotions, powders, or perfumes.  You may wear deodorant.  Do not shave 48 hours prior to surgery.  Men may shave face and neck.  Do not bring valuables to the hospital.  [facility] is not responsible for any belongings or valuables.  Contacts, dentures or bridgework may not be worn into surgery.  Leave your suitcase in the car.  After surgery it may be brought to your room.  For patients admitted to the hospital, discharge time will be determined by your treatment team.  Patients discharged the day of surgery will not be allowed to drive home.   Name and phone number of your driver:   family Special instructions:  n/a  Please read over the following fact sheets that you were given. Care and Recovery After Surgery   General Anesthesia, Adult General anesthesia is a sleep-like state of non-feeling produced by medicines (anesthetics). General anesthesia prevents you from being alert and feeling pain during a medical procedure. Your caregiver may recommend general anesthesia if your procedure:  Is long.  Is painful or uncomfortable.  Would be frightening to see or hear.  Requires you to be still.  Affects your breathing.  Causes significant blood loss. LET YOUR CAREGIVER KNOW ABOUT:  Allergies to food or medicine.  Medicines taken, including vitamins, herbs, eyedrops, over-the-counter medicines, and creams.  Use of steroids (by mouth or creams).  Previous problems with anesthetics or numbing medicines, including problems experienced by  relatives.  History of bleeding problems or blood clots.  Previous surgeries and types of anesthetics received.  Possibility of pregnancy, if this applies.  Use of cigarettes, alcohol, or illegal drugs.  Any health condition(s), especially diabetes, sleep apnea, and high blood pressure. RISKS AND COMPLICATIONS General anesthesia rarely causes complications. However, if complications do occur, they can be life threatening. Complications include:  A lung infection.  A stroke.  A heart attack.  Waking up during the procedure. When this occurs, the patient may be unable to move and communicate that he or she is awake. The patient may feel severe pain. Older adults and adults with serious medical problems are more likely to have complications than adults who are young and healthy. Some complications can be prevented by answering all of your caregiver's questions thoroughly and by following all pre-procedure instructions. It is important to tell your caregiver if any of the pre-procedure instructions, especially those related to diet, were not followed. Any food or liquid in the stomach can cause problems when you are under general anesthesia. BEFORE THE PROCEDURE  Ask your caregiver if you will have to spend the night at the hospital. If you will not have to spend the night, arrange to have an adult drive you and stay with you for 24 hours.  Follow your caregiver's instructions if you are taking dietary supplements or medicines. Your caregiver may tell you to stop taking them or to reduce your dosage.  Do not smoke for as long as possible before your procedure. If possible, stop  smoking 3-6 weeks before the procedure.  Do not take new dietary supplements or medicines within 1 week of your procedure unless your caregiver approves them.  Do not eat within 8 hours of your procedure or as directed by your caregiver. Drink only clear liquids, such as water, black coffee (without milk or cream),  and fruit juices (without pulp).  Do not drink within 3 hours of your procedure or as directed by your caregiver.  You may brush your teeth on the morning of the procedure, but make sure to spit out the toothpaste and water when finished. PROCEDURE  You will receive anesthetics through a mask, through an intravenous (IV) access tube, or through both. A doctor who specializes in anesthesia (anesthesiologist) or a nurse who specializes in anesthesia (nurse anesthetist) or both will stay with you throughout the procedure to make sure you remain unconscious. He or she will also watch your blood pressure, pulse, and oxygen levels to make sure that the anesthetics do not cause any problems. Once you are asleep, a breathing tube or mask may be used to help you breathe. AFTER THE PROCEDURE You will wake up after the procedure is complete. You may be in the room where the procedure was performed or in a recovery area. You may have a sore throat if a breathing tube was used. You may also feel:  Dizzy.  Weak.  Drowsy.  Confused.  Nauseous.  Cold. These are all normal responses and can be expected to last for up to 24 hours after the procedure is complete. A caregiver will tell you when you are ready to go home. This will usually be when you are fully awake and in stable condition.   This information is not intended to replace advice given to you by your health care provider. Make sure you discuss any questions you have with your health care provider.   Document Released: 07/04/2007 Document Revised: 04/17/2014 Document Reviewed: 07/26/2011 Elsevier Interactive Patient Education [employer]. Open Colectomy An open colectomy is surgery to take out part or all of the large intestine (colon). This procedure is used to treat several conditions, including:  Inflammation and infection of the colon (diverticulitis).  Tumors or masses in the colon.  Inflammatory bowel disease, such as Crohn  disease or ulcerative colitis. Colectomy is an option when symptoms cannot be controlled with medicines.  Bleeding from the colon that cannot be controlled by another method.  Blockage or obstruction of the colon. LET [facility] CARE PROVIDER KNOW ABOUT:  Any allergies you have.  All medicines you are taking, including vitamins, herbs, eye drops, creams, and over-the-counter medicines.  Previous problems you or members of your family have had with the use of anesthetics.  Any blood disorders you have.  Previous surgeries you have had.  Medical conditions you have. RISKS AND COMPLICATIONS  Generally, this is a safe procedure. However, as with any procedure, complications can occur. Possible complications include:  An infection developing in the area where the surgery was done.  Problems with the incisions, including:  Bleeding from an incision.  The wound reopening.  Tissues from inside the abdomen bulging through the incision (hernia).  Bleeding inside the abdomen.  Reopening of the colon where it was stitched or stapled together. This is a serious complication. Another procedure may be needed to fix the problem.  Damage to other organs in the abdomen.  A blood clot forming in a vein and traveling to the lungs.  Future blockage of  the small intestine from scar tissue. Another surgery may be needed to repair this. BEFORE THE PROCEDURE  Various tests may be done before the procedure. These may include:  Blood tests.  A test to check the heart's rhythm (electrocardiography).  A CT scan to get pictures of your abdomen.  Ask your health care provider about changing or stopping any regular medicines. You will need to stop taking aspirin and nonsteroidal anti-inflammatory drugs (NSAIDs) at least 5 days before the surgery. You will also need to stop taking any blood thinners and vitamin E.  You may be prescribed an oral bowel prep. This involves drinking a large amount of  medicated liquid, starting the day before your surgery. The liquid will cause you to have multiple loose stools until your stool is almost clear or light green. This cleans out your colon in preparation for the surgery.  Do not eat or drink anything else once you have started the bowel prep, unless your health care provider tells you it is safe to do so.  You may also be given antibiotic pills to clean out your colon of bacteria. Be sure to follow the directions carefully and take the medicine at the correct time.  Make plans to have someone drive you home after your hospital stay. Also arrange for someone to help you with activities during your recovery. PROCEDURE This surgery can take 2-4 hours.  Small monitors will be put on your body. They are used to check your heart, blood pressure, and oxygen level.  An IV access tube will be put into one of your veins. Medicine will be able to flow directly into your body through this IV tube.  You might be given a medicine to help you relax (sedative).  You will be given a medicine to make you sleep through the procedure (general anesthetic). A breathing tube may be placed into your lungs during the procedure.  A thin, flexible tube (catheter) will be placed into your bladder to collect urine.  A tube may be put in through your nose. It is called a nasogastric tube. It is used to remove stomach fluids after surgery until the intestines start working again.  Once you are asleep, the surgeon will make an incision in the abdomen.  Clamps or staples are put on both ends of the diseased part of the colon.  The part of the intestine between the clamps or staples is removed.  If possible, the ends of the healthy colon that remain will be stitched or stapled together to allow your body to expel waste (stool).  Sometimes, the remaining colon cannot be stitched back together. If this is the case, a colostomy is needed. For a colostomy:  An opening  (stoma) to the outside of your body is made through the abdomen.  The end of the colon is brought to the opening. It is stitched to the skin.  A bag is attached to the opening. Stool will drain into this bag. The bag is removable.  The colostomy can be temporary or permanent.  The incision from the colectomy will be closed with stitches or staples. AFTER THE PROCEDURE  You will stay in a recovery area until the anesthetic has worn off. Your blood pressure and pulse will be checked often. Then you will be taken to a hospital room.  You will continue to get fluids through the IV tube for a while. The IV tube will be taken out when the colon starts working again.  You  will start on clear liquids and gradually go back to a normal diet.  You will be encouraged to cough and to take deep breaths to open your lungs and prevent pneumonia.  Some pain is normal after a colectomy. You will be given pain medicine as needed.  You will be urged to get up and start walking within a day.  If you had a colostomy, your health care provider will explain how it works and what you will need to do.  You will likely need to stay in the hospital for 3-7 days.   This information is not intended to replace advice given to you by your health care provider. Make sure you discuss any questions you have with your health care provider.   Document Released: 01/22/2009 Document Revised: 01/15/2013 Document Reviewed: 11/06/2012 Elsevier Interactive Patient Education [employer].

## 2015-09-13 ENCOUNTER — Encounter (HOSPITAL_COMMUNITY): Payer: Self-pay

## 2015-09-13 ENCOUNTER — Encounter (HOSPITAL_COMMUNITY)
Admission: RE | Admit: 2015-09-13 | Discharge: 2015-09-13 | Disposition: A | Discharge status: Home / Self Care | Payer: BLUE CROSS/BLUE SHIELD | Source: Ambulatory Visit | Attending: General Surgery | Admitting: General Surgery

## 2015-09-13 DIAGNOSIS — Z01818 Encounter for other preprocedural examination: Secondary | ICD-10-CM | POA: Diagnosis not present

## 2015-09-13 DIAGNOSIS — K5792 Diverticulitis of intestine, part unspecified, without perforation or abscess without bleeding: Secondary | ICD-10-CM | POA: Diagnosis not present

## 2015-09-13 DIAGNOSIS — Z0183 Encounter for blood typing: Secondary | ICD-10-CM | POA: Diagnosis not present

## 2015-09-13 DIAGNOSIS — Z01812 Encounter for preprocedural laboratory examination: Secondary | ICD-10-CM | POA: Insufficient documentation

## 2015-09-13 HISTORY — DX: Essential (primary) hypertension: I10

## 2015-09-13 HISTORY — DX: Gastro-esophageal reflux disease without esophagitis: K21.9

## 2015-09-13 LAB — CBC WITH DIFFERENTIAL/PLATELET
Basophils Absolute: 0 10*3/uL (ref 0.0–0.1)
Basophils Relative: 0 %
Eosinophils Absolute: 0.2 10*3/uL (ref 0.0–0.7)
Eosinophils Relative: 4 %
HEMATOCRIT: 39.4 % (ref 36.0–46.0)
HEMOGLOBIN: 12.6 g/dL (ref 12.0–15.0)
LYMPHS ABS: 1.4 10*3/uL (ref 0.7–4.0)
Lymphocytes Relative: 33 %
MCH: 28.7 pg (ref 26.0–34.0)
MCHC: 32 g/dL (ref 30.0–36.0)
MCV: 89.7 fL (ref 78.0–100.0)
MONOS PCT: 9 %
Monocytes Absolute: 0.4 10*3/uL (ref 0.1–1.0)
NEUTROS ABS: 2.2 10*3/uL (ref 1.7–7.7)
NEUTROS PCT: 54 %
Platelets: 319 10*3/uL (ref 150–400)
RBC: 4.39 MIL/uL (ref 3.87–5.11)
RDW: 12.7 % (ref 11.5–15.5)
WBC: 4.1 10*3/uL (ref 4.0–10.5)

## 2015-09-13 LAB — BASIC METABOLIC PANEL
ANION GAP: 8 (ref 5–15)
BUN: 9 mg/dL (ref 6–20)
CHLORIDE: 104 mmol/L (ref 101–111)
CO2: 27 mmol/L (ref 22–32)
Calcium: 9.5 mg/dL (ref 8.9–10.3)
Creatinine, Ser: 0.6 mg/dL (ref 0.44–1.00)
GFR calc non Af Amer: >60 mL/min (ref >60)
Glucose, Bld: 73 mg/dL (ref 65–99)
POTASSIUM: 4.1 mmol/L (ref 3.5–5.1)
Sodium: 139 mmol/L (ref 135–145)

## 2015-09-17 ENCOUNTER — Inpatient Hospital Stay (HOSPITAL_COMMUNITY): Payer: BLUE CROSS/BLUE SHIELD | Admitting: Anesthesiology

## 2015-09-17 ENCOUNTER — Encounter (HOSPITAL_COMMUNITY): Payer: Self-pay | Admitting: *Deleted

## 2015-09-17 ENCOUNTER — Encounter (HOSPITAL_COMMUNITY)
Admission: RE | Disposition: A | Discharge status: Home / Self Care | Payer: Self-pay | Source: Ambulatory Visit | Attending: General Surgery

## 2015-09-17 ENCOUNTER — Inpatient Hospital Stay (HOSPITAL_COMMUNITY)
Admission: RE | Admit: 2015-09-17 | Discharge: 2015-09-21 | DRG: 330 | Disposition: A | Discharge status: Home / Self Care | Payer: BLUE CROSS/BLUE SHIELD | Source: Ambulatory Visit | Attending: General Surgery | Admitting: General Surgery

## 2015-09-17 DIAGNOSIS — I9581 Postprocedural hypotension: Secondary | ICD-10-CM | POA: Diagnosis not present

## 2015-09-17 DIAGNOSIS — D62 Acute posthemorrhagic anemia: Secondary | ICD-10-CM | POA: Diagnosis not present

## 2015-09-17 DIAGNOSIS — Z8249 Family history of ischemic heart disease and other diseases of the circulatory system: Secondary | ICD-10-CM | POA: Diagnosis not present

## 2015-09-17 DIAGNOSIS — N321 Vesicointestinal fistula: Secondary | ICD-10-CM | POA: Diagnosis present

## 2015-09-17 DIAGNOSIS — I10 Essential (primary) hypertension: Secondary | ICD-10-CM | POA: Diagnosis present

## 2015-09-17 DIAGNOSIS — F411 Generalized anxiety disorder: Secondary | ICD-10-CM | POA: Diagnosis present

## 2015-09-17 DIAGNOSIS — K219 Gastro-esophageal reflux disease without esophagitis: Secondary | ICD-10-CM | POA: Diagnosis present

## 2015-09-17 DIAGNOSIS — K5732 Diverticulitis of large intestine without perforation or abscess without bleeding: Principal | ICD-10-CM | POA: Diagnosis present

## 2015-09-17 DIAGNOSIS — K5720 Diverticulitis of large intestine with perforation and abscess without bleeding: Secondary | ICD-10-CM | POA: Diagnosis not present

## 2015-09-17 DIAGNOSIS — R109 Unspecified abdominal pain: Secondary | ICD-10-CM | POA: Diagnosis present

## 2015-09-17 DIAGNOSIS — Z9049 Acquired absence of other specified parts of digestive tract: Secondary | ICD-10-CM

## 2015-09-17 HISTORY — PX: PARTIAL COLECTOMY: SHX5273

## 2015-09-17 SURGERY — COLECTOMY, PARTIAL
Anesthesia: General

## 2015-09-17 MED ORDER — FENTANYL CITRATE (PF) 100 MCG/2ML IJ SOLN
25.0000 ug | INTRAMUSCULAR | Status: DC | PRN
  Administered 2015-09-17 (×2): 50 ug via INTRAVENOUS
  Filled 2015-09-17: qty 2

## 2015-09-17 MED ORDER — ONDANSETRON 4 MG PO TBDP: 4.0000 mg | ORAL_TABLET | Freq: Four times a day (QID) | ORAL | Status: DC | PRN

## 2015-09-17 MED ORDER — DEXAMETHASONE SODIUM PHOSPHATE 4 MG/ML IJ SOLN
INTRAMUSCULAR | Status: AC
  Filled 2015-09-17: qty 1

## 2015-09-17 MED ORDER — ROCURONIUM BROMIDE 50 MG/5ML IV SOLN
INTRAVENOUS | Status: AC
  Filled 2015-09-17: qty 1

## 2015-09-17 MED ORDER — LORAZEPAM 2 MG/ML IJ SOLN: 1.0000 mg | INTRAMUSCULAR | Status: DC | PRN

## 2015-09-17 MED ORDER — POVIDONE-IODINE 10 % EX OINT
TOPICAL_OINTMENT | CUTANEOUS | Status: AC
  Filled 2015-09-17: qty 2

## 2015-09-17 MED ORDER — LIDOCAINE HCL (PF) 1 % IJ SOLN
INTRAMUSCULAR | Status: AC
  Filled 2015-09-17: qty 5

## 2015-09-17 MED ORDER — FENTANYL CITRATE (PF) 250 MCG/5ML IJ SOLN
INTRAMUSCULAR | Status: AC
  Filled 2015-09-17: qty 5

## 2015-09-17 MED ORDER — SIMETHICONE 80 MG PO CHEW
40.0000 mg | CHEWABLE_TABLET | Freq: Four times a day (QID) | ORAL | Status: DC | PRN
  Administered 2015-09-18: 40 mg via ORAL
  Filled 2015-09-17: qty 1

## 2015-09-17 MED ORDER — PROPOFOL 10 MG/ML IV BOLUS
INTRAVENOUS | Status: AC
  Filled 2015-09-17: qty 20

## 2015-09-17 MED ORDER — ENOXAPARIN SODIUM 40 MG/0.4ML ~~LOC~~ SOLN
40.0000 mg | SUBCUTANEOUS | Status: DC
  Administered 2015-09-18: 40 mg via SUBCUTANEOUS
  Filled 2015-09-17: qty 0.4

## 2015-09-17 MED ORDER — GLYCOPYRROLATE 0.2 MG/ML IJ SOLN
INTRAMUSCULAR | Status: AC
  Filled 2015-09-17: qty 4

## 2015-09-17 MED ORDER — BUPIVACAINE LIPOSOME 1.3 % IJ SUSP
INTRAMUSCULAR | Status: AC
  Filled 2015-09-17: qty 20

## 2015-09-17 MED ORDER — CEFOTETAN DISODIUM-DEXTROSE 2-2.08 GM-% IV SOLR
2.0000 g | INTRAVENOUS | Status: AC
  Administered 2015-09-17: 2 g via INTRAVENOUS
  Filled 2015-09-17: qty 50

## 2015-09-17 MED ORDER — FENTANYL CITRATE (PF) 100 MCG/2ML IJ SOLN
INTRAMUSCULAR | Status: AC
  Filled 2015-09-17: qty 2

## 2015-09-17 MED ORDER — ONDANSETRON HCL 4 MG/2ML IJ SOLN: 4.0000 mg | Freq: Four times a day (QID) | INTRAMUSCULAR | Status: DC | PRN

## 2015-09-17 MED ORDER — LIDOCAINE HCL (CARDIAC) 10 MG/ML IV SOLN
INTRAVENOUS | Status: DC | PRN
  Administered 2015-09-17: 50 mg via INTRAVENOUS

## 2015-09-17 MED ORDER — ALVIMOPAN 12 MG PO CAPS
12.0000 mg | ORAL_CAPSULE | Freq: Once | ORAL | Status: AC
  Administered 2015-09-17: 12 mg via ORAL
  Filled 2015-09-17: qty 1

## 2015-09-17 MED ORDER — CHLORHEXIDINE GLUCONATE 4 % EX LIQD: 1.0000 "application " | Freq: Once | CUTANEOUS | Status: DC

## 2015-09-17 MED ORDER — 0.9 % SODIUM CHLORIDE (POUR BTL) OPTIME
TOPICAL | Status: DC | PRN
  Administered 2015-09-17: 2000 mL

## 2015-09-17 MED ORDER — KETOROLAC TROMETHAMINE 30 MG/ML IJ SOLN
30.0000 mg | Freq: Once | INTRAMUSCULAR | Status: AC
  Administered 2015-09-17: 30 mg via INTRAVENOUS
  Filled 2015-09-17: qty 1

## 2015-09-17 MED ORDER — PANTOPRAZOLE SODIUM 40 MG PO TBEC
40.0000 mg | DELAYED_RELEASE_TABLET | Freq: Every day | ORAL | Status: DC
  Administered 2015-09-17 – 2015-09-21 (×5): 40 mg via ORAL
  Filled 2015-09-17 (×5): qty 1

## 2015-09-17 MED ORDER — FENTANYL CITRATE (PF) 100 MCG/2ML IJ SOLN
INTRAMUSCULAR | Status: DC | PRN
  Administered 2015-09-17 (×4): 50 ug via INTRAVENOUS
  Administered 2015-09-17 (×2): 25 ug via INTRAVENOUS
  Administered 2015-09-17 (×2): 50 ug via INTRAVENOUS

## 2015-09-17 MED ORDER — MIDAZOLAM HCL 2 MG/2ML IJ SOLN
1.0000 mg | INTRAMUSCULAR | Status: DC | PRN
  Administered 2015-09-17 (×2): 2 mg via INTRAVENOUS
  Filled 2015-09-17 (×2): qty 2

## 2015-09-17 MED ORDER — POVIDONE-IODINE 10 % OINT PACKET
TOPICAL_OINTMENT | CUTANEOUS | Status: DC | PRN
  Administered 2015-09-17: 2 via TOPICAL

## 2015-09-17 MED ORDER — ENOXAPARIN SODIUM 40 MG/0.4ML ~~LOC~~ SOLN
40.0000 mg | Freq: Once | SUBCUTANEOUS | Status: AC
  Administered 2015-09-17: 40 mg via SUBCUTANEOUS
  Filled 2015-09-17: qty 0.4

## 2015-09-17 MED ORDER — GLYCOPYRROLATE 0.2 MG/ML IJ SOLN
INTRAMUSCULAR | Status: DC | PRN
  Administered 2015-09-17: 0.6 mg via INTRAVENOUS

## 2015-09-17 MED ORDER — ACETAMINOPHEN 325 MG PO TABS: 650.0000 mg | ORAL_TABLET | Freq: Four times a day (QID) | ORAL | Status: DC | PRN

## 2015-09-17 MED ORDER — SIMETHICONE 80 MG PO CHEW
80.0000 mg | CHEWABLE_TABLET | Freq: Four times a day (QID) | ORAL | Status: DC | PRN
  Filled 2015-09-17: qty 1

## 2015-09-17 MED ORDER — ONDANSETRON HCL 4 MG/2ML IJ SOLN
4.0000 mg | Freq: Once | INTRAMUSCULAR | Status: AC
  Administered 2015-09-17: 4 mg via INTRAVENOUS
  Filled 2015-09-17: qty 2

## 2015-09-17 MED ORDER — ALVIMOPAN 12 MG PO CAPS
12.0000 mg | ORAL_CAPSULE | Freq: Two times a day (BID) | ORAL | Status: DC
  Administered 2015-09-18 – 2015-09-19 (×5): 12 mg via ORAL
  Filled 2015-09-17 (×4): qty 1

## 2015-09-17 MED ORDER — ALUM & MAG HYDROXIDE-SIMETH 200-200-20 MG/5ML PO SUSP
30.0000 mL | ORAL | Status: DC | PRN
  Administered 2015-09-18 – 2015-09-20 (×6): 30 mL via ORAL
  Filled 2015-09-17 (×6): qty 30

## 2015-09-17 MED ORDER — DIPHENHYDRAMINE HCL 50 MG/ML IJ SOLN: 25.0000 mg | Freq: Four times a day (QID) | INTRAMUSCULAR | Status: DC | PRN

## 2015-09-17 MED ORDER — NEOSTIGMINE METHYLSULFATE 10 MG/10ML IV SOLN
INTRAVENOUS | Status: AC
  Filled 2015-09-17: qty 1

## 2015-09-17 MED ORDER — BUPIVACAINE HCL (PF) 0.5 % IJ SOLN
INTRAMUSCULAR | Status: AC
  Filled 2015-09-17: qty 30

## 2015-09-17 MED ORDER — HYDROMORPHONE HCL 1 MG/ML IJ SOLN
1.0000 mg | INTRAMUSCULAR | Status: DC | PRN
  Administered 2015-09-17 – 2015-09-18 (×5): 1 mg via INTRAVENOUS
  Filled 2015-09-17 (×5): qty 1

## 2015-09-17 MED ORDER — OXYCODONE-ACETAMINOPHEN 5-325 MG PO TABS
1.0000 | ORAL_TABLET | ORAL | Status: DC | PRN
  Administered 2015-09-17 – 2015-09-21 (×11): 2 via ORAL
  Filled 2015-09-17 (×11): qty 2

## 2015-09-17 MED ORDER — NEOSTIGMINE METHYLSULFATE 10 MG/10ML IV SOLN
INTRAVENOUS | Status: DC | PRN
  Administered 2015-09-17: 4 mg via INTRAVENOUS

## 2015-09-17 MED ORDER — PROPOFOL 10 MG/ML IV BOLUS
INTRAVENOUS | Status: DC | PRN
  Administered 2015-09-17: 140 mg via INTRAVENOUS
  Administered 2015-09-17 (×2): 30 mg via INTRAVENOUS

## 2015-09-17 MED ORDER — DEXAMETHASONE SODIUM PHOSPHATE 4 MG/ML IJ SOLN
4.0000 mg | Freq: Once | INTRAMUSCULAR | Status: AC
  Administered 2015-09-17: 4 mg via INTRAVENOUS
  Filled 2015-09-17: qty 1

## 2015-09-17 MED ORDER — DIPHENHYDRAMINE HCL 25 MG PO CAPS: 25.0000 mg | ORAL_CAPSULE | Freq: Four times a day (QID) | ORAL | Status: DC | PRN

## 2015-09-17 MED ORDER — BUPIVACAINE LIPOSOME 1.3 % IJ SUSP
INTRAMUSCULAR | Status: DC | PRN
  Administered 2015-09-17: 20 mL

## 2015-09-17 MED ORDER — ROCURONIUM BROMIDE 100 MG/10ML IV SOLN
INTRAVENOUS | Status: DC | PRN
  Administered 2015-09-17: 5 mg via INTRAVENOUS
  Administered 2015-09-17 (×3): 10 mg via INTRAVENOUS
  Administered 2015-09-17: 45 mg via INTRAVENOUS

## 2015-09-17 MED ORDER — ACETAMINOPHEN 650 MG RE SUPP: 650.0000 mg | Freq: Four times a day (QID) | RECTAL | Status: DC | PRN

## 2015-09-17 MED ORDER — DEXAMETHASONE SODIUM PHOSPHATE 4 MG/ML IJ SOLN
INTRAMUSCULAR | Status: DC | PRN
  Administered 2015-09-17: 4 mg via INTRAVENOUS

## 2015-09-17 MED ORDER — ONDANSETRON HCL 4 MG/2ML IJ SOLN
4.0000 mg | Freq: Once | INTRAMUSCULAR | Status: AC | PRN
  Administered 2015-09-17: 4 mg via INTRAVENOUS
  Filled 2015-09-17: qty 2

## 2015-09-17 MED ORDER — HEMOSTATIC AGENTS (NO CHARGE) OPTIME
TOPICAL | Status: DC | PRN
  Administered 2015-09-17 (×2): 1 via TOPICAL

## 2015-09-17 MED ORDER — LACTATED RINGERS IV SOLN
INTRAVENOUS | Status: DC
  Administered 2015-09-17 – 2015-09-18 (×4): via INTRAVENOUS

## 2015-09-17 MED ORDER — LACTATED RINGERS IV SOLN
INTRAVENOUS | Status: DC
  Administered 2015-09-17 (×4): via INTRAVENOUS

## 2015-09-17 SURGICAL SUPPLY — 75 items
APPLIER CLIP 11 MED OPEN (CLIP)
APPLIER CLIP 13 LRG OPEN (CLIP) ×2
BAG HAMPER (MISCELLANEOUS) ×2 IMPLANT
BARRIER SKIN 2 3/4 (OSTOMY) IMPLANT
CELLS DAT CNTRL 66122 CELL SVR (MISCELLANEOUS) IMPLANT
CHLORAPREP W/TINT 26ML (MISCELLANEOUS) ×2 IMPLANT
CLAMP POUCH DRAINAGE QUIET (OSTOMY) IMPLANT
CLIP APPLIE 11 MED OPEN (CLIP) IMPLANT
CLIP APPLIE 13 LRG OPEN (CLIP) ×1 IMPLANT
CLOTH BEACON ORANGE TIMEOUT ST (SAFETY) ×2 IMPLANT
COVER LIGHT HANDLE STERIS (MISCELLANEOUS) ×4 IMPLANT
COVER MAYO STAND XLG (DRAPE) ×2 IMPLANT
DRAPE UTILITY W/TAPE 26X15 (DRAPES) ×4 IMPLANT
DRAPE WARM FLUID 44X44 (DRAPE) ×2 IMPLANT
DRSG OPSITE POSTOP 4X10 (GAUZE/BANDAGES/DRESSINGS) ×2 IMPLANT
DRSG OPSITE POSTOP 4X8 (GAUZE/BANDAGES/DRESSINGS) IMPLANT
DRSG TEGADERM 4X4.75 (GAUZE/BANDAGES/DRESSINGS) ×2 IMPLANT
ELECT BLADE 6 FLAT ULTRCLN (ELECTRODE) IMPLANT
ELECT REM PT RETURN 9FT ADLT (ELECTROSURGICAL) ×2
ELECTRODE REM PT RTRN 9FT ADLT (ELECTROSURGICAL) ×1 IMPLANT
FORMALIN 10 PREFIL 480ML (MISCELLANEOUS) IMPLANT
GLOVE BIOGEL PI IND STRL 7.0 (GLOVE) ×1 IMPLANT
GLOVE BIOGEL PI INDICATOR 7.0 (GLOVE) ×1
GLOVE SURG SS PI 7.5 STRL IVOR (GLOVE) ×6 IMPLANT
GOWN STRL REUS W/ TWL XL LVL3 (GOWN DISPOSABLE) ×2 IMPLANT
GOWN STRL REUS W/TWL LRG LVL3 (GOWN DISPOSABLE) ×8 IMPLANT
GOWN STRL REUS W/TWL XL LVL3 (GOWN DISPOSABLE) ×2
HANDLE SUCTION POOLE (INSTRUMENTS) ×1 IMPLANT
HEMOSTAT ARISTA ABSORB 3G PWDR (MISCELLANEOUS) ×2 IMPLANT
HEMOSTAT SURGICEL 4X8 (HEMOSTASIS) ×2 IMPLANT
INST SET MAJOR GENERAL (KITS) ×2 IMPLANT
KIT BLADEGUARD II DBL (SET/KITS/TRAYS/PACK) ×2 IMPLANT
KIT ROOM TURNOVER APOR (KITS) ×2 IMPLANT
LIGASURE IMPACT 36 18CM CVD LR (INSTRUMENTS) ×2 IMPLANT
MANIFOLD NEPTUNE II (INSTRUMENTS) ×2 IMPLANT
NEEDLE HYPO 18GX1.5 BLUNT FILL (NEEDLE) ×2 IMPLANT
NEEDLE HYPO 21X1.5 SAFETY (NEEDLE) ×2 IMPLANT
NS IRRIG 1000ML POUR BTL (IV SOLUTION) ×6 IMPLANT
PACK ABDOMINAL MAJOR (CUSTOM PROCEDURE TRAY) ×2 IMPLANT
PAD ARMBOARD 7.5X6 YLW CONV (MISCELLANEOUS) ×2 IMPLANT
PENCIL HANDSWITCHING (ELECTRODE) ×4 IMPLANT
POUCH OSTOMY 2 3/4  H 3804 (WOUND CARE)
POUCH OSTOMY 2 PC DRNBL 2.25 (WOUND CARE) IMPLANT
POUCH OSTOMY 2 PC DRNBL 2.75 (WOUND CARE) IMPLANT
POUCH OSTOMY DRNBL 2 1/4 (WOUND CARE)
RELOAD LINEAR CUT PROX 55 BLUE (ENDOMECHANICALS) IMPLANT
RELOAD PROXIMATE 75MM BLUE (ENDOMECHANICALS) ×8 IMPLANT
RETRACTOR WND ALEXIS 25 LRG (MISCELLANEOUS) ×1 IMPLANT
RTRCTR WOUND ALEXIS 18CM MED (MISCELLANEOUS)
RTRCTR WOUND ALEXIS 25CM LRG (MISCELLANEOUS) ×2
SET BASIN LINEN APH (SET/KITS/TRAYS/PACK) ×2 IMPLANT
SPONGE LAP 18X18 X RAY DECT (DISPOSABLE) ×4 IMPLANT
SPONGE SURGIFOAM ABS GEL 100 (HEMOSTASIS) ×2 IMPLANT
STAPLER GUN LINEAR PROX 60 (STAPLE) ×2 IMPLANT
STAPLER PROXIMATE 55 BLUE (STAPLE) IMPLANT
STAPLER PROXIMATE 75MM BLUE (STAPLE) ×2 IMPLANT
STAPLER ROTICULATOR 4.8 (STAPLE) ×2 IMPLANT
STAPLER VISISTAT (STAPLE) ×2 IMPLANT
SUCTION POOLE HANDLE (INSTRUMENTS) ×2
SUCTION YANKAUER HANDLE (MISCELLANEOUS) ×2 IMPLANT
SUT CHROMIC 0 SH (SUTURE) IMPLANT
SUT CHROMIC 2 0 SH (SUTURE) ×2 IMPLANT
SUT CHROMIC 3 0 SH 27 (SUTURE) IMPLANT
SUT NOVA NAB GS-26 0 60 (SUTURE) IMPLANT
SUT PDS AB 0 CTX 60 (SUTURE) ×4 IMPLANT
SUT SILK 2 0 (SUTURE)
SUT SILK 2 0 REEL (SUTURE) IMPLANT
SUT SILK 2-0 18XBRD TIE 12 (SUTURE) IMPLANT
SUT SILK 3 0 SH CR/8 (SUTURE) ×4 IMPLANT
SUT VIC AB 3-0 SH 27 (SUTURE) ×1
SUT VIC AB 3-0 SH 27X BRD (SUTURE) ×1 IMPLANT
SYR 20CC LL (SYRINGE) ×2 IMPLANT
TOWEL BLUE STERILE X RAY DET (MISCELLANEOUS) IMPLANT
TOWEL OR 17X26 4PK STRL BLUE (TOWEL DISPOSABLE) IMPLANT
TRAY FOLEY CATH SILVER 16FR (SET/KITS/TRAYS/PACK) ×2 IMPLANT

## 2015-09-17 NOTE — Anesthesia Postprocedure Evaluation (Signed)
Anesthesia Post Note  Patient: Sheila [name]  Procedure(s) Performed: Procedure(s) (LRB): PARTIAL COLECTOMY, REPAIR OF BLADDER FISTULA (N/A)  Patient location during evaluation: PACU Anesthesia Type: General Level of consciousness: awake Pain management: pain level controlled Vital Signs Assessment: post-procedure vital signs reviewed and stable Respiratory status: spontaneous breathing Cardiovascular status: stable Anesthetic complications: no    Last Vitals:  Filed Vitals:   09/17/15 1135 09/17/15 1145  BP: 163/86 157/82  Pulse: 86 87  Temp: 36.8 C   Resp: 27 22    Last Pain:  Filed Vitals:   09/17/15 1156  PainSc: Asleep                 Drucie Opitz

## 2015-09-17 NOTE — Transfer of Care (Signed)
Immediate Anesthesia Transfer of Care Note  Patient: Sheila [name]  Procedure(s) Performed: Procedure(s): PARTIAL COLECTOMY, REPAIR OF BLADDER FISTULA (N/A)  Patient Location: PACU  Anesthesia Type:General  Level of Consciousness: sedated and patient cooperative  Airway & Oxygen Therapy: Patient Spontanous Breathing and non-rebreather face mask  Post-op Assessment: Report given to RN, Post -op Vital signs reviewed and stable and Patient moving all extremities  Post vital signs: Reviewed and stable    Last Pain:  Filed Vitals:   09/17/15 0901  PainSc: 5       Patients Stated Pain Goal: 7 ([id number])  Complications: No apparent anesthesia complications

## 2015-09-17 NOTE — Anesthesia Procedure Notes (Signed)
Procedure Name: Intubation Date/Time: 09/17/2015 9:18 AM Performed by: Vista Deck Pre-anesthesia Checklist: Patient identified, Emergency Drugs available, Suction available, Patient being monitored and Timeout performed Patient Re-evaluated:Patient Re-evaluated prior to inductionOxygen Delivery Method: Circle system utilized Preoxygenation: Pre-oxygenation with 100% oxygen Intubation Type: IV induction and Cricoid Pressure applied Ventilation: Mask ventilation without difficulty Laryngoscope Size: Mac, 3 and Glidescope Grade View: Grade II Tube type: Oral Tube size: 7.0 mm Number of attempts: 2 Airway Equipment and Method: Oral airway Placement Confirmation: ETT inserted through vocal cords under direct vision,  positive ETCO2 and breath sounds checked- equal and bilateral Secured at: 20 cm Tube secured with: Tape Dental Injury: Teeth and Oropharynx as per pre-operative assessment  Comments: Poor view with MAC 3. Switch to Glidescope 3. Grade 1 view.

## 2015-09-17 NOTE — Anesthesia Postprocedure Evaluation (Signed)
Anesthesia Post Note  Patient: Sheila [name]  Procedure(s) Performed: Procedure(s) (LRB): PARTIAL COLECTOMY, REPAIR OF BLADDER FISTULA (N/A)  Patient location during evaluation: Nursing Unit Anesthesia Type: General Level of consciousness: awake Pain management: satisfactory to patient Vital Signs Assessment: post-procedure vital signs reviewed and stable Respiratory status: spontaneous breathing Cardiovascular status: stable Anesthetic complications: no    Last Vitals:  Filed Vitals:   09/17/15 1230 09/17/15 1252  BP: 125/73 120/75  Pulse: 102 103  Temp:  36.8 C  Resp: 11 14    Last Pain:  Filed Vitals:   09/17/15 1532  PainSc: 6                  Ahlani Wickes

## 2015-09-17 NOTE — Anesthesia Preprocedure Evaluation (Signed)
Anesthesia Evaluation  Patient identified by MRN, date of birth, ID band Patient awake    Reviewed: Allergy & Precautions, H&P , NPO status , Patient's Chart, lab work & pertinent test results  History of Anesthesia Complications Negative for: history of anesthetic complications  Airway Mallampati: II       Dental  (+) Teeth Intact   Pulmonary neg pulmonary ROS,    breath sounds clear to auscultation       Cardiovascular hypertension, negative cardio ROS   Rhythm:Regular Rate:Normal     Neuro/Psych  Headaches,    GI/Hepatic neg GERD  ,  Endo/Other    Renal/GU      Musculoskeletal   Abdominal   Peds  Hematology   Anesthesia Other Findings   Reproductive/Obstetrics                             Anesthesia Physical Anesthesia Plan  ASA: II  Anesthesia Plan: General   Post-op Pain Management:    Induction: Intravenous  Airway Management Planned: Oral ETT  Additional Equipment:   Intra-op Plan:   Post-operative Plan: Extubation in OR  Informed Consent:   Plan Discussed with:   Anesthesia Plan Comments:         Anesthesia Quick Evaluation

## 2015-09-17 NOTE — Op Note (Signed)
Patient:  Sheila [name]  DOB:  02-Oct-1965  MRN:  [id number]   Preop Diagnosis:  Sigmoid colon diverticulitis, colovesical fistula  Postop Diagnosis:  Same  Procedure:  Partial colectomy, closure of colovesical fistula  Surgeon:  Aviva Signs, M.D.  Assistant: Tama High, M.D.  Anes:  Gen. endotracheal  Indications:  Patient is a 50 year old white female who was recently diagnosed with sigmoid diverticulitis was also found on CT scan the abdomen to have a colovesical fistula. The patient has been on antibiotics and now presents for an elective partial colectomy with closure of the colovesical fistula. The risks and benefits of the procedure including bleeding, infection, anastomotic leak, possible blood transfusion, and the possibility of a colostomy were fully explained to the patient, who gave informed consent.  Procedure note:  The patient was placed in the supine position. After induction of general endotracheal anesthesia, the abdomen was prepped and draped using the usual sterile technique with ChloraPrep. Surgical site confirmation was performed.  Midline incision was made below the umbilicus to suprapubic region. The peritoneal cavity was entered into without difficulty. The distal sigmoid colon was noted to be exchanged along with a segment of small bowel down into the right pelvis. These most significant inflammation was along the right pelvic floor wall in the right adnexal region. A small punctate attachment of small bowel was noted to the dome of the bladder. This was removed sharply without difficulty. A 3-0 Vicryl sutures 2 were placed to close the bladder fistula. There was no obvious fistulous track in the small bowel.  . The sigmoid colon was mobilized along its peritoneal reflection. This was taken superiorly towards the splenic flexure. Care was taken to avoid the left ureter. A GIA-75 stapler was placed across the mid sigmoid colon and fired. This was likewise done at  approximately the colorectal juncture. A suture was placed superiorly for orientation purposes. The mesentery was then divided using the LigaSure and the involved segment of sigmoid colon was removed from the operative field and sent to pathology further examination. A side to side anastomosis was performed using a GIA-75 stapler. The colotomy was closed using an articulating 55 stapler. The staple line was bolstered using 3-0 silk Lembert sutures. A patent anastomosis was present. Surgicel and Gelfoam were then placed into the pelvis. A bleeding was controlled using clips or Bovie electrocautery. The pelvis was copiously irrigated with normal saline. The small bowel and distal colon were inspected and noted to be within normal limits. The patient was also noted to have a normal appendix. The bowel was returned into the abdominal cavity in an orderly fashion. All operating personnel then changed gowns and gloves. A new setup was then used. The fascia was reapproximated using a looped 0 PDS running suture. Subcutaneous layer was irrigated with normal saline. Exparel was instilled into the surrounding wound. The skin was closed using staples. Venodyne ointment and dry sterile dressings were applied.  All tape and needle counts were correct at the end of the procedure. Patient was extubated in the operating room and transferred to PACU in stable condition.  Complications:  None  EBL:  250cc  Specimen:  Sigmoid colon, suture proximal

## 2015-09-17 NOTE — Interval H&P Note (Signed)
History and Physical Interval Note:  09/17/2015 8:29 AM  Sheila [name]  has presented today for surgery, with the diagnosis of sigmoid diverticulitis  The various methods of treatment have been discussed with the patient and family. After consideration of risks, benefits and other options for treatment, the patient has consented to  Procedure(s): PARTIAL COLECTOMY (N/A) as a surgical intervention .  The patient's history has been reviewed, patient examined, no change in status, stable for surgery.  I have reviewed the patient's chart and labs.  Questions were answered to the patient's satisfaction.     Aviva Signs A

## 2015-09-17 NOTE — Addendum Note (Signed)
Addendum  created 09/17/15 1620 by Vista Deck, CRNA   Modules edited: Notes Section   Notes Section:  File: [id number]

## 2015-09-18 LAB — CBC
HCT: 25.6 % — ABNORMAL LOW (ref 36.0–46.0)
HEMOGLOBIN: 8.6 g/dL — AB (ref 12.0–15.0)
MCH: 30.1 pg (ref 26.0–34.0)
MCHC: 33.6 g/dL (ref 30.0–36.0)
MCV: 89.5 fL (ref 78.0–100.0)
Platelets: 304 10*3/uL (ref 150–400)
RBC: 2.86 MIL/uL — ABNORMAL LOW (ref 3.87–5.11)
RDW: 12.9 % (ref 11.5–15.5)
WBC: 16.4 10*3/uL — AB (ref 4.0–10.5)

## 2015-09-18 LAB — BASIC METABOLIC PANEL
ANION GAP: 9 (ref 5–15)
BUN: 26 mg/dL — ABNORMAL HIGH (ref 6–20)
CALCIUM: 8.1 mg/dL — AB (ref 8.9–10.3)
CO2: 25 mmol/L (ref 22–32)
Chloride: 100 mmol/L — ABNORMAL LOW (ref 101–111)
Creatinine, Ser: 1.5 mg/dL — ABNORMAL HIGH (ref 0.44–1.00)
GFR calc non Af Amer: 40 mL/min — ABNORMAL LOW (ref >60)
GFR, EST AFRICAN AMERICAN: 47 mL/min — AB (ref >60)
Glucose, Bld: 145 mg/dL — ABNORMAL HIGH (ref 65–99)
Potassium: 4.5 mmol/L (ref 3.5–5.1)
SODIUM: 134 mmol/L — AB (ref 135–145)

## 2015-09-18 LAB — PHOSPHORUS: PHOSPHORUS: 5.4 mg/dL — AB (ref 2.5–4.6)

## 2015-09-18 LAB — MAGNESIUM: MAGNESIUM: 1.3 mg/dL — AB (ref 1.7–2.4)

## 2015-09-18 MED ORDER — SODIUM CHLORIDE 0.9 % IV BOLUS (SEPSIS)
500.0000 mL | Freq: Once | INTRAVENOUS | Status: AC
  Administered 2015-09-18: 500 mL via INTRAVENOUS

## 2015-09-18 MED ORDER — MAGNESIUM SULFATE 2 GM/50ML IV SOLN
2.0000 g | Freq: Once | INTRAVENOUS | Status: AC
  Administered 2015-09-18: 2 g via INTRAVENOUS
  Filled 2015-09-18: qty 50

## 2015-09-18 NOTE — Progress Notes (Signed)
[facility] Day(s): 1.   Post op day(s): 1 Day Post-Op.   Interval History: Patient seen and examined, no acute events or new complaints overnight. Patient reports minimal pain has been well-controlled without nausea, vomiting, fever, chills, CP, SOB, palpitations, or lightheaded/dizzines despite transient post-operative hypotension and tachycardia at 5 pm yesterday, denies flatus.  Review of Systems:  Constitutional: denies fever, chills  HEENT: denies cough or congestion  Respiratory: denies any shortness of breath  Cardiovascular: denies chest pain or palpitations  Gastrointestinal: pain as per HPI, denies N/V, reports hicups yesterday (resolved)  Musculoskeletal: denies pain, decreased motor or sensation  Neurological: denies HA or vision/hearing changes   Vital signs in last 24 hours: [min-max] current  Temp:  [98 F (36.7 C)-98.9 F (37.2 C)] 98.5 F (36.9 C) (06/10 0737) Pulse Rate:  [80-126] 93 (06/10 0737) Resp:  [11-38] 20 (06/10 0737) BP: (72-163)/(55-87) 97/56 mmHg (06/10 0737) SpO2:  [95 %-100 %] 95 % (06/10 0737) Weight:  [71.2 kg (156 lb 15.5 oz)] 71.2 kg (156 lb 15.5 oz) (06/09 2114)     Height: 5\' 5"  (165.1 cm) Weight: 71.2 kg (156 lb 15.5 oz) BMI (Calculated): 26.2   Intake/Output this shift:      Intake/Output last 2 shifts:  @IOLAST2SHIFTS @   Physical Exam:  Constitutional: alert, cooperative and no distress  HENT: normocephalic without obvious abnormality  Eyes: PERRL, EOM's grossly intact and symmetric  Neuro: CN II - XII grossly intact and symmetric without deficit  Respiratory: breathing non-labored at rest  Cardiovascular: regular rate and sinus rhythm  Gastrointestinal: abdomen is soft with minimal peri-incisional tenderness, non-distended, dressing intact, incisions well-approximated without erythema or drainage Musculoskeletal: UE and LE FROM, no edema or wounds, motor and sensation grossly intact, NT   Labs:  CBC     Component Value Date/Time   WBC 16.4* 09/18/2015 0552   RBC 2.86* 09/18/2015 0552   HGB 8.6* 09/18/2015 0552   HCT 25.6* 09/18/2015 0552   PLT 304 09/18/2015 0552   MCV 89.5 09/18/2015 0552   MCH 30.1 09/18/2015 0552   MCHC 33.6 09/18/2015 0552   RDW 12.9 09/18/2015 0552   LYMPHSABS 1.4 09/13/2015 0820   MONOABS 0.4 09/13/2015 0820   EOSABS 0.2 09/13/2015 0820   BASOSABS 0.0 09/13/2015 0820   BMP:  Lab Results  Component Value Date   GLUCOSE 145* 09/18/2015   CO2 25 09/18/2015   BUN 26* 09/18/2015   CREATININE 1.50* 09/18/2015   CALCIUM 8.1* 09/18/2015     Imaging studies: No new pertinent imaging studies    Assessment/Plan:  50 y.o. 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   - Oral pain control prn  - Continue clear liquids diet for now  - Strict monitoring of In's/Out's (urine output)  - Do NOT remove Foley catheter in context of surgical bladder repair for colo-vesicular fistula  - BP slowly rising/recovering (97/56 now, 72/55 @ 5pm), HR slowly decreasing (93 now, 126 @ 5pm), UO okay  - 500 mL bolus given this morning, electrolytes replaced  - Ambulation encouraged, DVT prophylaxis   All of the above findings and recommendations were discussed with the patient, and all of patient's questions were answered to her expressed satisfaction.  -- Marilynne Drivers Rosana Hoes, [place]: [facility] Office: [phone]

## 2015-09-18 NOTE — Progress Notes (Signed)
2030 Patient passed large, intact blood clot via rectum. No active bleeding noted. Hgb 8.6 this AM. MD notified d/t patient request and concern.

## 2015-09-19 LAB — BASIC METABOLIC PANEL
ANION GAP: 4 — AB (ref 5–15)
Anion gap: 6 (ref 5–15)
BUN: 22 mg/dL — AB (ref 6–20)
BUN: 23 mg/dL — ABNORMAL HIGH (ref 6–20)
CALCIUM: 8.2 mg/dL — AB (ref 8.9–10.3)
CALCIUM: 8.2 mg/dL — AB (ref 8.9–10.3)
CO2: 29 mmol/L (ref 22–32)
CO2: 28 mmol/L (ref 22–32)
Chloride: 101 mmol/L (ref 101–111)
Chloride: 100 mmol/L — ABNORMAL LOW (ref 101–111)
Creatinine, Ser: 0.83 mg/dL (ref 0.44–1.00)
Creatinine, Ser: 0.78 mg/dL (ref 0.44–1.00)
GFR calc Af Amer: >60 mL/min (ref >60)
GLUCOSE: 133 mg/dL — AB (ref 65–99)
GLUCOSE: 128 mg/dL — AB (ref 65–99)
POTASSIUM: 4.4 mmol/L (ref 3.5–5.1)
Potassium: 4.5 mmol/L (ref 3.5–5.1)
Sodium: 134 mmol/L — ABNORMAL LOW (ref 135–145)
Sodium: 134 mmol/L — ABNORMAL LOW (ref 135–145)

## 2015-09-19 LAB — CBC
HCT: 19.9 % — ABNORMAL LOW (ref 36.0–46.0)
HCT: 19.3 % — ABNORMAL LOW (ref 36.0–46.0)
HCT: 23.8 % — ABNORMAL LOW (ref 36.0–46.0)
HEMOGLOBIN: 8.2 g/dL — AB (ref 12.0–15.0)
Hemoglobin: 6.8 g/dL — CL (ref 12.0–15.0)
Hemoglobin: 6.2 g/dL — CL (ref 12.0–15.0)
MCH: 30.5 pg (ref 26.0–34.0)
MCH: 29.7 pg (ref 26.0–34.0)
MCH: 30 pg (ref 26.0–34.0)
MCHC: 34.2 g/dL (ref 30.0–36.0)
MCHC: 32.1 g/dL (ref 30.0–36.0)
MCHC: 34.5 g/dL (ref 30.0–36.0)
MCV: 89.2 fL (ref 78.0–100.0)
MCV: 92.3 fL (ref 78.0–100.0)
MCV: 87.2 fL (ref 78.0–100.0)
PLATELETS: 295 10*3/uL (ref 150–400)
PLATELETS: 286 10*3/uL (ref 150–400)
PLATELETS: 219 10*3/uL (ref 150–400)
RBC: 2.23 MIL/uL — ABNORMAL LOW (ref 3.87–5.11)
RBC: 2.09 MIL/uL — ABNORMAL LOW (ref 3.87–5.11)
RBC: 2.73 MIL/uL — AB (ref 3.87–5.11)
RDW: 13.1 % (ref 11.5–15.5)
RDW: 12.6 % (ref 11.5–15.5)
RDW: 14.2 % (ref 11.5–15.5)
WBC: 21.4 10*3/uL — ABNORMAL HIGH (ref 4.0–10.5)
WBC: 21.3 10*3/uL — ABNORMAL HIGH (ref 4.0–10.5)
WBC: 15.1 10*3/uL — AB (ref 4.0–10.5)

## 2015-09-19 LAB — PREPARE RBC (CROSSMATCH)

## 2015-09-19 LAB — MAGNESIUM: Magnesium: 2.2 mg/dL (ref 1.7–2.4)

## 2015-09-19 LAB — PHOSPHORUS: Phosphorus: 3.3 mg/dL (ref 2.5–4.6)

## 2015-09-19 MED ORDER — KCL IN DEXTROSE-NACL 20-5-0.45 MEQ/L-%-% IV SOLN
INTRAVENOUS | Status: DC
  Administered 2015-09-19: 12:00:00 via INTRAVENOUS

## 2015-09-19 MED ORDER — HYDROMORPHONE HCL 1 MG/ML IJ SOLN: 1.0000 mg | INTRAMUSCULAR | Status: DC | PRN

## 2015-09-19 MED ORDER — SODIUM CHLORIDE 0.9 % IV SOLN
Freq: Once | INTRAVENOUS | Status: AC
  Administered 2015-09-19: 08:00:00 via INTRAVENOUS

## 2015-09-19 NOTE — Progress Notes (Signed)
CRITICAL VALUE ALERT  Critical value received:  Hgb 6.8  Date of notification:  09/19/15  Time of notification:  0223  Critical value read back:Yes.    Nurse who received alert:  Aldona Lento, RN  MD notified (1st page):  Dr.Jason Rosana Hoes   Time of first page:  0224  MD notified (2nd page):  Time of second page:  Responding MD:  [facility]  Time MD responded: [phone]  Patient stable at this time. MD waiting on 0500 lab draw to follow up on Hgb result.

## 2015-09-19 NOTE — Progress Notes (Signed)
CRITICAL VALUE ALERT  Critical value received:  Hgb 6.2  Date of notification:  09/19/15  Time of notification:  0552  Critical value read back:Yes.    Nurse who received alert:  Aldona Lento, RN  MD notified (1st page):  Dr.Jason Rosana Hoes   Time of first page:  [phone]  MD notified (2nd page):  Time of second page:  Responding MD:  Milana Na  Time MD responded:  [phone]  New Orders: Infuse 2 units PRBC

## 2015-09-19 NOTE — Progress Notes (Signed)
[facility] Hospital Day(s): 2.   Post op day(s): 2 Days Post-Op.   Interval History: Patient seen and examined with no new complaints overnight, reports pain remains well-controlled without nausea or flatus. She did pass a formed blood clot overnight, but no feces, has been ambulating without difficulty and tolerating sips of clear liquids. Patient denies fever/chills, CP, or SOB.  Review of Systems:  Constitutional: denies fever, chills  HEENT: denies cough or congestion  Respiratory: denies any shortness of breath  Cardiovascular: denies chest pain or palpitations  Gastrointestinal: denies N/V, diarrhea or constipation  Musculoskeletal: denies pain, decreased motor or sensation  Neurological: denies HA or vision/hearing changes   Vital signs in last 24 hours: [min-max] current  Temp:  [97.9 F (36.6 C)-98.8 F (37.1 C)] 98.3 F (36.8 C) (06/11 0650) Pulse Rate:  [72-122] 107 (06/11 0650) Resp:  [18-20] 18 (06/11 0650) BP: (104-131)/(50-75) 104/59 mmHg (06/11 0650) SpO2:  [95 %-98 %] 97 % (06/11 0650)     Height: 5\' 5"  (165.1 cm) Weight: 71.2 kg (156 lb 15.5 oz) BMI (Calculated): 26.2   Intake/Output this shift:      Intake/Output last 2 shifts:  Urine output 650 mL/12 hours and 1650 mL/24 hours  Physical Exam:  Constitutional: alert, cooperative and no distress  HENT: normocephalic without obvious abnormality  Eyes: PERRL, EOM's grossly intact and symmetric  Neuro: CN II - XII grossly intact and symmetric without deficit  Respiratory: breathing non-labored at rest  Cardiovascular: regular rate and sinus rhythm  Gastrointestinal: soft, appropriate peri-incisional tenderness, non-distended  Musculoskeletal: UE and LE FROM, no edema or wounds, motor and sensation grossly intact, NT   Labs:  CBC:  Lab Results  Component Value Date   WBC 21.3* 09/19/2015   RBC 2.09* 09/19/2015   BMP:  Lab Results  Component Value Date   GLUCOSE 128* 09/19/2015   CO2 28 09/19/2015   BUN 23* 09/19/2015   CREATININE 0.78 09/19/2015   CALCIUM 8.2* 09/19/2015    Imaging studies: No new pertinent imaging studies    Assessment/Plan:  50 y.o. 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  - Oral pain control prn - Continue clear liquids diet for now  - Transfusing 2 Units PRBC, check 6 pm CBC + am labs tomorrow - Do NOT remove Foley catheter in context of surgical bladder repair for colo-vesicular fistula - Strict monitoring of In's/Out's (urine output) - Ambulation encouraged, DVT prophylaxis   All of the above findings and recommendations were discussed with the patient, and all of patient's questions were answered to her expressed satisfaction.  -- Marilynne Drivers Rosana Hoes, [place]: [facility] Office: [phone]

## 2015-09-19 NOTE — Progress Notes (Signed)
[id number] Spoke with Dr.Jason Rosana Hoes regarding patient's Hgb 6.2 and new order given to transfuse 2 units PRBC. Lab called to verify type and screen complete and to make aware for the need for 2 units PRBC. Patient made aware and blood consent obtained.

## 2015-09-20 ENCOUNTER — Encounter (HOSPITAL_COMMUNITY): Payer: Self-pay | Admitting: General Surgery

## 2015-09-20 LAB — TYPE AND SCREEN
ABO/RH(D): O POS
ANTIBODY SCREEN: NEGATIVE
UNIT DIVISION: 0
Unit division: 0

## 2015-09-20 LAB — BASIC METABOLIC PANEL
Anion gap: 6 (ref 5–15)
BUN: 12 mg/dL (ref 6–20)
CALCIUM: 8.2 mg/dL — AB (ref 8.9–10.3)
CHLORIDE: 103 mmol/L (ref 101–111)
CO2: 26 mmol/L (ref 22–32)
Creatinine, Ser: 0.57 mg/dL (ref 0.44–1.00)
GFR calc Af Amer: >60 mL/min (ref >60)
Glucose, Bld: 91 mg/dL (ref 65–99)
POTASSIUM: 4.3 mmol/L (ref 3.5–5.1)
Sodium: 135 mmol/L (ref 135–145)

## 2015-09-20 LAB — CBC WITH DIFFERENTIAL/PLATELET
BASOS ABS: 0 10*3/uL (ref 0.0–0.1)
BASOS PCT: 0 %
Eosinophils Absolute: 0 10*3/uL (ref 0.0–0.7)
Eosinophils Relative: 0 %
HEMATOCRIT: 23.4 % — AB (ref 36.0–46.0)
HEMOGLOBIN: 8.1 g/dL — AB (ref 12.0–15.0)
Lymphocytes Relative: 18 %
Lymphs Abs: 2.2 10*3/uL (ref 0.7–4.0)
MCH: 30.6 pg (ref 26.0–34.0)
MCHC: 34.6 g/dL (ref 30.0–36.0)
MCV: 88.3 fL (ref 78.0–100.0)
Monocytes Absolute: 0.9 10*3/uL (ref 0.1–1.0)
Monocytes Relative: 8 %
NEUTROS ABS: 8.9 10*3/uL — AB (ref 1.7–7.7)
NEUTROS PCT: 74 %
Platelets: 142 10*3/uL — ABNORMAL LOW (ref 150–400)
RBC: 2.65 MIL/uL — ABNORMAL LOW (ref 3.87–5.11)
RDW: 14.4 % (ref 11.5–15.5)
WBC: 12 10*3/uL — AB (ref 4.0–10.5)

## 2015-09-20 NOTE — Progress Notes (Signed)
3 Days Post-Op  Subjective: Patient passed a small amount of blood per rectum. Tolerating full liquid diet well. Is ambulating without difficulty.  Objective: Vital signs in last 24 hours: Temp:  [97.9 F (36.6 C)-99 F (37.2 C)] 98.3 F (36.8 C) (06/12 0415) Pulse Rate:  [79-114] 79 (06/12 0415) Resp:  [16-20] 20 (06/12 0415) BP: (104-116)/(52-67) 110/66 mmHg (06/12 0415) SpO2:  [93 %-100 %] 93 % (06/12 0415) Last BM Date: 09/17/15  Intake/Output from previous day: 06/11 0701 - 06/12 0700 In: 2165 [P.O.:720; I.V.:785; Blood:660] Out: 1100 [Urine:1100] Intake/Output this shift:    General appearance: alert, cooperative and no distress Resp: clear to auscultation bilaterally Cardio: regular rate and rhythm, S1, S2 normal, no murmur, click, rub or gallop GI: Soft. Incision healing well. Bowel sounds active. No distention noted.  Lab Results:   Recent Labs  09/19/15 1921 09/20/15 0546  WBC 15.1* 12.0*  HGB 8.2* 8.1*  HCT 23.8* 23.4*  PLT 219 142*   BMET  Recent Labs  09/19/15 0443 09/20/15 0546  NA 134* 135  K 4.4 4.3  CL 100* 103  CO2 28 26  GLUCOSE 128* 91  BUN 23* 12  CREATININE 0.78 0.57  CALCIUM 8.2* 8.2*   PT/INR No results for input(s): LABPROT, INR in the last 72 hours.  Studies/Results: No results found.  Anti-infectives: Anti-infectives    Start     Dose/Rate Route Frequency Ordered Stop   09/17/15 0735  cefoTEtan in Dextrose 5% (CEFOTAN) IVPB 2 g     2 g Intravenous On call to O.R. 09/17/15 [id number] 09/17/15 0925      Assessment/Plan: s/p Procedure(s): PARTIAL COLECTOMY, REPAIR OF BLADDER FISTULA Impression: Hemoglobin stable. Bowel function starting to return.  Plan: We'll advance to full liquid diet. Continue ambulation. Will check CBC in a.m.  LOS: 3 days    Nandita Mathenia A 09/20/2015

## 2015-09-20 NOTE — Progress Notes (Signed)
Bowel movement noted, Entereg has been discontinued per policy. Pricilla Larsson, [facility] 09/20/2015 9:43 AM

## 2015-09-21 LAB — CBC
HEMATOCRIT: 25.5 % — AB (ref 36.0–46.0)
Hemoglobin: 8.5 g/dL — ABNORMAL LOW (ref 12.0–15.0)
MCH: 29.8 pg (ref 26.0–34.0)
MCHC: 33.3 g/dL (ref 30.0–36.0)
MCV: 89.5 fL (ref 78.0–100.0)
Platelets: 248 10*3/uL (ref 150–400)
RBC: 2.85 MIL/uL — AB (ref 3.87–5.11)
RDW: 14 % (ref 11.5–15.5)
WBC: 10 10*3/uL (ref 4.0–10.5)

## 2015-09-21 MED ORDER — OXYCODONE-ACETAMINOPHEN 5-325 MG PO TABS
1.0000 | ORAL_TABLET | ORAL | Status: DC | PRN
Start: 2015-09-21 — End: 2015-10-24

## 2015-09-21 NOTE — Discharge Summary (Signed)
Physician Discharge Summary  Patient ID: Sheila [name] MRN: [id number] DOB/AGE: April 12, 1965 50 y.o.  Admit date: 09/17/2015 Discharge date: 09/21/2015  Admission Diagnoses:Sigmoid diverticulitis, colovesical fistula  Discharge Diagnoses: Same Active Problems:   S/P partial colectomy   Discharged Condition: good  Hospital Course: Patient is a 51 year old white female who had been recently treated with antibiotics for sigmoid diverticulitis. There was a question of a colovesical fistula on CT scan. The patient subsequently underwent a partial colectomy with closure of colovesical fistula on 09/17/2015. She tolerated the procedure well. Her postoperative course was remarkable for acute surgical blood loss anemia requiring 2 units of packed red blood cells. Her diet was advanced without difficulty once her bowel function returned. Final pathology revealed sigmoid diverticulitis. There was no evidence of malignancy. As the repair of her bladder was superficial in nature, her Foley is being removed. She is being discharged home on 09/21/2015 in good and improving condition.  Treatments: surgery: Partial colectomy, repair of colovesical fistula on 09/17/2015  Discharge Exam: Blood pressure 135/61, pulse 97, temperature 98.4 F (36.9 C), temperature source Oral, resp. rate 20, height 5\' 5"  (1.651 m), weight 71.2 kg (156 lb 15.5 oz), last menstrual period 09/28/2014, SpO2 98 %. General appearance: alert, cooperative and no distress Head: Normocephalic, without obvious abnormality, atraumatic Neck: no adenopathy, no carotid bruit, no JVD and supple, symmetrical, trachea midline Resp: clear to auscultation bilaterally Cardio: regular rate and rhythm, S1, S2 normal, no murmur, click, rub or gallop GI: Soft, nondistended. Active bowel sounds appreciated. Incision healing well.  Disposition: 01-Home or Self Care     Medication List    STOP taking these medications        ciprofloxacin 500 MG  tablet  Commonly known as:  CIPRO     fluconazole 100 MG tablet  Commonly known as:  DIFLUCAN     HYDROcodone-acetaminophen 5-325 MG tablet  Commonly known as:  NORCO/VICODIN     OVER THE COUNTER MEDICATION     polyethylene glycol packet  Commonly known as:  MIRALAX      TAKE these medications        acetaminophen 500 MG tablet  Commonly known as:  TYLENOL  Take 1,000 mg by mouth every 6 (six) hours as needed for mild pain.     lisinopril 5 MG tablet  Commonly known as:  PRINIVIL,ZESTRIL  Take 5 mg by mouth daily.     LORazepam 0.5 MG tablet  Commonly known as:  ATIVAN  TAKE 1/2 TO 1 TABLET BY MOUTH AS NEEDED FOR SLEEP AND ANXIETY     metroNIDAZOLE 500 MG tablet  Commonly known as:  FLAGYL  Take 1 tablet (500 mg total) by mouth 3 (three) times daily.     omeprazole 20 MG tablet  Commonly known as:  PRILOSEC OTC  Take 20 mg by mouth daily.     oxyCODONE-acetaminophen 5-325 MG tablet  Commonly known as:  PERCOCET/ROXICET  Take 1-2 tablets by mouth every 4 (four) hours as needed for moderate pain.     PROBIOTIC DAILY PO  Take 1 capsule by mouth daily.     rizatriptan 10 MG tablet  Commonly known as:  MAXALT  Take 10 mg by mouth as needed for migraine. May repeat in 2 hours if needed           Follow-up Information    Follow up with Jamesetta So, MD. Schedule an appointment as soon as possible for a visit on 09/28/2015.   Specialty:  General  Surgery   Contact information:   [address] [id number] [phone]       Signed: Aviva Signs A 09/21/2015, 7:58 AM

## 2015-09-21 NOTE — Progress Notes (Signed)
Patient left floor in stable condition via w/c accompanied by nurse tech. Discharged home. Jalayia Bagheri, RN 

## 2015-09-21 NOTE — Progress Notes (Signed)
Discharge instructions reviewed with patient. Copy of AVS and prescription given. Discussed postop care. Pt verbalized understanding. IV Site d/c'd, site within normal limits. Follow-up appointment scheduled, pt verbalized understanding of when to call MD and follow-up. Pt voided independently this am after foley catheter d/c'd. Pt in stable condition awaiting father arrival to take her home. Donavan Foil, RN

## 2015-09-21 NOTE — Discharge Instructions (Signed)
Open Colectomy, Care After °Refer to this sheet in the next few weeks. These instructions provide you with information on caring for yourself after your procedure. Your health care provider may also give you more specific instructions. Your treatment has been planned according to current medical practices, but problems sometimes occur. Call your health care provider if you have any problems or questions after your procedure. °WHAT TO EXPECT AFTER THE PROCEDURE °After your procedure, it is typical to have the following: °· Pain in your abdomen, especially along your incision. You will be given medicines to control the pain. °· Tiredness. This is a normal part of the recovery process. Your energy level will return to normal over the next several weeks. °· Constipation. You may be given a stool softener to prevent this. °HOME CARE INSTRUCTIONS °· Only take over-the-counter or prescription medicines as directed by your health care provider. °· Ask your health care provider whether you may take a shower when you go home. °· You may resume a normal diet and activities as directed. Eat plenty of fruits and vegetables to help prevent constipation. °· Drink enough fluids to keep your urine clear or pale yellow. This also helps prevent constipation. °· Take rest breaks during the day as needed. °· Avoid lifting anything heavier than 25 pounds (11.3 kg) or driving for 4 weeks or until your health care provider says it is okay. °· Follow up with your health care provider as directed. Ask your health care provider when you need to return to have your stitches or staples removed. °SEEK MEDICAL CARE IF: °· You have redness, swelling, or increasing pain in the incision area. °· You see pus coming from the incision area. °· You have a fever. °SEEK IMMEDIATE MEDICAL CARE IF:  °· You have chest pain or shortness of breath. °· You have pain or swelling in your legs. °· You have persistent nausea and vomiting. °· Your wound breaks open  after stitches or staples have been removed. °· You have increasing abdominal pain that is not relieved with medicine. °  °This information is not intended to replace advice given to you by your health care provider. Make sure you discuss any questions you have with your health care provider. °  °Document Released: 10/18/2010 Document Revised: 01/15/2013 Document Reviewed: 11/06/2012 °Elsevier Interactive Patient Education ©2016 [employer] ° °

## 2015-10-05 ENCOUNTER — Other Ambulatory Visit (HOSPITAL_COMMUNITY)
Admission: RE | Admit: 2015-10-05 | Discharge: 2015-10-05 | Disposition: A | Discharge status: Home / Self Care | Payer: BLUE CROSS/BLUE SHIELD | Source: Other Acute Inpatient Hospital | Attending: Urology | Admitting: Urology

## 2015-10-05 ENCOUNTER — Ambulatory Visit (INDEPENDENT_AMBULATORY_CARE_PROVIDER_SITE_OTHER): Payer: BLUE CROSS/BLUE SHIELD | Admitting: Urology

## 2015-10-05 DIAGNOSIS — N3000 Acute cystitis without hematuria: Secondary | ICD-10-CM

## 2015-10-05 DIAGNOSIS — R300 Dysuria: Secondary | ICD-10-CM

## 2015-10-05 LAB — URINE MICROSCOPIC-ADD ON

## 2015-10-05 LAB — URINALYSIS, ROUTINE W REFLEX MICROSCOPIC
GLUCOSE, UA: NEGATIVE mg/dL
Ketones, ur: 15 mg/dL — AB
Nitrite: POSITIVE — AB
PH: 5.5 (ref 5.0–8.0)
SPECIFIC GRAVITY, URINE: 1.025 (ref 1.005–1.030)

## 2015-10-07 LAB — URINE CULTURE

## 2015-10-13 ENCOUNTER — Other Ambulatory Visit: Payer: Self-pay | Admitting: Family Medicine

## 2015-10-13 DIAGNOSIS — Z1231 Encounter for screening mammogram for malignant neoplasm of breast: Secondary | ICD-10-CM

## 2015-10-14 DIAGNOSIS — N3000 Acute cystitis without hematuria: Secondary | ICD-10-CM | POA: Diagnosis not present

## 2015-10-19 ENCOUNTER — Ambulatory Visit: Payer: BLUE CROSS/BLUE SHIELD

## 2015-10-19 ENCOUNTER — Other Ambulatory Visit: Payer: Self-pay | Admitting: Urology

## 2015-10-19 ENCOUNTER — Ambulatory Visit (INDEPENDENT_AMBULATORY_CARE_PROVIDER_SITE_OTHER): Payer: BLUE CROSS/BLUE SHIELD | Admitting: Urology

## 2015-10-19 ENCOUNTER — Ambulatory Visit (HOSPITAL_COMMUNITY)
Admission: RE | Admit: 2015-10-19 | Discharge: 2015-10-19 | Disposition: A | Discharge status: Home / Self Care | Payer: BLUE CROSS/BLUE SHIELD | Source: Ambulatory Visit | Attending: Urology | Admitting: Urology

## 2015-10-19 DIAGNOSIS — Z9889 Other specified postprocedural states: Secondary | ICD-10-CM | POA: Insufficient documentation

## 2015-10-19 DIAGNOSIS — R102 Pelvic and perineal pain: Secondary | ICD-10-CM

## 2015-10-19 DIAGNOSIS — R109 Unspecified abdominal pain: Secondary | ICD-10-CM

## 2015-10-19 DIAGNOSIS — N289 Disorder of kidney and ureter, unspecified: Secondary | ICD-10-CM | POA: Diagnosis not present

## 2015-10-19 MED ORDER — DIATRIZOATE MEGLUMINE & SODIUM 66-10 % PO SOLN
ORAL | Status: AC
  Filled 2015-10-19: qty 30

## 2015-10-19 MED ORDER — IOPAMIDOL (ISOVUE-300) INJECTION 61%
100.0000 mL | Freq: Once | INTRAVENOUS | Status: AC | PRN
  Administered 2015-10-19: 100 mL via INTRAVENOUS

## 2015-10-20 ENCOUNTER — Ambulatory Visit
Admission: RE | Admit: 2015-10-20 | Discharge: 2015-10-20 | Disposition: A | Discharge status: Home / Self Care | Payer: BLUE CROSS/BLUE SHIELD | Source: Ambulatory Visit | Attending: Family Medicine | Admitting: Family Medicine

## 2015-10-20 DIAGNOSIS — Z1231 Encounter for screening mammogram for malignant neoplasm of breast: Secondary | ICD-10-CM | POA: Diagnosis not present

## 2015-10-21 ENCOUNTER — Encounter (HOSPITAL_COMMUNITY): Payer: Self-pay

## 2015-10-21 ENCOUNTER — Inpatient Hospital Stay (HOSPITAL_COMMUNITY)
Admission: AD | Admit: 2015-10-21 | Discharge: 2015-10-24 | DRG: 759 | Disposition: A | Discharge status: Home / Self Care | Payer: BLUE CROSS/BLUE SHIELD | Source: Ambulatory Visit | Attending: General Surgery | Admitting: General Surgery

## 2015-10-21 DIAGNOSIS — I10 Essential (primary) hypertension: Secondary | ICD-10-CM | POA: Diagnosis not present

## 2015-10-21 DIAGNOSIS — N739 Female pelvic inflammatory disease, unspecified: Secondary | ICD-10-CM | POA: Diagnosis not present

## 2015-10-21 DIAGNOSIS — Z9049 Acquired absence of other specified parts of digestive tract: Secondary | ICD-10-CM | POA: Diagnosis not present

## 2015-10-21 DIAGNOSIS — K219 Gastro-esophageal reflux disease without esophagitis: Secondary | ICD-10-CM | POA: Diagnosis present

## 2015-10-21 DIAGNOSIS — T814XXA Infection following a procedure, initial encounter: Secondary | ICD-10-CM | POA: Diagnosis not present

## 2015-10-21 DIAGNOSIS — IMO0002 Reserved for concepts with insufficient information to code with codable children: Secondary | ICD-10-CM

## 2015-10-21 DIAGNOSIS — Z8249 Family history of ischemic heart disease and other diseases of the circulatory system: Secondary | ICD-10-CM

## 2015-10-21 LAB — BASIC METABOLIC PANEL
Anion gap: 4 — ABNORMAL LOW (ref 5–15)
BUN: 8 mg/dL (ref 6–20)
CHLORIDE: 103 mmol/L (ref 101–111)
CO2: 30 mmol/L (ref 22–32)
Calcium: 9.3 mg/dL (ref 8.9–10.3)
Creatinine, Ser: 0.54 mg/dL (ref 0.44–1.00)
GFR calc Af Amer: >60 mL/min (ref >60)
GFR calc non Af Amer: >60 mL/min (ref >60)
Glucose, Bld: 83 mg/dL (ref 65–99)
POTASSIUM: 3.9 mmol/L (ref 3.5–5.1)
SODIUM: 137 mmol/L (ref 135–145)

## 2015-10-21 LAB — CBC
HEMATOCRIT: 36 % (ref 36.0–46.0)
HEMOGLOBIN: 11.4 g/dL — AB (ref 12.0–15.0)
MCH: 27 pg (ref 26.0–34.0)
MCHC: 31.7 g/dL (ref 30.0–36.0)
MCV: 85.3 fL (ref 78.0–100.0)
Platelets: 433 10*3/uL — ABNORMAL HIGH (ref 150–400)
RBC: 4.22 MIL/uL (ref 3.87–5.11)
RDW: 13.8 % (ref 11.5–15.5)
WBC: 6.9 10*3/uL (ref 4.0–10.5)

## 2015-10-21 LAB — PROTIME-INR
INR: 0.93 (ref 0.00–1.49)
PROTHROMBIN TIME: 12.7 s (ref 11.6–15.2)

## 2015-10-21 LAB — PHOSPHORUS: PHOSPHORUS: 4.2 mg/dL (ref 2.5–4.6)

## 2015-10-21 LAB — MAGNESIUM: MAGNESIUM: 1.9 mg/dL (ref 1.7–2.4)

## 2015-10-21 MED ORDER — ACETAMINOPHEN 325 MG PO TABS: 650.0000 mg | ORAL_TABLET | Freq: Four times a day (QID) | ORAL | Status: DC | PRN

## 2015-10-21 MED ORDER — DIPHENHYDRAMINE HCL 50 MG/ML IJ SOLN: 25.0000 mg | Freq: Four times a day (QID) | INTRAMUSCULAR | Status: DC | PRN

## 2015-10-21 MED ORDER — LORAZEPAM 2 MG/ML IJ SOLN
1.0000 mg | INTRAMUSCULAR | Status: DC | PRN
  Administered 2015-10-21 – 2015-10-24 (×3): 1 mg via INTRAVENOUS
  Filled 2015-10-21 (×3): qty 1

## 2015-10-21 MED ORDER — SIMETHICONE 80 MG PO CHEW: 40.0000 mg | CHEWABLE_TABLET | Freq: Four times a day (QID) | ORAL | Status: DC | PRN

## 2015-10-21 MED ORDER — ONDANSETRON 4 MG PO TBDP: 4.0000 mg | ORAL_TABLET | Freq: Four times a day (QID) | ORAL | Status: DC | PRN

## 2015-10-21 MED ORDER — ACETAMINOPHEN 650 MG RE SUPP: 650.0000 mg | Freq: Four times a day (QID) | RECTAL | Status: DC | PRN

## 2015-10-21 MED ORDER — OXYCODONE-ACETAMINOPHEN 5-325 MG PO TABS
1.0000 | ORAL_TABLET | ORAL | Status: DC | PRN
  Administered 2015-10-21 – 2015-10-24 (×7): 2 via ORAL
  Filled 2015-10-21 (×7): qty 2

## 2015-10-21 MED ORDER — HYDROMORPHONE HCL 1 MG/ML IJ SOLN
1.0000 mg | INTRAMUSCULAR | Status: DC | PRN
  Administered 2015-10-21 – 2015-10-24 (×9): 1 mg via INTRAVENOUS
  Filled 2015-10-21 (×9): qty 1

## 2015-10-21 MED ORDER — ONDANSETRON HCL 4 MG/2ML IJ SOLN: 4.0000 mg | Freq: Four times a day (QID) | INTRAMUSCULAR | Status: DC | PRN

## 2015-10-21 MED ORDER — PANTOPRAZOLE SODIUM 40 MG PO TBEC
40.0000 mg | DELAYED_RELEASE_TABLET | Freq: Every day | ORAL | Status: DC
  Administered 2015-10-21 – 2015-10-24 (×3): 40 mg via ORAL
  Filled 2015-10-21 (×3): qty 1

## 2015-10-21 MED ORDER — DIPHENHYDRAMINE HCL 25 MG PO CAPS: 25.0000 mg | ORAL_CAPSULE | Freq: Four times a day (QID) | ORAL | Status: DC | PRN

## 2015-10-21 MED ORDER — PIPERACILLIN-TAZOBACTAM 3.375 G IVPB
3.3750 g | Freq: Three times a day (TID) | INTRAVENOUS | Status: DC
  Administered 2015-10-21 – 2015-10-24 (×8): 3.375 g via INTRAVENOUS
  Filled 2015-10-21 (×8): qty 50

## 2015-10-21 MED ORDER — SODIUM CHLORIDE 0.9 % IV SOLN
INTRAVENOUS | Status: DC
  Administered 2015-10-21 – 2015-10-23 (×2): via INTRAVENOUS

## 2015-10-21 NOTE — Progress Notes (Signed)
Patient ID: Sheila [name], female   DOB: 05/28/1965, 50 y.o.   MRN: [id number]   Request has been received for possible drain placement in IR Imaging has been reviewed by Dr Corrie Mckusick  Several collections 2 collections have safe window to access 1 collections is small but accessible, but with afebrile and normal wbc  Rec: continued antibiotics  If pt develops fever/increased pain or signs of infectious process-- Please re consult IR  May call Dr Earleen Newport at [phone] or [phone] pager if questions or concerns

## 2015-10-21 NOTE — H&P (Signed)
Sheila [name] is an 50 y.o. female.   Chief Complaint: Pelvic abscess HPI: Patient is a 50 year old white female status post partial colectomy with repair of bladder fistula on 09/17/2015 who has been having ongoing intermittent dysuria and bladder spasms. She has been seen by [facility] and they performed a CT scan which revealed multiple pelvic fluid collections around the bladder. No extravasation of contrast was noted from the rectum. No fistula was appreciated. She does have a small left renal cyst which will be addressed by [facility] at a future date. The patient is presenting to the hospital for IV antibiotics and interventional radiologic aspiration and drain placement of the abscesses. Patient denies any fever or chills. She has had lower abdominal discomfort for some time now.  Past Medical History  Diagnosis Date  . Diverticulitis   . Pelvic abscess in female     see [facility]  . Hypertension     not taking lisinopril now due to weight loss, PCP told her to hold for now  . Migraine headache     last one over 1 month ago.  Sheila [name] GERD (gastroesophageal reflux disease)     Past Surgical History  Procedure Laterality Date  . Colonoscopy  12/05/2010    left sided diverticulosis, melanosis coli. Next TCS at age 67. Procedure: COLONOSCOPY;  Surgeon: Daneil Dolin, MD;  Location: [facility];  Service: Endoscopy;  Laterality: N/A;  8:15AM  . Tonsillectomy      as child  . Cholecystectomy         . Dilation and curettage of uterus  02/06/2011    Procedure: DILATATION AND CURETTAGE (D&C);  Surgeon: Jonnie Kind, MD;  Location: [facility];  Service: Gynecology;  Laterality: N/A;  . Salpingoophorectomy  02/06/2011    Procedure: SALPINGO OOPHERECTOMY;  Surgeon: Jonnie Kind, MD;  Location: [facility];  Service: Gynecology;  Laterality: N/A;  procedure started at 1241  . Laparoscopy  02/06/2011    Procedure: LAPAROSCOPY DIAGNOSTIC;  Surgeon: Jonnie Kind, MD;  Location: [facility];   Service: Gynecology;  Laterality: N/A;  . Partial colectomy N/A 09/17/2015    Procedure: PARTIAL COLECTOMY, REPAIR OF BLADDER FISTULA;  Surgeon: Aviva Signs, MD;  Location: [facility];  Service: General;  Laterality: N/A;    Family History  Problem Relation Age of Onset  . Colon cancer Neg Hx   . Hypertension Father   . Hyperlipidemia Father    Social History:  reports that she has never smoked. She has never used smokeless tobacco. She reports that she does not drink alcohol or use illicit drugs.  Allergies:  Allergies  Allergen Reactions  . Lactose Intolerance (Gi)   . Sulfa Antibiotics Rash    Medications Prior to Admission  Medication Sig Dispense Refill  . acetaminophen (TYLENOL) 500 MG tablet Take 1,000 mg by mouth every 6 (six) hours as needed for mild pain.    Sheila [name] lisinopril (PRINIVIL,ZESTRIL) 5 MG tablet Take 5 mg by mouth daily.    Sheila [name] LORazepam (ATIVAN) 0.5 MG tablet TAKE 1/2 TO 1 TABLET BY MOUTH AS NEEDED FOR SLEEP AND ANXIETY  5  . metroNIDAZOLE (FLAGYL) 500 MG tablet Take 1 tablet (500 mg total) by mouth 3 (three) times daily. (Patient not taking: Reported on 09/13/2015) 30 tablet 0  . omeprazole (PRILOSEC OTC) 20 MG tablet Take 20 mg by mouth daily.    Sheila [name] oxyCODONE-acetaminophen (PERCOCET/ROXICET) 5-325 MG tablet Take 1-2 tablets by mouth every 4 (four) hours as needed for  moderate pain. 50 tablet 0  . Probiotic Product (PROBIOTIC DAILY PO) Take 1 capsule by mouth daily.    . rizatriptan (MAXALT) 10 MG tablet Take 10 mg by mouth as needed for migraine. May repeat in 2 hours if needed      No results found for this or any previous visit (from the past 48 hour(s)). Ct Abdomen Pelvis W Contrast  10/19/2015  CLINICAL DATA:  Diffuse abdominal and pelvic pain for several weeks. Urinary tract infection. Approximately 4 weeks postop from colon surgery for diverticulitis. EXAM: CT ABDOMEN AND PELVIS WITH CONTRAST TECHNIQUE: Multidetector CT imaging of the abdomen and pelvis was performed  using the standard protocol following bolus administration of intravenous contrast. CONTRAST:  168mL ISOVUE-300 IOPAMIDOL (ISOVUE-300) INJECTION 61% COMPARISON:  08/19/2015 FINDINGS: Lower chest:  No acute findings.  Tiny hiatal hernia again noted. Hepatobiliary: No masses or other significant abnormality. Prior cholecystectomy noted. No evidence of biliary dilatation. Pancreas: No mass, inflammatory changes, or other significant abnormality. Spleen: Within normal limits in size and appearance. Adrenals/Urinary Tract: Normal adrenal glands. 7 x 11 mm subcapsular high attenuation lesion is seen in the anterior midpole the left kidney on image 36 of series 2. This appears to show mild washout on delayed imaging, suspicious for contrast enhancement. This shows minimal increase in size since 2012 exam, and a small renal cell carcinoma cannot be excluded. No other renal masses identified. No evidence of hydronephrosis. Stomach/Bowel: Surgical staples now seen in mid sigmoid colon at site of recent bowel resection. Bowel wall thickening at this site is consistent with postop edema. No evidence of oral contrast extravasation at the anastomotic site. Multiple complex multiloculated fluid and gas collections with peripheral rim enhancement are seen in the pelvis. These are located in the right pelvis measuring 5.7 x 2.1 cm on image 65, the left pelvis measuring 5.1 x 7.3 cm on image 67, and the anterior pelvis just above the urinary bladder measuring 3.5 x 5.8 cm. Vascular/Lymphatic: Shotty sub-cm abdominal retroperitoneal lymph nodes and mesenteric lymph nodes are again seen, likely reactive in etiology. No evidence of abdominal aortic aneurysm. 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 Reproductive: Lobulated uterus again noted, consistent with presence of small fibroids. Other: None. Musculoskeletal:  No suspicious bone lesions identified. IMPRESSION:  Interval sigmoid colon resection. No evidence of anastomotic leak of oral contrast material. Three dominant fluid and gas collections in the pelvis, as described above, suspicious for multiple abscesses. 11 mm subcapsular lesion in the anterior midpole of the left kidney shows minimal enlargement since 2012 and possible contrast enhancement washout on delayed imaging. A small renal cell carcinoma cannot be excluded. Further characterization by abdomen MRI without and with contrast is recommended following resolution of the patient's current acute illness. Electronically Signed   By: Earle Gell M.D.   On: 10/19/2015 13:49   Mm Screening Breast Tomo Bilateral  10/20/2015  CLINICAL DATA:  Screening. EXAM: 2D DIGITAL SCREENING BILATERAL MAMMOGRAM WITH CAD AND ADJUNCT TOMO COMPARISON:  Previous exam(s). ACR Breast Density Category b: There are scattered areas of fibroglandular density. FINDINGS: There are no findings suspicious for malignancy. Images were processed with CAD. IMPRESSION: No mammographic evidence of malignancy. A result letter of this screening mammogram will be mailed directly to the patient. RECOMMENDATION: Screening mammogram in one year. (Code:SM-B-01Y) BI-RADS CATEGORY  1: Negative. Electronically Signed   By: Lajean Manes M.D.   On: 10/20/2015 14:21    Review  of Systems  Constitutional: Positive for malaise/fatigue. Negative for fever and chills.  HENT: Negative.   Eyes: Negative.   Respiratory: Negative.   Cardiovascular: Negative.   Gastrointestinal: Positive for abdominal pain. Negative for nausea and vomiting.  Genitourinary: Positive for dysuria and urgency.  Musculoskeletal: Negative.   Skin: Negative.   Neurological: Negative.   Endo/Heme/Allergies: Negative.     Blood pressure 110/63, pulse 88, temperature 98.6 F (37 C), temperature source Oral, resp. rate 20, height 5\' 5"  (1.651 m), weight 64.411 kg (142 lb), last menstrual period 09/28/2014, SpO2 100 %. Physical  Exam  Vitals reviewed. Constitutional: She is oriented to person, place, and time. She appears well-developed and well-nourished.  HENT:  Head: Normocephalic and atraumatic.  Neck: Normal range of motion. Neck supple.  Cardiovascular: Normal rate, regular rhythm and normal heart sounds.   Respiratory: Effort normal and breath sounds normal.  GI: Soft. She exhibits no distension. There is no rebound and no guarding.  Nonspecific discomfort to deep palpation in suprapubic region.  Musculoskeletal: Normal range of motion.  Neurological: She is alert and oriented to person, place, and time.  Skin: Skin is warm and dry.     Assessment/Plan Impression: Pelvic abscess, status post partial colectomy with repair bladder fistula. Plan: Patient be admitted to the hospital for IV antibiotics and interventional drainage of the abscesses. The risks and benefits of the procedure were fully explained to the patient, who gave informed consent.  Jamesetta So, MD 10/21/2015, 1:04 PM

## 2015-10-22 ENCOUNTER — Ambulatory Visit (HOSPITAL_COMMUNITY)
Admit: 2015-10-22 | Discharge: 2015-10-22 | Disposition: A | Discharge status: Home / Self Care | Payer: BLUE CROSS/BLUE SHIELD | Source: Home / Self Care | Attending: Radiology | Admitting: Radiology

## 2015-10-22 ENCOUNTER — Ambulatory Visit (HOSPITAL_COMMUNITY)
Admission: AD | Admit: 2015-10-22 | Discharge: 2015-10-22 | Disposition: A | Discharge status: Home / Self Care | Payer: BLUE CROSS/BLUE SHIELD | Source: Ambulatory Visit | Attending: General Surgery | Admitting: General Surgery

## 2015-10-22 DIAGNOSIS — K219 Gastro-esophageal reflux disease without esophagitis: Secondary | ICD-10-CM

## 2015-10-22 DIAGNOSIS — K651 Peritoneal abscess: Secondary | ICD-10-CM | POA: Insufficient documentation

## 2015-10-22 DIAGNOSIS — I10 Essential (primary) hypertension: Secondary | ICD-10-CM

## 2015-10-22 DIAGNOSIS — T814XXA Infection following a procedure, initial encounter: Secondary | ICD-10-CM | POA: Diagnosis not present

## 2015-10-22 DIAGNOSIS — Z9049 Acquired absence of other specified parts of digestive tract: Secondary | ICD-10-CM

## 2015-10-22 LAB — GLUCOSE, CAPILLARY
GLUCOSE-CAPILLARY: 76 mg/dL (ref 65–99)
GLUCOSE-CAPILLARY: 98 mg/dL (ref 65–99)

## 2015-10-22 MED ORDER — MIDAZOLAM HCL 2 MG/2ML IJ SOLN
INTRAMUSCULAR | Status: AC
  Filled 2015-10-22: qty 4

## 2015-10-22 MED ORDER — MIDAZOLAM HCL 2 MG/2ML IJ SOLN
INTRAMUSCULAR | Status: AC | PRN
  Administered 2015-10-22 (×3): 1 mg via INTRAVENOUS

## 2015-10-22 MED ORDER — LIDOCAINE HCL 1 % IJ SOLN
INTRAMUSCULAR | Status: AC
  Filled 2015-10-22: qty 20

## 2015-10-22 MED ORDER — FENTANYL CITRATE (PF) 100 MCG/2ML IJ SOLN
INTRAMUSCULAR | Status: AC
  Filled 2015-10-22: qty 2

## 2015-10-22 MED ORDER — HYDROMORPHONE HCL 1 MG/ML IJ SOLN
INTRAMUSCULAR | Status: AC
  Filled 2015-10-22: qty 1

## 2015-10-22 MED ORDER — FENTANYL CITRATE (PF) 100 MCG/2ML IJ SOLN
INTRAMUSCULAR | Status: AC | PRN
  Administered 2015-10-22 (×2): 50 ug via INTRAVENOUS

## 2015-10-22 MED ORDER — SODIUM CHLORIDE 0.9 % IV SOLN
INTRAVENOUS | Status: DC | PRN
  Administered 2015-10-22: 10 mL/h via INTRAVENOUS

## 2015-10-22 NOTE — Procedures (Signed)
Interventional Radiology Procedure Note  Procedure:  CT guided drainage of pelvic fluid collection  Complications:  None   Estimated Blood Loss: < 10 mL  Aspiration of collection superior to bladder yielded dark, old looking blood suggestive of hematoma.  Fluid sample sent for culture. 12 Fr drain placed in collection and attached to suction bulb.  Venetia Night. Kathlene Cote, M.D Pager:  [phone]

## 2015-10-22 NOTE — Sedation Documentation (Signed)
Patient is resting comfortably. 

## 2015-10-22 NOTE — Sedation Documentation (Signed)
JP bulb drain placed- old blood aspiarted. MD spoke to pt. RN will call [facility] and pts dtr for updates

## 2015-10-22 NOTE — Progress Notes (Signed)
Pt done with procedure. Awaiting transport back to [facility]. VS stable- awake and eating

## 2015-10-22 NOTE — Consult Note (Signed)
Chief Complaint: Patient was seen in consultation today for pelvic abscess drain at the request of Dr Aviva Signs  Referring Physician(s): Dr Aviva Signs  Supervising Physician: Aletta Edouard  Patient Status: Inpatient  History of Present Illness: Sheila [name] is a 50 y.o. female   Hx diverticulitis Hx flair and abscess 4 yrs ago- requiring drain placement Recent sigmoid colectomy with bladder fistula repair 5 weeks ago Re admitted with pelvic pain and fever CT 7/11: IMPRESSION: Interval sigmoid colon resection. No evidence of anastomotic leak of oral contrast material.  Three dominant fluid and gas collections in the pelvis, as described above, suspicious for multiple abscesses.  11 mm subcapsular lesion in the anterior midpole of the left kidney shows minimal enlargement since 2012 and possible contrast enhancement washout on delayed imaging. A small renal cell carcinoma cannot be excluded. Further characterization by abdomen MRI without and with contrast is recommended following resolution of the patient's current acute illness  Request per Dr Mickeal Needy for abscess drain placement Imaging reviewed with Dr Earleen Newport and Dr Valla Leaver procedure   Past Medical History  Diagnosis Date  . Diverticulitis   . Pelvic abscess in female     see PSH  . Hypertension     not taking lisinopril now due to weight loss, PCP told her to hold for now  . Migraine headache     last one over 1 month ago.  [name] GERD (gastroesophageal reflux disease)     Past Surgical History  Procedure Laterality Date  . Colonoscopy  12/05/2010    left sided diverticulosis, melanosis coli. Next TCS at age 42. Procedure: COLONOSCOPY;  Surgeon: Daneil Dolin, MD;  Location: [facility];  Service: Endoscopy;  Laterality: N/A;  8:15AM  . Tonsillectomy      as child  . Cholecystectomy         . Dilation and curettage of uterus  02/06/2011    Procedure: DILATATION AND CURETTAGE (D&C);   Surgeon: Jonnie Kind, MD;  Location: [facility];  Service: Gynecology;  Laterality: N/A;  . Salpingoophorectomy  02/06/2011    Procedure: SALPINGO OOPHERECTOMY;  Surgeon: Jonnie Kind, MD;  Location: [facility];  Service: Gynecology;  Laterality: N/A;  procedure started at 1241  . Laparoscopy  02/06/2011    Procedure: LAPAROSCOPY DIAGNOSTIC;  Surgeon: Jonnie Kind, MD;  Location: [facility];  Service: Gynecology;  Laterality: N/A;  . Partial colectomy N/A 09/17/2015    Procedure: PARTIAL COLECTOMY, REPAIR OF BLADDER FISTULA;  Surgeon: Aviva Signs, MD;  Location: [facility];  Service: General;  Laterality: N/A;    Allergies: Lactose intolerance (gi) and Sulfa antibiotics  Medications: Prior to Admission medications   Medication Sig Start Date End Date Taking? Authorizing Provider  acetaminophen (TYLENOL) 500 MG tablet Take 1,000 mg by mouth every 6 (six) hours as needed for mild pain.    Historical Provider, MD  LORazepam (ATIVAN) 0.5 MG tablet TAKE 1/2 TO 1 TABLET BY MOUTH AS NEEDED FOR SLEEP AND ANXIETY 06/29/15   Historical Provider, MD  omeprazole (PRILOSEC OTC) 20 MG tablet Take 20 mg by mouth daily.    Historical Provider, MD  oxyCODONE-acetaminophen (PERCOCET/ROXICET) 5-325 MG tablet Take 1-2 tablets by mouth every 4 (four) hours as needed for moderate pain. 09/21/15   Aviva Signs, MD  Probiotic Product (PROBIOTIC DAILY PO) Take 1 capsule by mouth daily.    Historical Provider, MD  rizatriptan (MAXALT) 10 MG tablet Take 10 mg by mouth as needed for  migraine. May repeat in 2 hours if needed    Historical Provider, MD     Family History  Problem Relation Age of Onset  . Colon cancer Neg Hx   . Hypertension Father   . Hyperlipidemia Father     Social History   Social History  . Marital Status: Divorced    Spouse Name: N/A  . Number of Children: 2  . Years of Education: N/A   Occupational History  . full-time     insurance    Social History Main Topics  . Smoking status: Never  Smoker   . Smokeless tobacco: Never Used  . Alcohol Use: No  . Drug Use: No  . Sexual Activity: Yes    Birth Control/ Protection: Post-menopausal   Other Topics Concern  . Not on file   Social History Narrative     Review of Systems: A 12 point ROS discussed and pertinent positives are indicated in the HPI above.  All other systems are negative.  Review of Systems  Constitutional: Positive for activity change. Negative for fever and fatigue.  Gastrointestinal: Positive for abdominal pain.  Genitourinary: Positive for pelvic pain.  Psychiatric/Behavioral: Negative for behavioral problems and confusion.    Vital Signs: LMP 09/28/2014  Physical Exam  Constitutional: She is oriented to person, place, and time.  Cardiovascular: Normal rate, regular rhythm and normal heart sounds.   Pulmonary/Chest: Effort normal and breath sounds normal.  Abdominal: Soft. Bowel sounds are normal. There is tenderness.  Musculoskeletal: Normal range of motion.  Neurological: She is alert and oriented to person, place, and time.  Skin: Skin is warm and dry.  Psychiatric: She has a normal mood and affect. Her behavior is normal. Judgment and thought content normal.  Nursing note and vitals reviewed.   Mallampati Score:  MD Evaluation Airway: WNL Heart: WNL Abdomen: WNL Chest/ Lungs: WNL ASA  Classification: 2 Mallampati/Airway Score: One  Imaging: Ct Abdomen Pelvis W Contrast  10/19/2015  CLINICAL DATA:  Diffuse abdominal and pelvic pain for several weeks. Urinary tract infection. Approximately 4 weeks postop from colon surgery for diverticulitis. EXAM: CT ABDOMEN AND PELVIS WITH CONTRAST TECHNIQUE: Multidetector CT imaging of the abdomen and pelvis was performed using the standard protocol following bolus administration of intravenous contrast. CONTRAST:  123mL ISOVUE-300 IOPAMIDOL (ISOVUE-300) INJECTION 61% COMPARISON:  08/19/2015 FINDINGS: Lower chest:  No acute findings.  Tiny hiatal  hernia again noted. Hepatobiliary: No masses or other significant abnormality. Prior cholecystectomy noted. No evidence of biliary dilatation. Pancreas: No mass, inflammatory changes, or other significant abnormality. Spleen: Within normal limits in size and appearance. Adrenals/Urinary Tract: Normal adrenal glands. 7 x 11 mm subcapsular high attenuation lesion is seen in the anterior midpole the left kidney on image 36 of series 2. This appears to show mild washout on delayed imaging, suspicious for contrast enhancement. This shows minimal increase in size since 2012 exam, and a small renal cell carcinoma cannot be excluded. No other renal masses identified. No evidence of hydronephrosis. Stomach/Bowel: Surgical staples now seen in mid sigmoid colon at site of recent bowel resection. Bowel wall thickening at this site is consistent with postop edema. No evidence of oral contrast extravasation at the anastomotic site. Multiple complex multiloculated fluid and gas collections with peripheral rim enhancement are seen in the pelvis. These are located in the right pelvis measuring 5.7 x 2.1 cm on image 65, the left pelvis measuring 5.1 x 7.3 cm on image 67, and the anterior pelvis just above the urinary  bladder measuring 3.5 x 5.8 cm. Vascular/Lymphatic: Shotty sub-cm abdominal retroperitoneal lymph nodes and mesenteric lymph nodes are again seen, likely reactive in etiology. No evidence of abdominal aortic aneurysm. 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 Reproductive: Lobulated uterus again noted, consistent with presence of small fibroids. Other: None. Musculoskeletal:  No suspicious bone lesions identified. IMPRESSION: Interval sigmoid colon resection. No evidence of anastomotic leak of oral contrast material. Three dominant fluid and gas collections in the pelvis, as described above, suspicious for multiple abscesses. 11 mm subcapsular lesion in the  anterior midpole of the left kidney shows minimal enlargement since 2012 and possible contrast enhancement washout on delayed imaging. A small renal cell carcinoma cannot be excluded. Further characterization by abdomen MRI without and with contrast is recommended following resolution of the patient's current acute illness. Electronically Signed   By: Earle Gell M.D.   On: 10/19/2015 13:49   Mm Screening Breast Tomo Bilateral  10/20/2015  CLINICAL DATA:  Screening. EXAM: 2D DIGITAL SCREENING BILATERAL MAMMOGRAM WITH CAD AND ADJUNCT TOMO COMPARISON:  Previous exam(s). ACR Breast Density Category b: There are scattered areas of fibroglandular density. FINDINGS: There are no findings suspicious for malignancy. Images were processed with CAD. IMPRESSION: No mammographic evidence of malignancy. A result letter of this screening mammogram will be mailed directly to the patient. RECOMMENDATION: Screening mammogram in one year. (Code:SM-B-01Y) BI-RADS CATEGORY  1: Negative. Electronically Signed   By: Lajean Manes M.D.   On: 10/20/2015 14:21    Labs:  CBC:  Recent Labs  09/19/15 1921 09/20/15 0546 09/21/15 0539 10/21/15 1322  WBC 15.1* 12.0* 10.0 6.9  HGB 8.2* 8.1* 8.5* 11.4*  HCT 23.8* 23.4* 25.5* 36.0  PLT 219 142* 248 433*    COAGS:  Recent Labs  07/31/15 1354 10/21/15 1322  INR 1.08 0.93  APTT 36  --     BMP:  Recent Labs  09/19/15 0116 09/19/15 0443 09/20/15 0546 10/21/15 1322  NA 134* 134* 135 137  K 4.5 4.4 4.3 3.9  CL 101 100* 103 103  CO2 29 28 26 30   GLUCOSE 133* 128* 91 83  BUN 22* 23* 12 8  CALCIUM 8.2* 8.2* 8.2* 9.3  CREATININE 0.83 0.78 0.57 0.54  GFRNONAA >60 >60 >60 >60  GFRAA >60 >60 >60 >60    LIVER FUNCTION TESTS:  Recent Labs  07/31/15 1354  BILITOT 0.9  AST 20  ALT 30  ALKPHOS 89  PROT 9.1*  ALBUMIN 4.4    TUMOR MARKERS: No results for input(s): AFPTM, CEA, CA199, CHROMGRNA in the last 8760 hours.  Assessment and Plan:  Partial  colectomy with bladder fistula repair 09/17/2015 Now with pelvic abscess Scheduled for abscess drain placement Risks and Benefits discussed with the patient including bleeding, infection, damage to adjacent structures, bowel perforation/fistula connection, and sepsis. All of the patient's questions were answered, patient is agreeable to proceed. Consent signed and in chart.   Thank you for this interesting consult.  I greatly enjoyed meeting Sheila [name] and look forward to participating in their care.  A copy of this report was sent to the requesting provider on this date.  Electronically Signed: Kennedie Pardoe A 10/22/2015, 1:09 PM   I spent a total of 40 Minutes    in face to face in clinical consultation, greater than 50% of which was counseling/coordinating care for pelvic abscess drain

## 2015-10-22 NOTE — Progress Notes (Signed)
Patient ID: Sheila [name], female   DOB: Jan 14, 1966, 50 y.o.   MRN: [id number]   Re order for abscess drain in place Dr Arnoldo Morale and Dr Earleen Newport have discussed case Dr Earleen Newport agreeable to proceed  IR PA will call RN with time and arrangements for procedure asap.

## 2015-10-22 NOTE — Progress Notes (Signed)
  Subjective: Patient comfortable. Still with pressure sensation on bladder.  Objective: Vital signs in last 24 hours: Temp:  [97.4 F (36.3 C)-98.7 F (37.1 C)] 97.4 F (36.3 C) (07/14 0400) Pulse Rate:  [79-90] 79 (07/14 0400) Resp:  [20] 20 (07/14 0400) BP: (92-117)/(57-70) 105/65 mmHg (07/14 0400) SpO2:  [97 %-100 %] 100 % (07/14 0400) Weight:  [64.411 kg (142 lb)] 64.411 kg (142 lb) (07/13 1230) Last BM Date: 10/20/15  Intake/Output from previous day: 07/13 0701 - 07/14 0700 In: 480 [P.O.:480] Out: -  Intake/Output this shift:    General appearance: alert, cooperative and no distress Resp: clear to auscultation bilaterally Cardio: regular rate and rhythm, S1, S2 normal, no murmur, click, rub or gallop GI: soft, non-tender; bowel sounds normal; no masses,  no organomegaly  Lab Results:   Recent Labs  10/21/15 1322  WBC 6.9  HGB 11.4*  HCT 36.0  PLT 433*   BMET  Recent Labs  10/21/15 1322  NA 137  K 3.9  CL 103  CO2 30  GLUCOSE 83  BUN 8  CREATININE 0.54  CALCIUM 9.3   PT/INR  Recent Labs  10/21/15 1322  LABPROT 12.7  INR 0.93    Studies/Results: No results found.  Anti-infectives: Anti-infectives    Start     Dose/Rate Route Frequency Ordered Stop   10/21/15 1400  piperacillin-tazobactam (ZOSYN) IVPB 3.375 g     3.375 g 12.5 mL/hr over 240 Minutes Intravenous Every 8 hours 10/21/15 1312        Assessment/Plan: Impression: Pelvic fluid collections, bladder spasms. Plan: For IR drainage/aspiration of fluid collections today.  LOS: 1 day    Artelia Game A 10/22/2015

## 2015-10-23 NOTE — Progress Notes (Signed)
Approximately 10 cc of thick brown fluid emptied from drain.

## 2015-10-23 NOTE — Progress Notes (Signed)
Subjective: Doing well. Minimal pain at drain site. States her lower abdominal discomfort with urinating seems to be better.  Objective: Vital signs in last 24 hours: Temp:  [97.8 F (36.6 C)-98 F (36.7 C)] 97.8 F (36.6 C) (07/15 0539) Pulse Rate:  [70-92] 70 (07/15 0539) Resp:  [14-20] 18 (07/15 0539) BP: (93-123)/(47-84) 112/62 mmHg (07/15 0539) SpO2:  [93 %-100 %] 99 % (07/15 0539) Last BM Date: 10/21/15  Intake/Output from previous day: 07/14 0701 - 07/15 0700 In: 0  Out: 25  Intake/Output this shift:    General appearance: alert, cooperative and no distress Resp: clear to auscultation bilaterally Cardio: regular rate and rhythm, S1, S2 normal, no murmur, click, rub or gallop GI: Soft, nontender, nondistended. Catheter drainage sanguinous in nature.  Lab Results:   Recent Labs  10/21/15 1322  WBC 6.9  HGB 11.4*  HCT 36.0  PLT 433*   BMET  Recent Labs  10/21/15 1322  NA 137  K 3.9  CL 103  CO2 30  GLUCOSE 83  BUN 8  CREATININE 0.54  CALCIUM 9.3   PT/INR  Recent Labs  10/21/15 1322  LABPROT 12.7  INR 0.93    Studies/Results: Ct Image Guided Drainage By Percutaneous Catheter  10/22/2015  CLINICAL DATA:  Status post sigmoid colectomy 4 weeks ago to treat sigmoid diverticulitis. Closure of colovesical fistula at that time. Pelvic pain, urinary tract infection and voiding symptoms. CT has demonstrated pelvic fluid collections. EXAM: CT GUIDED DRAINAGE OF PELVIC PERITONEAL ABSCESS ANESTHESIA/SEDATION: 3.0 Mg IV Versed 100 mcg IV Fentanyl Total Moderate Sedation Time:  15 minutes. The patient's level of consciousness and physiologic status were continuously monitored during the procedure by Radiology nursing. PROCEDURE: The procedure, risks, benefits, and alternatives were explained to the patient. Questions regarding the procedure were encouraged and answered. The patient understands and consents to the procedure. A time-out was performed prior to the  procedure. The lower anterior abdominal wall was prepped with chlorhexidine in a sterile fashion, and a sterile drape was applied covering the operative field. A sterile gown and sterile gloves were used for the procedure. Local anesthesia was provided with 1% Lidocaine. CT was performed through the pelvis in a supine position. Under CT guidance, an 18 gauge trocar needle was advanced to the level of an anterior fluid collection from an approach to the right of midline. Aspiration was performed through the needle. A fluid sample was sent for culture analysis. A guidewire was advanced into the collection. The tract was dilated and a 12 French percutaneous drainage catheter placed. This catheter was flushed with saline and connected to a suction bulb. The catheter was secured at the skin with a Prolene retention suture and StatLock device. COMPLICATIONS: None FINDINGS: The anterior fluid collection just superior to the bladder is the only area amenable to percutaneous drainage of the fluid collections seen on the prior CT. Needle aspiration yielded dark, old appearing blood. A drain was placed and is draining dark bloody fluid. This drain will be irrigated with saline flushes. IMPRESSION: CT-guided percutaneous drainage of pelvic abscess located in the anterior pelvis and immediately superior to the bladder. Aspiration yielded dark, old appearing liquefied blood. A sample was sent for culture analysis. A 12 French drain was placed. Electronically Signed   By: Aletta Edouard M.D.   On: 10/22/2015 17:27    Anti-infectives: Anti-infectives    Start     Dose/Rate Route Frequency Ordered Stop   10/21/15 1400  piperacillin-tazobactam (ZOSYN) IVPB 3.375 g  3.375 g 12.5 mL/hr over 240 Minutes Intravenous Every 8 hours 10/21/15 1312        Assessment/Plan: Impression: Doing well, status post drainage of apparent hematoma. Cultures are pending. Plan: Continue IV antibiotics. Anticipate discharge in next 24-48  hours.  LOS: 2 days    Meisha Salone A 10/23/2015

## 2015-10-24 MED ORDER — OXYCODONE-ACETAMINOPHEN 5-325 MG PO TABS: 1.0000 | ORAL_TABLET | ORAL | Status: DC | PRN

## 2015-10-24 MED ORDER — AMOXICILLIN-POT CLAVULANATE 875-125 MG PO TABS: 1.0000 | ORAL_TABLET | Freq: Two times a day (BID) | ORAL | Status: DC

## 2015-10-24 MED ORDER — METRONIDAZOLE 250 MG PO TABS: 250.0000 mg | ORAL_TABLET | Freq: Three times a day (TID) | ORAL | Status: DC

## 2015-10-24 NOTE — Discharge Instructions (Signed)
Bulb Drain Home Care A bulb drain consists of a thin rubber tube and a soft, round bulb that creates a gentle suction. The rubber tube is placed in the area where you had surgery. A bulb is attached to the end of the tube that is outside the body. The bulb drain removes excess fluid that normally builds up in a surgical wound after surgery. The color and amount of fluid will vary. Immediately after surgery, the fluid is bright red and is a little thicker than water. It may gradually change to a yellow or pink color and become more thin and water-like. When the amount decreases to about 1 or 2 tbsp in 24 hours, your health care provider will usually remove it. DAILY CARE  Keep the bulb flat (compressed) at all times, except while emptying it. The flatness creates suction. You can flatten the bulb by squeezing it firmly in the middle and then closing the cap.  Keep sites where the tube enters the skin dry and covered with a bandage (dressing).  Secure the tube 1-2 in (2.5-5.1 cm) below the insertion sites to keep it from pulling on your stitches. The tube is stitched in place and will not slip out.  Secure the bulb as directed by your health care provider.  For the first 3 days after surgery, there usually is more fluid in the bulb. Empty the bulb whenever it becomes half full because the bulb does not create enough suction if it is too full. The bulb could also overflow. Write down how much fluid you remove each time you empty your drain. Add up the amount removed in 24 hours.  Empty the bulb at the same time every day once the amount of fluid decreases and you only need to empty it once a day. Write down the amounts and the 24-hour totals to give to your health care provider. This helps your health care provider know when the tubes can be removed. EMPTYING THE BULB DRAIN Before emptying the bulb, get a measuring cup, a piece of paper and a pen, and wash your hands.  Gently run your fingers down the  tube (stripping) to empty any drainage from the tubing into the bulb. This may need to be done several times a day to clear the tubing of clots and tissue.  Open the bulb cap to release suction, which causes it to inflate. Do not touch the inside of the cap.  Gently run your fingers down the tube (stripping) to empty any drainage from the tubing into the bulb.  Hold the cap out of the way, and pour fluid into the measuring cup.   Squeeze the bulb to provide suction.  Replace the cap.   Check the tape that holds the tube to your skin. If it is becoming loose, you can remove the loose piece of tape and apply a new one. Then, pin the bulb to your shirt.   Write down the amount of fluid you emptied out. Write down the date and each time you emptied your bulb drain. (If there are 2 bulbs, note the amount of drainage from each bulb and keep the totals separate. Your health care provider will want to know the total amounts for each drain and which tube is draining more.)   Flush the fluid down the toilet and wash your hands.   Call your health care provider once you have less than 2 tbsp of fluid collecting in the bulb drain every 24 hours. If  there is drainage around the tube site, change dressings and keep the area dry. Cleanse around tube with sterile saline and place dry gauze around site. This gauze should be changed when it is soiled. If it stays clean and unsoiled, it should still be changed daily.  SEEK MEDICAL CARE IF:  Your drainage has a bad smell or is cloudy.   You have a fever.   Your drainage is increasing instead of decreasing.   Your tube fell out.   You have redness or swelling around the tube site.   You have drainage from a surgical wound.   Your bulb drain will not stay flat after you empty it.  MAKE SURE YOU:   Understand these instructions.  Will watch your condition.  Will get help right away if you are not doing well or get worse.   This  information is not intended to replace advice given to you by your health care provider. Make sure you discuss any questions you have with your health care provider.   Document Released: 03/24/2000 Document Revised: 04/17/2014 Document Reviewed: 10/14/2014 Elsevier Interactive Patient Education [employer].

## 2015-10-24 NOTE — Discharge Summary (Signed)
Physician Discharge Summary  Patient ID: Sheila [name] MRN: [id number] DOB/AGE: 1965-06-14 50 y.o.  Admit date: 10/21/2015 Discharge date: 10/24/2015  Admission Diagnoses:Intra-abdominal fluid collection, dysuria  Discharge Diagnoses: Same Active Problems:   Pelvic abscess in female   Discharged Condition: good  Hospital Course: Patient is a 50 year old white female status post partial colectomy with repair bladder fistula on 09/17/2015 for diverticulitis who has been followed by  urology and myself for recurrent episodes of dysuria. A CT scan was performed which revealed multiple lower abdominal and pelvic fluid collections. She was brought into the hospital for further evaluation treatment and IV antibiotics. Her white blood cell count the time of admission was normal. She underwent CT-guided percutaneous drainage of the fluid collection over the dome of the bladder on 10/22/2015 which revealed old blood present. Initial Gram stain reveals rare gram-negative rods and gram-positive cocci. Final culture results are pending. Patient states she did have an episode of premature you the following day, but that has since resolved. The patient is being discharged home on 10/24/2015 in good and improving condition. The drain will be left in place.  Discharge Exam: Blood pressure 116/69, pulse 99, temperature 98.8 F (37.1 C), temperature source Oral, resp. rate 20, height 5\' 5"  (1.651 m), weight 64.411 kg (142 lb), last menstrual period 09/28/2014, SpO2 97 %. General appearance: alert, cooperative and no distress Head: Normocephalic, without obvious abnormality, atraumatic Resp: clear to auscultation bilaterally Cardio: regular rate and rhythm, S1, S2 normal, no murmur, click, rub or gallop GI: soft, non-tender; bowel sounds normal; no masses,  no organomegaly and Small caliber drain in place with bulb.  Disposition: 01-Home or Self Care     Medication List    TAKE these medications        acetaminophen 500 MG tablet  Commonly known as:  TYLENOL  Take 1,000 mg by mouth every 6 (six) hours as needed for mild pain.     amoxicillin-clavulanate 875-125 MG tablet  Commonly known as:  AUGMENTIN  Take 1 tablet by mouth 2 (two) times daily.     LORazepam 0.5 MG tablet  Commonly known as:  ATIVAN  TAKE 1/2 TO 1 TABLET BY MOUTH AS NEEDED FOR SLEEP AND ANXIETY     metroNIDAZOLE 250 MG tablet  Commonly known as:  FLAGYL  Take 1 tablet (250 mg total) by mouth 3 (three) times daily.     omeprazole 20 MG tablet  Commonly known as:  PRILOSEC OTC  Take 20 mg by mouth daily.     oxyCODONE-acetaminophen 5-325 MG tablet  Commonly known as:  PERCOCET/ROXICET  Take 1-2 tablets by mouth every 4 (four) hours as needed for moderate pain.     PROBIOTIC DAILY PO  Take 1 capsule by mouth daily.     rizatriptan 10 MG tablet  Commonly known as:  MAXALT  Take 10 mg by mouth as needed for migraine. May repeat in 2 hours if needed           Follow-up Information    Follow up with Jamesetta So, MD. Schedule an appointment as soon as possible for a visit on 10/28/2015.   Specialty:  General Surgery   Contact information:   [id number] [phone]       Signed: Aviva Signs A 10/24/2015, 12:17 PM

## 2015-10-24 NOTE — Progress Notes (Signed)
Pt discharged home today per [name].  Pt's IV site D/C'd and WDL.  Pt's VSS.  Pt provided with home medication list, discharge instructions and prescriptions.  Verbalized understanding.

## 2015-10-26 LAB — AEROBIC/ANAEROBIC CULTURE W GRAM STAIN (SURGICAL/DEEP WOUND)

## 2015-10-26 LAB — AEROBIC/ANAEROBIC CULTURE (SURGICAL/DEEP WOUND): SPECIAL REQUESTS: NORMAL

## 2015-12-20 DIAGNOSIS — G43109 Migraine with aura, not intractable, without status migrainosus: Secondary | ICD-10-CM | POA: Diagnosis not present

## 2015-12-20 DIAGNOSIS — Z0000 Encounter for general adult medical examination without abnormal findings: Secondary | ICD-10-CM | POA: Diagnosis not present

## 2015-12-20 DIAGNOSIS — I10 Essential (primary) hypertension: Secondary | ICD-10-CM | POA: Diagnosis not present

## 2015-12-20 DIAGNOSIS — Z23 Encounter for immunization: Secondary | ICD-10-CM | POA: Diagnosis not present

## 2015-12-20 DIAGNOSIS — G479 Sleep disorder, unspecified: Secondary | ICD-10-CM | POA: Diagnosis not present

## 2015-12-28 ENCOUNTER — Ambulatory Visit (INDEPENDENT_AMBULATORY_CARE_PROVIDER_SITE_OTHER): Payer: BLUE CROSS/BLUE SHIELD | Admitting: Urology

## 2015-12-28 ENCOUNTER — Other Ambulatory Visit (HOSPITAL_COMMUNITY)
Admission: RE | Admit: 2015-12-28 | Discharge: 2015-12-28 | Disposition: A | Discharge status: Home / Self Care | Payer: BLUE CROSS/BLUE SHIELD | Source: Ambulatory Visit | Attending: Family Medicine | Admitting: Family Medicine

## 2015-12-28 DIAGNOSIS — R102 Pelvic and perineal pain: Secondary | ICD-10-CM | POA: Insufficient documentation

## 2015-12-28 DIAGNOSIS — D3002 Benign neoplasm of left kidney: Secondary | ICD-10-CM | POA: Diagnosis not present

## 2015-12-28 DIAGNOSIS — R300 Dysuria: Secondary | ICD-10-CM | POA: Diagnosis not present

## 2015-12-29 ENCOUNTER — Other Ambulatory Visit: Payer: Self-pay | Admitting: Urology

## 2015-12-29 DIAGNOSIS — D3002 Benign neoplasm of left kidney: Secondary | ICD-10-CM

## 2015-12-31 LAB — URINE CULTURE

## 2016-01-03 ENCOUNTER — Ambulatory Visit (HOSPITAL_COMMUNITY)
Admission: RE | Admit: 2016-01-03 | Discharge: 2016-01-03 | Disposition: A | Discharge status: Home / Self Care | Payer: BLUE CROSS/BLUE SHIELD | Source: Ambulatory Visit | Attending: Urology | Admitting: Urology

## 2016-01-03 DIAGNOSIS — K429 Umbilical hernia without obstruction or gangrene: Secondary | ICD-10-CM | POA: Insufficient documentation

## 2016-01-03 DIAGNOSIS — N289 Disorder of kidney and ureter, unspecified: Secondary | ICD-10-CM | POA: Diagnosis not present

## 2016-01-03 DIAGNOSIS — I722 Aneurysm of renal artery: Secondary | ICD-10-CM | POA: Diagnosis not present

## 2016-01-03 DIAGNOSIS — N1330 Unspecified hydronephrosis: Secondary | ICD-10-CM | POA: Diagnosis not present

## 2016-01-03 DIAGNOSIS — Q453 Other congenital malformations of pancreas and pancreatic duct: Secondary | ICD-10-CM | POA: Insufficient documentation

## 2016-01-03 DIAGNOSIS — D3002 Benign neoplasm of left kidney: Secondary | ICD-10-CM | POA: Diagnosis not present

## 2016-01-03 MED ORDER — GADOBENATE DIMEGLUMINE 529 MG/ML IV SOLN
15.0000 mL | Freq: Once | INTRAVENOUS | Status: AC | PRN
  Administered 2016-01-03: 13 mL via INTRAVENOUS

## 2016-02-01 DIAGNOSIS — G479 Sleep disorder, unspecified: Secondary | ICD-10-CM | POA: Diagnosis not present

## 2016-02-01 DIAGNOSIS — F419 Anxiety disorder, unspecified: Secondary | ICD-10-CM | POA: Diagnosis not present

## 2016-02-08 ENCOUNTER — Ambulatory Visit (INDEPENDENT_AMBULATORY_CARE_PROVIDER_SITE_OTHER): Payer: BLUE CROSS/BLUE SHIELD | Admitting: Urology

## 2016-02-08 DIAGNOSIS — N3000 Acute cystitis without hematuria: Secondary | ICD-10-CM | POA: Diagnosis not present

## 2016-02-08 DIAGNOSIS — D3002 Benign neoplasm of left kidney: Secondary | ICD-10-CM | POA: Diagnosis not present

## 2016-02-29 DIAGNOSIS — H25093 Other age-related incipient cataract, bilateral: Secondary | ICD-10-CM | POA: Diagnosis not present

## 2016-02-29 DIAGNOSIS — H1032 Unspecified acute conjunctivitis, left eye: Secondary | ICD-10-CM | POA: Diagnosis not present

## 2016-03-07 DIAGNOSIS — H25093 Other age-related incipient cataract, bilateral: Secondary | ICD-10-CM | POA: Diagnosis not present

## 2016-03-07 DIAGNOSIS — H1032 Unspecified acute conjunctivitis, left eye: Secondary | ICD-10-CM | POA: Diagnosis not present

## 2016-03-14 DIAGNOSIS — G43109 Migraine with aura, not intractable, without status migrainosus: Secondary | ICD-10-CM | POA: Diagnosis not present

## 2016-03-14 DIAGNOSIS — I10 Essential (primary) hypertension: Secondary | ICD-10-CM | POA: Diagnosis not present

## 2016-03-14 DIAGNOSIS — F418 Other specified anxiety disorders: Secondary | ICD-10-CM | POA: Diagnosis not present

## 2016-03-14 DIAGNOSIS — G479 Sleep disorder, unspecified: Secondary | ICD-10-CM | POA: Diagnosis not present

## 2016-03-29 DIAGNOSIS — H401230 Low-tension glaucoma, bilateral, stage unspecified: Secondary | ICD-10-CM | POA: Diagnosis not present

## 2016-03-30 ENCOUNTER — Encounter: Payer: Self-pay | Admitting: Vascular Surgery

## 2016-03-30 DIAGNOSIS — H04123 Dry eye syndrome of bilateral lacrimal glands: Secondary | ICD-10-CM | POA: Diagnosis not present

## 2016-04-07 DIAGNOSIS — J069 Acute upper respiratory infection, unspecified: Secondary | ICD-10-CM | POA: Diagnosis not present

## 2016-04-07 DIAGNOSIS — R05 Cough: Secondary | ICD-10-CM | POA: Diagnosis not present

## 2016-04-11 ENCOUNTER — Ambulatory Visit (INDEPENDENT_AMBULATORY_CARE_PROVIDER_SITE_OTHER): Payer: BLUE CROSS/BLUE SHIELD | Admitting: Vascular Surgery

## 2016-04-11 ENCOUNTER — Encounter: Payer: Self-pay | Admitting: Vascular Surgery

## 2016-04-11 VITALS — BP 155/95 | HR 77 | Temp 97.6°F | Resp 16 | Ht 65.0 in | Wt 159.0 lb

## 2016-04-11 DIAGNOSIS — I722 Aneurysm of renal artery: Secondary | ICD-10-CM

## 2016-04-11 NOTE — Progress Notes (Signed)
[facility] [place]  Patient name: Sheila [name] MRN: [id number] DOB: 17-May-1965 Sex: female  REASON FOR CONSULT: Left renal artery aneurysm  HPI: Sheila [name] is a 51 y.o. female, who is seen today for discussion of left renal artery aneurysm. She is very pleasant female who had this discovered as an incidental finding following pelvic surgery for diverticulitis. She was found to have a 12 mm aneurysm in her left renal artery and also a cyst on her left kidney. Subsequent MRI in September showed this was probably a cyst and one year follow-up with ultrasound was recommended regarding the lesion on her kidney. The MRI showed no change in her renal artery aneurysm over a short 2 months interval. She does not have any history of extreme hypertension. Initially was on oral antihypertensive but came off this medication when she had some weight loss. She currently is not on antihypertensives. She has recovered from her colon surgery with the diverticulitis.  Past Medical History:  Diagnosis Date  . Diverticulitis   . GERD (gastroesophageal reflux disease)   . Hypertension    not taking lisinopril now due to weight loss, PCP told her to hold for now  . Migraine headache    last one over 1 month ago.  [name] Pelvic abscess in female    see PSH    Family History  Problem Relation Age of Onset  . Colon cancer Neg Hx   . Hypertension Father   . Hyperlipidemia Father     SOCIAL HISTORY: Social History   Social History  . Marital status: Divorced    Spouse name: N/A  . Number of children: 2  . Years of education: N/A   Occupational History  . full-time     insurance    Social History Main Topics  . Smoking status: Never Smoker  . Smokeless tobacco: Never Used  . Alcohol use No  . Drug use: No  . Sexual activity: Yes    Birth control/ protection: Post-menopausal   Other Topics Concern  . Not on file   Social History  Narrative  . No narrative on file    Allergies  Allergen Reactions  . Lactose Intolerance (Gi)   . Sulfa Antibiotics Rash    Current Outpatient Prescriptions  Medication Sig Dispense Refill  . LORazepam (ATIVAN) 0.5 MG tablet TAKE 1/2 TO 1 TABLET BY MOUTH AS NEEDED FOR SLEEP AND ANXIETY  5  . omeprazole (PRILOSEC OTC) 20 MG tablet Take 20 mg by mouth daily.    . Probiotic Product (PROBIOTIC DAILY PO) Take 1 capsule by mouth daily.    . rizatriptan (MAXALT) 10 MG tablet Take 10 mg by mouth as needed for migraine. May repeat in 2 hours if needed    . acetaminophen (TYLENOL) 500 MG tablet Take 1,000 mg by mouth every 6 (six) hours as needed for mild pain.    [name] amoxicillin-clavulanate (AUGMENTIN) 875-125 MG tablet Take 1 tablet by mouth 2 (two) times daily. (Patient not taking: Reported on 04/11/2016) 28 tablet 0  . metroNIDAZOLE (FLAGYL) 250 MG tablet Take 1 tablet (250 mg total) by mouth 3 (three) times daily. (Patient not taking: Reported on 04/11/2016) 42 tablet 0  . oxyCODONE-acetaminophen (PERCOCET/ROXICET) 5-325 MG tablet Take 1-2 tablets by mouth every 4 (four) hours as needed for moderate pain. (Patient not taking: Reported on 04/11/2016) 40 tablet 0   No current facility-administered medications for this visit.     REVIEW OF SYSTEMS:  [X]  denotes  positive finding, [ ]  denotes negative finding Cardiac  Comments:  Chest pain or chest pressure:    Shortness of breath upon exertion:    Short of breath when lying flat:    Irregular heart rhythm:        Vascular    Pain in calf, thigh, or hip brought on by ambulation:    Pain in feet at night that wakes you up from your sleep:     Blood clot in your veins:    Leg swelling:         Pulmonary    Oxygen at home:    Productive cough:     Wheezing:         Neurologic    Sudden weakness in arms or legs:     Sudden numbness in arms or legs:     Sudden onset of difficulty speaking or slurred speech:    Temporary loss of vision in one  eye:     Problems with dizziness:         Gastrointestinal    Blood in stool:     Vomited blood:         Genitourinary    Burning when urinating:     Blood in urine:        Psychiatric    Major depression:         Hematologic    Bleeding problems:    Problems with blood clotting too easily:        Skin    Rashes or ulcers:        Constitutional    Fever or chills:      PHYSICAL EXAM: Vitals:   04/11/16 1008 04/11/16 1011  BP: (!) 153/98 (!) 155/95  Pulse: 77   Resp: 16   Temp: 97.6 F (36.4 C)   TempSrc: Oral   SpO2: 99%   Weight: 159 lb (72.1 kg)   Height: 5\' 5"  (1.651 m)     GENERAL: The patient is a well-nourished female, in no acute distress. The vital signs are documented above. CARDIOVASCULAR: 2+ radial, 2+ femoral and 2+ dorsalis pedis pulses bilaterally. Carotid arteries without bruits bilaterally PULMONARY: There is good air exchange  ABDOMEN: Soft and non-tender . No masses noted. No bruits noted MUSCULOSKELETAL: There are no major deformities or cyanosis. NEUROLOGIC: No focal weakness or paresthesias are detected. SKIN: There are no ulcers or rashes noted. PSYCHIATRIC: The patient has a normal affect.  DATA:  I reviewed her CT scan from July 2017 and her MRI from September 2017. This does show a renal artery aneurysm which is partially calcified in the hilum of her left kidney. No other vascular abnormalities  MEDICAL ISSUES: Had long discussion with patient regarding the incidental finding of a 1.2 cm aneurysm in her left renal artery hilum. Explained this would be quite difficult to repair. Explained that this would require either nephrectomy if it enlarged or potential bench repair of her branch renal artery vessels. Explained that this would not be offered in [place]. Have recommended serial CT scan follow-up to rule out enlargement. Explain all likelihood this will remain stable. I did discuss symptoms of leaking renal artery aneurysm and she  knows to report immediately to the emergency room should this occur. We will see her again in one year with repeat CT scan. This will place her proximally 18 months out from her last CT scan.   Rosetta Posner, MD FACS [facility] Tel [phone] Pager [phone]

## 2016-05-15 DIAGNOSIS — F419 Anxiety disorder, unspecified: Secondary | ICD-10-CM | POA: Diagnosis not present

## 2016-05-15 DIAGNOSIS — I10 Essential (primary) hypertension: Secondary | ICD-10-CM | POA: Diagnosis not present

## 2016-06-01 ENCOUNTER — Other Ambulatory Visit: Payer: Self-pay | Admitting: Urology

## 2016-06-01 DIAGNOSIS — D3002 Benign neoplasm of left kidney: Secondary | ICD-10-CM

## 2016-06-12 ENCOUNTER — Ambulatory Visit (HOSPITAL_COMMUNITY)
Admission: RE | Admit: 2016-06-12 | Discharge: 2016-06-12 | Disposition: A | Discharge status: Home / Self Care | Payer: BLUE CROSS/BLUE SHIELD | Source: Ambulatory Visit | Attending: Urology | Admitting: Urology

## 2016-06-12 DIAGNOSIS — D3002 Benign neoplasm of left kidney: Secondary | ICD-10-CM | POA: Diagnosis not present

## 2016-06-12 DIAGNOSIS — N289 Disorder of kidney and ureter, unspecified: Secondary | ICD-10-CM | POA: Insufficient documentation

## 2016-06-12 DIAGNOSIS — C642 Malignant neoplasm of left kidney, except renal pelvis: Secondary | ICD-10-CM | POA: Diagnosis not present

## 2016-08-17 DIAGNOSIS — L600 Ingrowing nail: Secondary | ICD-10-CM | POA: Diagnosis not present

## 2016-08-17 DIAGNOSIS — I10 Essential (primary) hypertension: Secondary | ICD-10-CM | POA: Diagnosis not present

## 2016-08-17 DIAGNOSIS — L03031 Cellulitis of right toe: Secondary | ICD-10-CM | POA: Diagnosis not present

## 2016-08-17 DIAGNOSIS — F419 Anxiety disorder, unspecified: Secondary | ICD-10-CM | POA: Diagnosis not present

## 2016-10-16 DIAGNOSIS — R109 Unspecified abdominal pain: Secondary | ICD-10-CM | POA: Diagnosis not present

## 2016-10-16 DIAGNOSIS — F411 Generalized anxiety disorder: Secondary | ICD-10-CM | POA: Diagnosis not present

## 2016-10-16 DIAGNOSIS — I10 Essential (primary) hypertension: Secondary | ICD-10-CM | POA: Diagnosis not present

## 2016-10-16 DIAGNOSIS — K219 Gastro-esophageal reflux disease without esophagitis: Secondary | ICD-10-CM | POA: Diagnosis not present

## 2016-10-17 ENCOUNTER — Ambulatory Visit (INDEPENDENT_AMBULATORY_CARE_PROVIDER_SITE_OTHER): Payer: BLUE CROSS/BLUE SHIELD | Admitting: Urology

## 2016-10-17 DIAGNOSIS — D3002 Benign neoplasm of left kidney: Secondary | ICD-10-CM | POA: Diagnosis not present

## 2016-10-19 ENCOUNTER — Other Ambulatory Visit: Payer: Self-pay | Admitting: Family Medicine

## 2016-10-19 DIAGNOSIS — Z1231 Encounter for screening mammogram for malignant neoplasm of breast: Secondary | ICD-10-CM

## 2016-10-20 ENCOUNTER — Ambulatory Visit
Admission: RE | Admit: 2016-10-20 | Discharge: 2016-10-20 | Disposition: A | Discharge status: Home / Self Care | Payer: BLUE CROSS/BLUE SHIELD | Source: Ambulatory Visit | Attending: Family Medicine | Admitting: Family Medicine

## 2016-10-20 DIAGNOSIS — Z1231 Encounter for screening mammogram for malignant neoplasm of breast: Secondary | ICD-10-CM

## 2016-11-17 DIAGNOSIS — F411 Generalized anxiety disorder: Secondary | ICD-10-CM | POA: Diagnosis not present

## 2016-11-17 DIAGNOSIS — I10 Essential (primary) hypertension: Secondary | ICD-10-CM | POA: Diagnosis not present

## 2016-12-13 DIAGNOSIS — M9901 Segmental and somatic dysfunction of cervical region: Secondary | ICD-10-CM | POA: Diagnosis not present

## 2016-12-14 DIAGNOSIS — M9901 Segmental and somatic dysfunction of cervical region: Secondary | ICD-10-CM | POA: Diagnosis not present

## 2016-12-15 ENCOUNTER — Other Ambulatory Visit (HOSPITAL_COMMUNITY)
Admission: RE | Admit: 2016-12-15 | Discharge: 2016-12-15 | Disposition: A | Discharge status: Home / Self Care | Payer: BLUE CROSS/BLUE SHIELD | Source: Ambulatory Visit | Attending: Family Medicine | Admitting: Family Medicine

## 2016-12-15 ENCOUNTER — Other Ambulatory Visit: Payer: Self-pay | Admitting: Family Medicine

## 2016-12-15 DIAGNOSIS — F418 Other specified anxiety disorders: Secondary | ICD-10-CM | POA: Diagnosis not present

## 2016-12-15 DIAGNOSIS — G43109 Migraine with aura, not intractable, without status migrainosus: Secondary | ICD-10-CM | POA: Diagnosis not present

## 2016-12-15 DIAGNOSIS — G479 Sleep disorder, unspecified: Secondary | ICD-10-CM | POA: Diagnosis not present

## 2016-12-15 DIAGNOSIS — Z0000 Encounter for general adult medical examination without abnormal findings: Secondary | ICD-10-CM | POA: Diagnosis not present

## 2016-12-15 DIAGNOSIS — Z124 Encounter for screening for malignant neoplasm of cervix: Secondary | ICD-10-CM | POA: Insufficient documentation

## 2016-12-15 DIAGNOSIS — I10 Essential (primary) hypertension: Secondary | ICD-10-CM | POA: Diagnosis not present

## 2016-12-15 DIAGNOSIS — Z23 Encounter for immunization: Secondary | ICD-10-CM | POA: Diagnosis not present

## 2016-12-15 DIAGNOSIS — E559 Vitamin D deficiency, unspecified: Secondary | ICD-10-CM | POA: Diagnosis not present

## 2016-12-15 DIAGNOSIS — Z1321 Encounter for screening for nutritional disorder: Secondary | ICD-10-CM | POA: Diagnosis not present

## 2016-12-18 DIAGNOSIS — M9901 Segmental and somatic dysfunction of cervical region: Secondary | ICD-10-CM | POA: Diagnosis not present

## 2016-12-19 DIAGNOSIS — M9901 Segmental and somatic dysfunction of cervical region: Secondary | ICD-10-CM | POA: Diagnosis not present

## 2016-12-19 LAB — CYTOLOGY - PAP: DIAGNOSIS: NEGATIVE

## 2016-12-21 DIAGNOSIS — M9901 Segmental and somatic dysfunction of cervical region: Secondary | ICD-10-CM | POA: Diagnosis not present

## 2016-12-25 DIAGNOSIS — M9901 Segmental and somatic dysfunction of cervical region: Secondary | ICD-10-CM | POA: Diagnosis not present

## 2016-12-27 DIAGNOSIS — M9901 Segmental and somatic dysfunction of cervical region: Secondary | ICD-10-CM | POA: Diagnosis not present

## 2016-12-28 DIAGNOSIS — M9901 Segmental and somatic dysfunction of cervical region: Secondary | ICD-10-CM | POA: Diagnosis not present

## 2017-01-01 DIAGNOSIS — M9901 Segmental and somatic dysfunction of cervical region: Secondary | ICD-10-CM | POA: Diagnosis not present

## 2017-01-03 DIAGNOSIS — M9901 Segmental and somatic dysfunction of cervical region: Secondary | ICD-10-CM | POA: Diagnosis not present

## 2017-01-04 DIAGNOSIS — M9901 Segmental and somatic dysfunction of cervical region: Secondary | ICD-10-CM | POA: Diagnosis not present

## 2017-01-08 DIAGNOSIS — M9901 Segmental and somatic dysfunction of cervical region: Secondary | ICD-10-CM | POA: Diagnosis not present

## 2017-01-11 DIAGNOSIS — M9901 Segmental and somatic dysfunction of cervical region: Secondary | ICD-10-CM | POA: Diagnosis not present

## 2017-01-15 DIAGNOSIS — M9901 Segmental and somatic dysfunction of cervical region: Secondary | ICD-10-CM | POA: Diagnosis not present

## 2017-01-18 DIAGNOSIS — M9901 Segmental and somatic dysfunction of cervical region: Secondary | ICD-10-CM | POA: Diagnosis not present

## 2017-01-22 DIAGNOSIS — M9901 Segmental and somatic dysfunction of cervical region: Secondary | ICD-10-CM | POA: Diagnosis not present

## 2017-01-25 DIAGNOSIS — M9901 Segmental and somatic dysfunction of cervical region: Secondary | ICD-10-CM | POA: Diagnosis not present

## 2017-01-30 DIAGNOSIS — M9901 Segmental and somatic dysfunction of cervical region: Secondary | ICD-10-CM | POA: Diagnosis not present

## 2017-02-01 DIAGNOSIS — M9901 Segmental and somatic dysfunction of cervical region: Secondary | ICD-10-CM | POA: Diagnosis not present

## 2017-02-06 DIAGNOSIS — M9901 Segmental and somatic dysfunction of cervical region: Secondary | ICD-10-CM | POA: Diagnosis not present

## 2017-02-12 DIAGNOSIS — M9901 Segmental and somatic dysfunction of cervical region: Secondary | ICD-10-CM | POA: Diagnosis not present

## 2017-02-20 DIAGNOSIS — M9901 Segmental and somatic dysfunction of cervical region: Secondary | ICD-10-CM | POA: Diagnosis not present

## 2017-02-26 ENCOUNTER — Other Ambulatory Visit: Payer: Self-pay

## 2017-02-26 DIAGNOSIS — I722 Aneurysm of renal artery: Secondary | ICD-10-CM

## 2017-02-27 DIAGNOSIS — M9901 Segmental and somatic dysfunction of cervical region: Secondary | ICD-10-CM | POA: Diagnosis not present

## 2017-03-06 DIAGNOSIS — M9901 Segmental and somatic dysfunction of cervical region: Secondary | ICD-10-CM | POA: Diagnosis not present

## 2017-03-13 DIAGNOSIS — M9901 Segmental and somatic dysfunction of cervical region: Secondary | ICD-10-CM | POA: Diagnosis not present

## 2017-03-27 DIAGNOSIS — M9901 Segmental and somatic dysfunction of cervical region: Secondary | ICD-10-CM | POA: Diagnosis not present

## 2017-04-05 DIAGNOSIS — M9901 Segmental and somatic dysfunction of cervical region: Secondary | ICD-10-CM | POA: Diagnosis not present

## 2017-04-17 ENCOUNTER — Ambulatory Visit (INDEPENDENT_AMBULATORY_CARE_PROVIDER_SITE_OTHER): Payer: BLUE CROSS/BLUE SHIELD | Admitting: Vascular Surgery

## 2017-04-17 ENCOUNTER — Other Ambulatory Visit: Payer: BLUE CROSS/BLUE SHIELD

## 2017-04-17 ENCOUNTER — Ambulatory Visit
Admission: RE | Admit: 2017-04-17 | Discharge: 2017-04-17 | Disposition: A | Discharge status: Home / Self Care | Payer: BLUE CROSS/BLUE SHIELD | Source: Ambulatory Visit | Attending: Vascular Surgery | Admitting: Vascular Surgery

## 2017-04-17 ENCOUNTER — Encounter: Payer: Self-pay | Admitting: Vascular Surgery

## 2017-04-17 VITALS — BP 112/79 | HR 74 | Temp 98.5°F | Resp 16 | Ht 65.0 in | Wt 176.0 lb

## 2017-04-17 DIAGNOSIS — I722 Aneurysm of renal artery: Secondary | ICD-10-CM

## 2017-04-17 DIAGNOSIS — K449 Diaphragmatic hernia without obstruction or gangrene: Secondary | ICD-10-CM | POA: Diagnosis not present

## 2017-04-17 MED ORDER — IOPAMIDOL (ISOVUE-370) INJECTION 76%
75.0000 mL | Freq: Once | INTRAVENOUS | Status: AC | PRN
  Administered 2017-04-17: 75 mL via INTRAVENOUS

## 2017-04-17 NOTE — Progress Notes (Signed)
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 female follow-up.  She reports no new symptoms since her last she is here today for CT follow-up of her incidental finding of left renal artery aneurysm.  She denies any new symptoms from her diverticular disease or other health issues.  She does have hypertension which is controlled with medication  Past Medical History:  Diagnosis Date  . Diverticulitis   . GERD (gastroesophageal reflux disease)   . Hypertension    not taking lisinopril now due to weight loss, PCP told her to hold for now  . Migraine headache    last one over 1 month ago.  [name] Pelvic abscess in female    see PSH    Family History  Problem Relation Age of Onset  . Hypertension Father   . Hyperlipidemia Father   . Colon cancer Neg Hx     SOCIAL HISTORY: Social History   Tobacco Use  . Smoking status: Never Smoker  . Smokeless tobacco: Never Used  Substance Use Topics  . Alcohol use: No    Allergies  Allergen Reactions  . Lactose Intolerance (Gi)   . Sulfa Antibiotics Rash    Current Outpatient Medications  Medication Sig Dispense Refill  . citalopram (CELEXA) 40 MG tablet Take 40 mg by mouth daily.    [name] lisinopril (PRINIVIL,ZESTRIL) 10 MG tablet Take 10 mg by mouth daily.    [name] omeprazole (PRILOSEC OTC) 20 MG tablet Take 20 mg by mouth daily.    . Probiotic Product (PROBIOTIC DAILY PO) Take 1 capsule by mouth daily.    . rizatriptan (MAXALT) 10 MG tablet Take 10 mg by mouth as needed for migraine. May repeat in 2 hours if needed    . zolpidem (AMBIEN) 5 MG tablet Take 5 mg by mouth at bedtime as needed for sleep.    [name] acetaminophen (TYLENOL) 500 MG tablet Take 1,000 mg by mouth every 6 (six) hours as needed for mild pain.     No current facility-administered  medications for this visit.     REVIEW OF SYSTEMS:  [X]  denotes positive finding, [ ]  denotes negative finding Cardiac  Comments:  Chest pain or chest pressure:    Shortness of breath upon exertion:    Short of breath when lying flat:    Irregular heart rhythm:        Vascular    Pain in calf, thigh, or hip brought on by ambulation:    Pain in feet at night that wakes you up from your sleep:     Blood clot in your veins:    Leg swelling:           PHYSICAL EXAM: Vitals:   04/17/17 0914  BP: 112/79  Pulse: 74  Resp: 16  Temp: 98.5 F (36.9 C)  TempSrc: Oral  SpO2: 100%  Weight: 176 lb (79.8 kg)  Height: 5\' 5"  (1.651 m)    GENERAL: The patient is a well-nourished female, in no acute distress. The vital signs are documented above. CARDIOVASCULAR: 2+ radial pulses.  Carotid arteries without bruits by abdomen is soft and nontender with no bruits PULMONARY: There is good air exchange  MUSCULOSKELETAL: There are no major deformities or cyanosis. NEUROLOGIC: No focal weakness or paresthesias are detected. SKIN: There are no ulcers or rashes noted. PSYCHIATRIC: The  patient has a normal affect.  DATA:  CT scan reveals no change hilar aneurysm.  This is in her left renal artery.  Incidental findings of a small umbilical hernia and hiatal hernia were these were communicated to the patient as well  MEDICAL ISSUES: Stable follow-up now after several years regarding renal artery aneurysm with no significant growth.  Have recommended that we drop back to every 2-year CT surveillance.  Again discussed symptoms of leaking renal artery aneurysm explained this would be highly she is comfortable with this discussion will see [place] again in 2 years    Rosetta Posner, MD FACS [facility] Tel [phone] Pager [phone]

## 2017-05-07 DIAGNOSIS — J101 Influenza due to other identified influenza virus with other respiratory manifestations: Secondary | ICD-10-CM | POA: Diagnosis not present

## 2017-05-07 DIAGNOSIS — J209 Acute bronchitis, unspecified: Secondary | ICD-10-CM | POA: Diagnosis not present

## 2017-05-07 DIAGNOSIS — R509 Fever, unspecified: Secondary | ICD-10-CM | POA: Diagnosis not present

## 2017-07-01 DIAGNOSIS — H40033 Anatomical narrow angle, bilateral: Secondary | ICD-10-CM | POA: Diagnosis not present

## 2017-07-01 DIAGNOSIS — G44219 Episodic tension-type headache, not intractable: Secondary | ICD-10-CM | POA: Diagnosis not present

## 2017-07-06 DIAGNOSIS — H1013 Acute atopic conjunctivitis, bilateral: Secondary | ICD-10-CM | POA: Diagnosis not present

## 2017-07-16 DIAGNOSIS — H04123 Dry eye syndrome of bilateral lacrimal glands: Secondary | ICD-10-CM | POA: Diagnosis not present

## 2017-08-21 DIAGNOSIS — Z1283 Encounter for screening for malignant neoplasm of skin: Secondary | ICD-10-CM | POA: Diagnosis not present

## 2017-08-21 DIAGNOSIS — X32XXXA Exposure to sunlight, initial encounter: Secondary | ICD-10-CM | POA: Diagnosis not present

## 2017-08-21 DIAGNOSIS — L570 Actinic keratosis: Secondary | ICD-10-CM | POA: Diagnosis not present

## 2017-08-21 DIAGNOSIS — D225 Melanocytic nevi of trunk: Secondary | ICD-10-CM | POA: Diagnosis not present

## 2017-09-18 ENCOUNTER — Other Ambulatory Visit: Payer: Self-pay | Admitting: Family Medicine

## 2017-09-18 DIAGNOSIS — Z1231 Encounter for screening mammogram for malignant neoplasm of breast: Secondary | ICD-10-CM

## 2017-10-15 ENCOUNTER — Encounter: Payer: Self-pay | Admitting: Internal Medicine

## 2017-10-16 ENCOUNTER — Other Ambulatory Visit: Payer: Self-pay | Admitting: Urology

## 2017-10-16 DIAGNOSIS — D3002 Benign neoplasm of left kidney: Secondary | ICD-10-CM

## 2017-10-22 ENCOUNTER — Ambulatory Visit
Admission: RE | Admit: 2017-10-22 | Discharge: 2017-10-22 | Disposition: A | Discharge status: Home / Self Care | Payer: BLUE CROSS/BLUE SHIELD | Source: Ambulatory Visit | Attending: Family Medicine | Admitting: Family Medicine

## 2017-10-22 DIAGNOSIS — Z1231 Encounter for screening mammogram for malignant neoplasm of breast: Secondary | ICD-10-CM

## 2017-10-23 ENCOUNTER — Ambulatory Visit (HOSPITAL_COMMUNITY)
Admission: RE | Admit: 2017-10-23 | Discharge: 2017-10-23 | Disposition: A | Discharge status: Home / Self Care | Payer: BLUE CROSS/BLUE SHIELD | Source: Ambulatory Visit | Attending: Urology | Admitting: Urology

## 2017-10-23 DIAGNOSIS — D3000 Benign neoplasm of unspecified kidney: Secondary | ICD-10-CM | POA: Diagnosis not present

## 2017-10-23 DIAGNOSIS — N2889 Other specified disorders of kidney and ureter: Secondary | ICD-10-CM | POA: Diagnosis not present

## 2017-10-23 DIAGNOSIS — D3002 Benign neoplasm of left kidney: Secondary | ICD-10-CM | POA: Diagnosis not present

## 2017-10-30 ENCOUNTER — Ambulatory Visit: Payer: BLUE CROSS/BLUE SHIELD | Admitting: Urology

## 2017-10-30 DIAGNOSIS — D3002 Benign neoplasm of left kidney: Secondary | ICD-10-CM | POA: Diagnosis not present

## 2017-11-21 ENCOUNTER — Ambulatory Visit: Payer: BLUE CROSS/BLUE SHIELD

## 2017-12-03 ENCOUNTER — Ambulatory Visit (INDEPENDENT_AMBULATORY_CARE_PROVIDER_SITE_OTHER): Payer: Self-pay

## 2017-12-03 DIAGNOSIS — Z1211 Encounter for screening for malignant neoplasm of colon: Secondary | ICD-10-CM

## 2017-12-03 MED ORDER — NA SULFATE-K SULFATE-MG SULF 17.5-3.13-1.6 GM/177ML PO SOLN: 1.0000 | ORAL | 0 refills | Status: DC

## 2017-12-03 NOTE — Patient Instructions (Addendum)
Sheila [name]   1965-08-04 MRN: [id number]    Procedure Date: 01/18/18 Time to register: 1:00pm Place to register: [facility] Procedure Time:2:00pm Scheduled provider: R. Garfield Cornea, MD  PREPARATION FOR COLONOSCOPY WITH TRI-LYTE SPLIT PREP  Please notify us immediately if you are diabetic, take iron supplements, or if you are on Coumadin or any other blood thinners.     You will need to purchase 1 fleet enema and 1 box of Bisacodyl '5mg'$  tablets. You can purchase these at your pharmacy.    2 DAYS BEFORE PROCEDURE:  DATE: 01/16/18   DAY: Wednesday Begin clear liquid diet AFTER your lunch meal. NO SOLID FOODS after this point.  1 DAY BEFORE PROCEDURE:  DATE: 01/17/18   DAY: Thursday Continue clear liquids the entire day - NO SOLID FOOD.   At 2:00 pm:  Take 2 Bisacodyl tablets.   At 4:00pm:  Start drinking your solution. Make sure you mix well per instructions on the bottle. Try to drink 1 (one) 8 ounce glass every 10-15 minutes until you have consumed HALF the jug. You should complete by 6:00pm.You must keep the left over solution refrigerated until completed next day.  Continue clear liquids. You must drink plenty of clear liquids to prevent dehyration and kidney failure.     DAY OF PROCEDURE:   DATE: 01/18/18   DAY: Friday If you take medications for your heart, blood pressure or breathing, you may take these medications.   Five hours before your procedure time @ 9:00am:  Finish remaining amout of bowel prep, drinking 1 (one) 8 ounce glass every 10-15 minutes until complete. You have two hours to consume remaining prep.   Three hours before your procedure time '@11'$ :00am:  Nothing by mouth.   At least one hour before going to the hospital:  Give yourself one Fleet enema. You may take your morning medications with sip of water unless we have instructed otherwise.      Please see below for Dietary Information.  CLEAR LIQUIDS INCLUDE:  Water Jello (NOT red in  color)   Ice Popsicles (NOT red in color)   Tea (sugar ok, no milk/cream) Powdered fruit flavored drinks  Coffee (sugar ok, no milk/cream) Gatorade/ Lemonade/ Kool-Aid  (NOT red in color)   Juice: apple, white grape, white cranberry Soft drinks  Clear bullion, consomme, broth (fat free beef/chicken/vegetable)  Carbonated beverages (any kind)  Strained chicken noodle soup Hard Candy   Remember: Clear liquids are liquids that will allow you to see your fingers on the other side of a clear glass. Be sure liquids are NOT red in color, and not cloudy, but CLEAR.  DO NOT EAT OR DRINK ANY OF THE FOLLOWING:  Dairy products of any kind   Cranberry juice Tomato juice / V8 juice   Grapefruit juice Orange juice     Red grape juice  Do not eat any solid foods, including such foods as: cereal, oatmeal, yogurt, fruits, vegetables, creamed soups, eggs, bread, crackers, pureed foods in a blender, etc.   HELPFUL HINTS FOR DRINKING PREP SOLUTION:   Make sure prep is extremely cold. Mix and refrigerate the the morning of the prep. You may also put in the freezer.   You may try mixing some Crystal Light or Country Time Lemonade if you prefer. Mix in small amounts; add more if necessary.  Try drinking through a straw  Rinse mouth with water or a mouthwash between glasses, to remove after-taste.  Try sipping on a cold beverage /  ice/ popsicles between glasses of prep.  Place a piece of sugar-free hard candy in mouth between glasses.  If you become nauseated, try consuming smaller amounts, or stretch out the time between glasses. Stop for 30-60 minutes, then slowly start back drinking.        OTHER INSTRUCTIONS  You will need a responsible adult at least 50 years of age to accompany you and drive you home. This person must remain in the waiting room during your procedure. The hospital will cancel your procedure if you do not have a responsible adult with you.   1. Wear loose fitting clothing that  is easily removed. 2. Leave jewelry and other valuables at home.  3. Remove all body piercing jewelry and leave at home. 4. Total time from sign-in until discharge is approximately 2-3 hours. 5. You should go home directly after your procedure and rest. You can resume normal activities the day after your procedure. 6. The day of your procedure you should not:  Drive  Make legal decisions  Operate machinery  Drink alcohol  Return to work   You may call the office (Dept: [phone]) before 5:00pm, or page the doctor on call [phone]) after 5:00pm, for further instructions, if necessary.   Insurance Information YOU WILL NEED TO CHECK WITH YOUR INSURANCE COMPANY FOR THE BENEFITS OF COVERAGE YOU HAVE FOR THIS PROCEDURE.  UNFORTUNATELY, NOT ALL INSURANCE COMPANIES HAVE BENEFITS TO COVER ALL OR PART OF THESE TYPES OF PROCEDURES.  IT IS YOUR RESPONSIBILITY TO CHECK YOUR BENEFITS, HOWEVER, WE WILL BE GLAD TO ASSIST YOU WITH ANY CODES YOUR INSURANCE COMPANY MAY NEED.    PLEASE NOTE THAT MOST INSURANCE COMPANIES WILL NOT COVER A SCREENING COLONOSCOPY FOR PEOPLE UNDER THE AGE OF 50  IF YOU HAVE BCBS INSURANCE, YOU MAY HAVE BENEFITS FOR A SCREENING COLONOSCOPY BUT IF POLYPS ARE FOUND THE DIAGNOSIS WILL CHANGE AND THEN YOU MAY HAVE A DEDUCTIBLE THAT WILL NEED TO BE MET. SO PLEASE MAKE SURE YOU CHECK YOUR BENEFITS FOR A SCREENING COLONOSCOPY AS WELL AS A DIAGNOSTIC COLONOSCOPY.

## 2017-12-03 NOTE — Progress Notes (Addendum)
Gastroenterology Pre-Procedure Review  Request Date:12/03/17 Requesting Physician: recall last tcs 12/05/10 RMR- no polyps- diverticula  PATIENT REVIEW QUESTIONS: The patient responded to the following health history questions as indicated:    Pt had 5 inches of her colon removed in 2017 due to diverticulitis. She said she is doing fine from that surgery and is not having any problems.   1. Diabetes Melitis: no 2. Joint replacements in the past 12 months: no 3. Major health problems in the past 3 months: no 4. Has an artificial valve or MVP: no 5. Has a defibrillator: no 6. Has been advised in past to take antibiotics in advance of a procedure like teeth cleaning: no 7. Family history of colon cancer: no  8. Alcohol Use: no 9. History of sleep apnea: no  10. History of coronary artery or other vascular stents placed within the last 12 months: no 11. History of any prior anesthesia complications: yes (has problems with propofol- dry mouth and makes her act crazy for several hours)    MEDICATIONS & ALLERGIES:    Patient reports the following regarding taking any blood thinners:   Plavix? no Aspirin? no Coumadin? no Brilinta? no Xarelto? no Eliquis? no Pradaxa? no Savaysa? no Effient? no  Patient confirms/reports the following medications:  Current Outpatient Medications  Medication Sig Dispense Refill  . acetaminophen (TYLENOL) 500 MG tablet Take 1,000 mg by mouth every 6 (six) hours as needed for mild pain.    . Calcium Polycarbophil (FIBER-CAPS PO) Take by mouth.    . citalopram (CELEXA) 40 MG tablet Take 40 mg by mouth daily.    [name] lisinopril (PRINIVIL,ZESTRIL) 10 MG tablet Take 10 mg by mouth daily.    [name] omeprazole (PRILOSEC OTC) 20 MG tablet Take 20 mg by mouth daily.    . Probiotic Product (PROBIOTIC DAILY PO) Take 1 capsule by mouth daily.    . rizatriptan (MAXALT) 10 MG tablet Take 10 mg by mouth as needed for migraine. May repeat in 2 hours if needed     No current  facility-administered medications for this visit.     Patient confirms/reports the following allergies:  Allergies  Allergen Reactions  . Lactose Intolerance (Gi)   . Sulfa Antibiotics Rash    No orders of the defined types were placed in this encounter.   AUTHORIZATION INFORMATION Primary Insurance: [place],  [place] #: [id number] Pre-Cert / Josem Kaufmann required: no   SCHEDULE INFORMATION: Procedure has been scheduled as follows:  Date: 01/18/18, Time:2:00 Location: [facility] Dr[facility]  This Gastroenterology Pre-Precedure Review Form is being routed to the following provider(s): Roseanne Kaufman NP

## 2017-12-05 IMAGING — US US RENAL
1 series · 14 of 25 positions shown · non-contrast
Comparison: MRI 01/03/2016 and CT from 10/19/2015

CLINICAL DATA: Left kidney neoplasm, benign.  Follow-up.

EXAM:
RENAL / URINARY TRACT ULTRASOUND COMPLETE

[Series 1: us renal · 0.24mm/px · 14 of 54 slices shown]
[im 1/54]
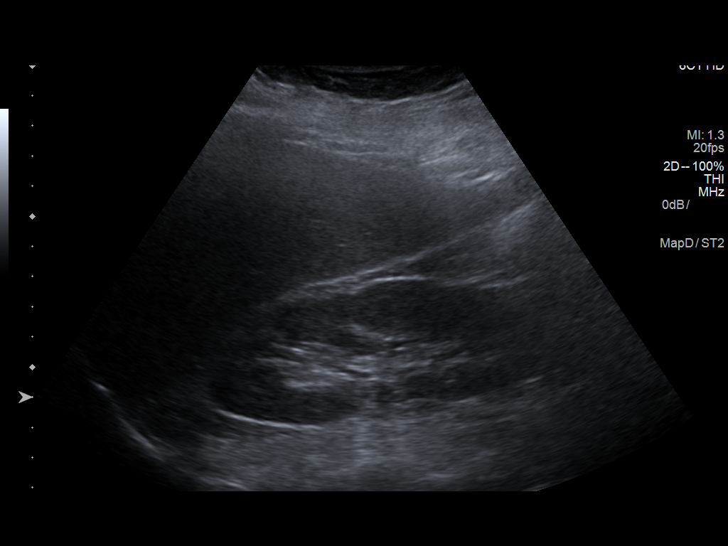
[im 5/54]
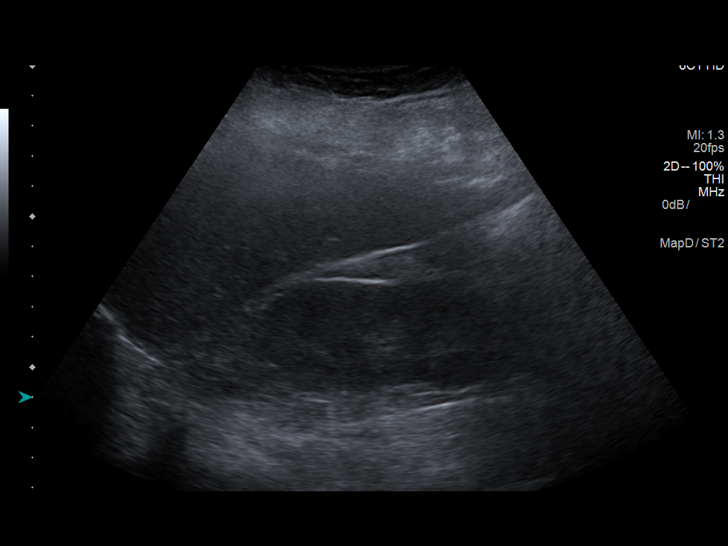
[im 9/54]
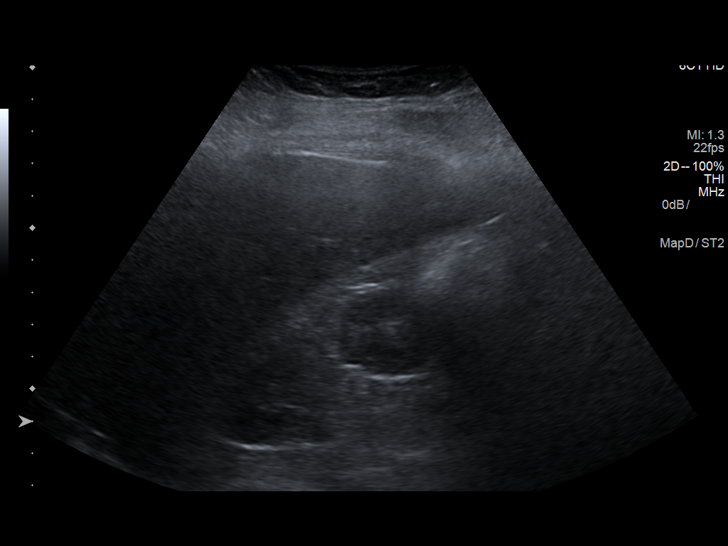
[im 14/54]
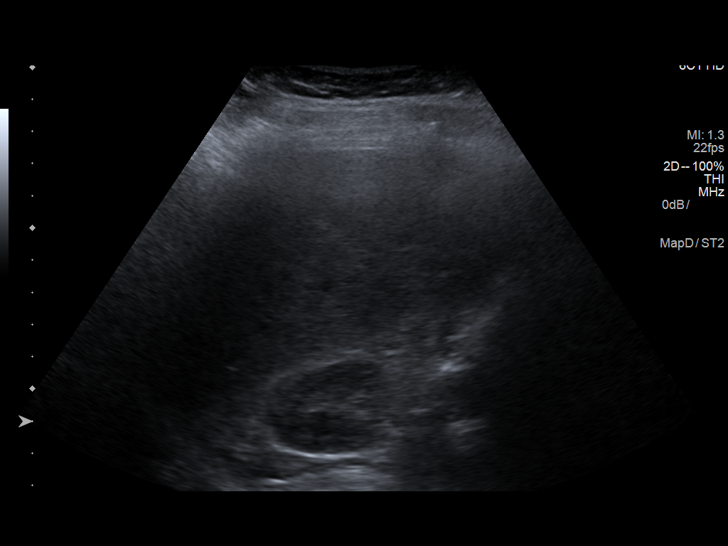
[im 18/54]
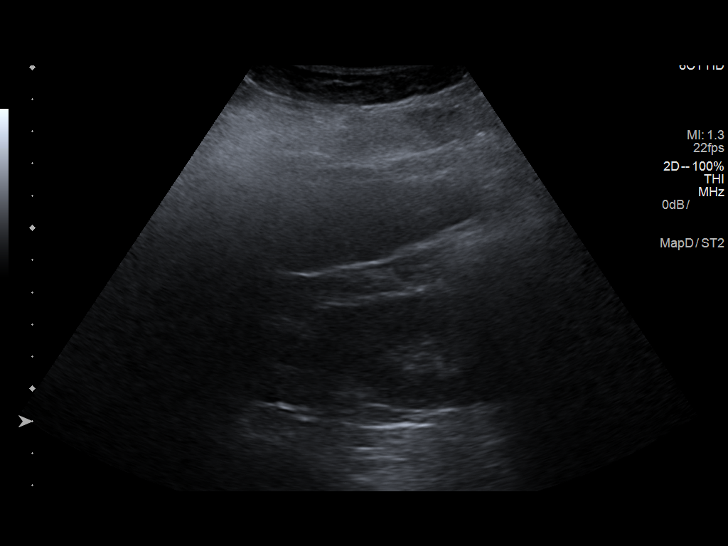
[im 20/54]
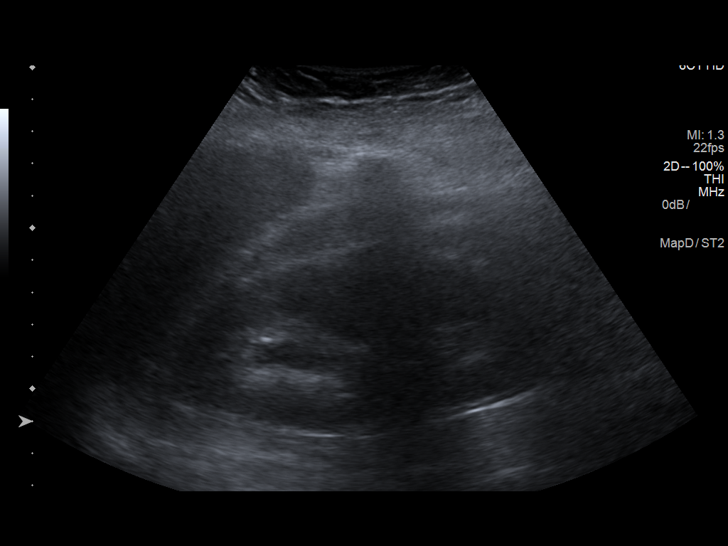
[im 25/54]
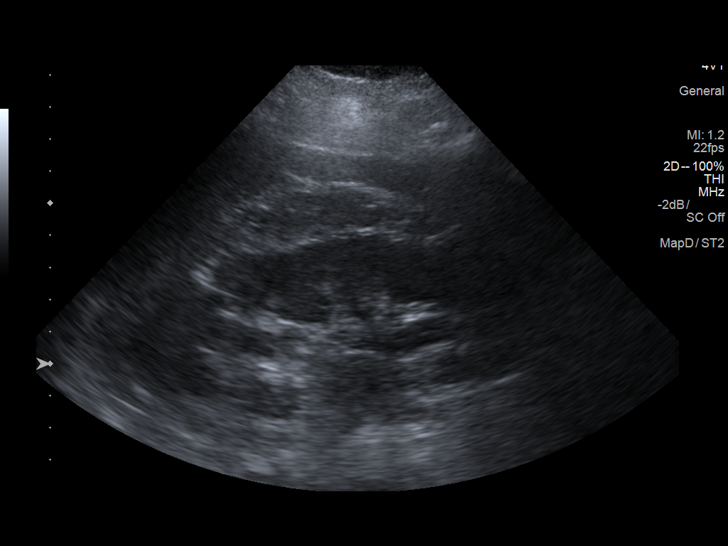
[im 29/54]
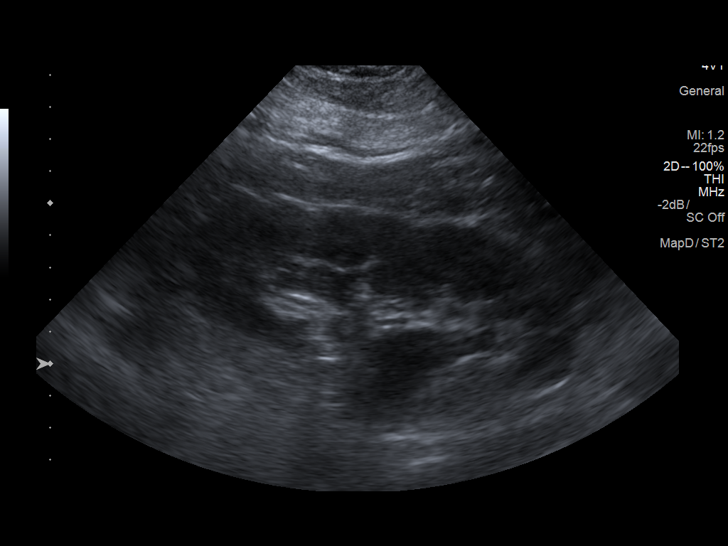
[im 34/54]
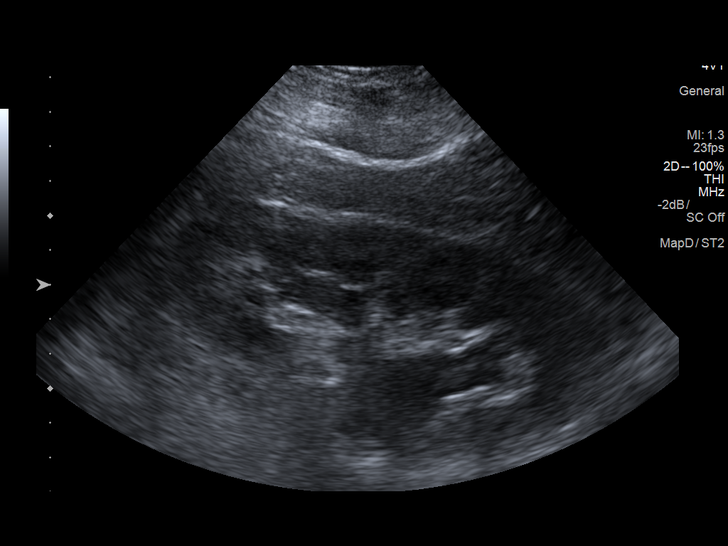
[im 36/54]
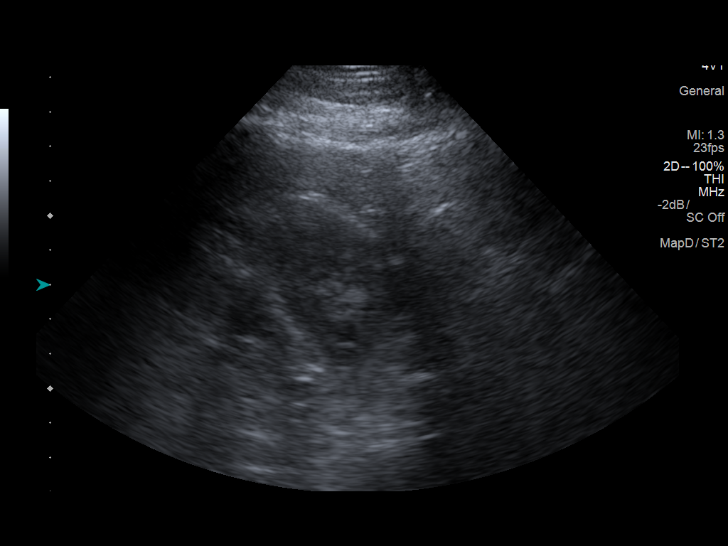
[im 40/54]
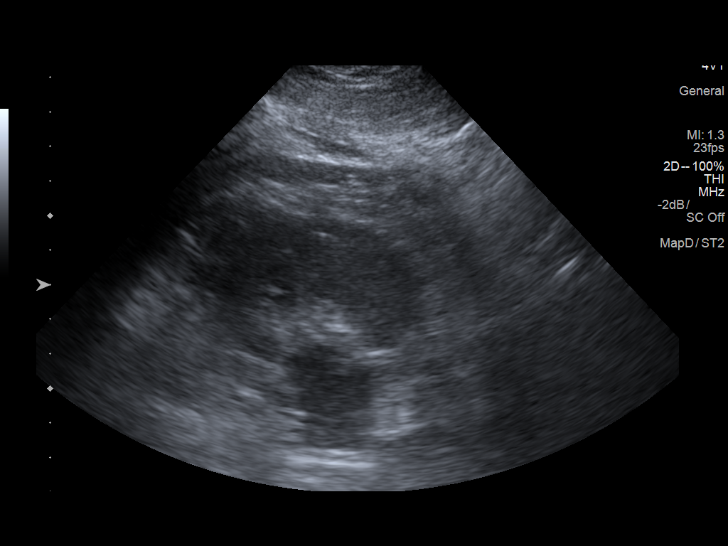
[im 45/54]
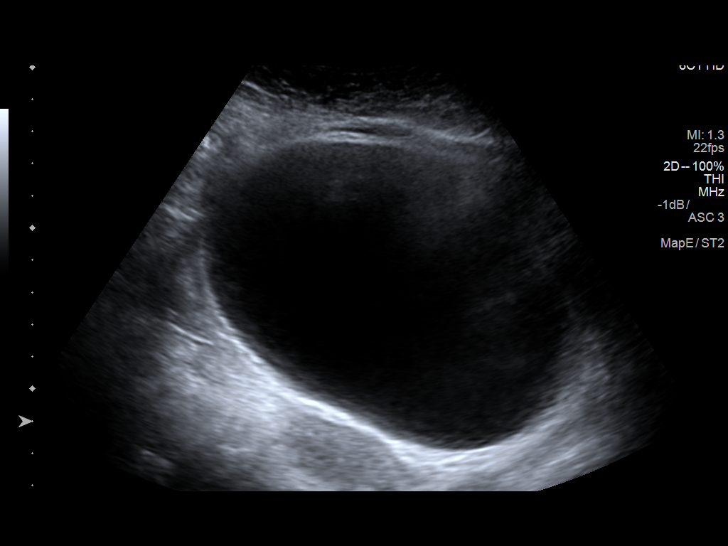
[im 49/54]
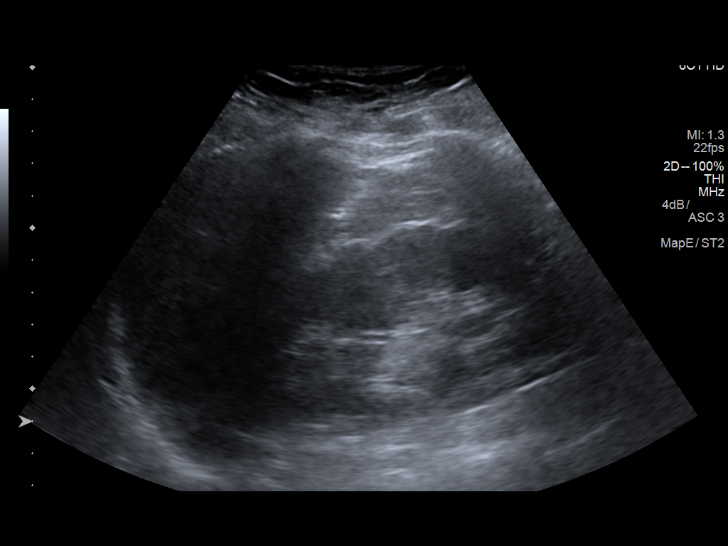
[im 54/54]
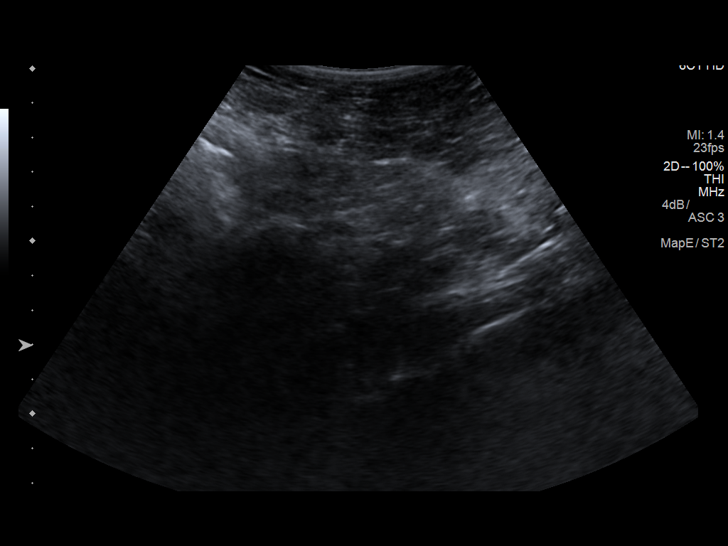

[14 of 25 positions shown; findings below may reference images not displayed]

FINDINGS: Right Kidney:

Length: 11.8 cm. Normal echogenicity. Focal nodularity in the right
kidney interpolar region appears to represent normal tissue when
comparing to the previous cross-sectional imaging. No
hydronephrosis.

Left Kidney:

Length: 11.6 cm. Normal echogenicity. Fullness in the left renal
pelvis resolves after voiding. There appears to be a subtle
exophytic structure in the left kidney measuring roughly 1.2 cm.
This may correspond with the small anterior exophytic lesion seen on
the previous cross-sectional imaging. This area is poorly
characterized on ultrasound.

Bladder:

Bladder was distended on the prevoid images.
IMPRESSION: Small exophytic lesion in the left kidney measures roughly 1.2 cm
but poorly characterized on this examination. This area could be
better followed with CT or MRI.

Fullness in the left renal pelvis resolved after voiding.

## 2017-12-05 NOTE — Progress Notes (Signed)
Appropriate.

## 2017-12-18 MED ORDER — PEG 3350-KCL-NA BICARB-NACL 420 G PO SOLR: 4000.0000 mL | ORAL | 0 refills | Status: DC

## 2017-12-18 NOTE — Progress Notes (Signed)
Spoke with the pt, she said prep was $100. I have sent in trilyte rx and mailed new instructions to her.

## 2017-12-18 NOTE — Addendum Note (Signed)
Addended by: Claudina Lick on: 12/18/2017 02:27 PM   Modules accepted: Orders

## 2017-12-21 DIAGNOSIS — Z23 Encounter for immunization: Secondary | ICD-10-CM | POA: Diagnosis not present

## 2017-12-21 DIAGNOSIS — Z79899 Other long term (current) drug therapy: Secondary | ICD-10-CM | POA: Diagnosis not present

## 2017-12-21 DIAGNOSIS — F419 Anxiety disorder, unspecified: Secondary | ICD-10-CM | POA: Diagnosis not present

## 2017-12-21 DIAGNOSIS — I10 Essential (primary) hypertension: Secondary | ICD-10-CM | POA: Diagnosis not present

## 2017-12-21 DIAGNOSIS — Z0001 Encounter for general adult medical examination with abnormal findings: Secondary | ICD-10-CM | POA: Diagnosis not present

## 2017-12-21 DIAGNOSIS — Z1321 Encounter for screening for nutritional disorder: Secondary | ICD-10-CM | POA: Diagnosis not present

## 2017-12-21 DIAGNOSIS — L301 Dyshidrosis [pompholyx]: Secondary | ICD-10-CM | POA: Diagnosis not present

## 2017-12-21 DIAGNOSIS — G479 Sleep disorder, unspecified: Secondary | ICD-10-CM | POA: Diagnosis not present

## 2018-01-18 ENCOUNTER — Other Ambulatory Visit: Payer: Self-pay

## 2018-01-18 ENCOUNTER — Encounter (HOSPITAL_COMMUNITY): Payer: Self-pay | Admitting: *Deleted

## 2018-01-18 ENCOUNTER — Ambulatory Visit (HOSPITAL_COMMUNITY)
Admission: RE | Admit: 2018-01-18 | Discharge: 2018-01-18 | Disposition: A | Discharge status: Home / Self Care | Payer: BLUE CROSS/BLUE SHIELD | Source: Ambulatory Visit | Attending: Internal Medicine | Admitting: Internal Medicine

## 2018-01-18 ENCOUNTER — Encounter (HOSPITAL_COMMUNITY)
Admission: RE | Disposition: A | Discharge status: Home / Self Care | Payer: Self-pay | Source: Ambulatory Visit | Attending: Internal Medicine

## 2018-01-18 DIAGNOSIS — Z980 Intestinal bypass and anastomosis status: Secondary | ICD-10-CM | POA: Diagnosis not present

## 2018-01-18 DIAGNOSIS — Z8719 Personal history of other diseases of the digestive system: Secondary | ICD-10-CM | POA: Diagnosis not present

## 2018-01-18 DIAGNOSIS — K219 Gastro-esophageal reflux disease without esophagitis: Secondary | ICD-10-CM | POA: Diagnosis not present

## 2018-01-18 DIAGNOSIS — G43909 Migraine, unspecified, not intractable, without status migrainosus: Secondary | ICD-10-CM | POA: Diagnosis not present

## 2018-01-18 DIAGNOSIS — Z9049 Acquired absence of other specified parts of digestive tract: Secondary | ICD-10-CM | POA: Diagnosis not present

## 2018-01-18 DIAGNOSIS — Z1211 Encounter for screening for malignant neoplasm of colon: Secondary | ICD-10-CM | POA: Diagnosis not present

## 2018-01-18 DIAGNOSIS — Z79899 Other long term (current) drug therapy: Secondary | ICD-10-CM | POA: Diagnosis not present

## 2018-01-18 HISTORY — PX: COLONOSCOPY: SHX5424

## 2018-01-18 SURGERY — COLONOSCOPY
Anesthesia: Moderate Sedation

## 2018-01-18 MED ORDER — MIDAZOLAM HCL 5 MG/5ML IJ SOLN
INTRAMUSCULAR | Status: AC
  Filled 2018-01-18: qty 10

## 2018-01-18 MED ORDER — ONDANSETRON HCL 4 MG/2ML IJ SOLN
INTRAMUSCULAR | Status: AC
  Filled 2018-01-18: qty 2

## 2018-01-18 MED ORDER — MEPERIDINE HCL 100 MG/ML IJ SOLN
INTRAMUSCULAR | Status: DC | PRN
  Administered 2018-01-18: 25 mg

## 2018-01-18 MED ORDER — MIDAZOLAM HCL 5 MG/5ML IJ SOLN
INTRAMUSCULAR | Status: DC | PRN
  Administered 2018-01-18: 1 mg via INTRAVENOUS
  Administered 2018-01-18: 2 mg via INTRAVENOUS
  Administered 2018-01-18: 1 mg via INTRAVENOUS

## 2018-01-18 MED ORDER — MEPERIDINE HCL 50 MG/ML IJ SOLN
INTRAMUSCULAR | Status: AC
  Filled 2018-01-18: qty 1

## 2018-01-18 MED ORDER — ONDANSETRON HCL 4 MG/2ML IJ SOLN
INTRAMUSCULAR | Status: DC | PRN
  Administered 2018-01-18: 4 mg via INTRAVENOUS

## 2018-01-18 MED ORDER — SODIUM CHLORIDE 0.9 % IV SOLN
INTRAVENOUS | Status: DC
  Administered 2018-01-18: 13:00:00 via INTRAVENOUS

## 2018-01-18 NOTE — H&P (Signed)
_0 @   Primary Care Physician:  Cari Caraway, MD Primary Gastroenterologist:  Dr. Gala Romney : HPI:  Sheila [name] is a 52 y.o. female is here for a screening colonoscopy.  First ever average risk screening examination.  History of complicated diverticulitis requiring segmental resection in the past.  Past Medical History:  Diagnosis Date  . Diverticulitis   . GERD (gastroesophageal reflux disease)   . Hypertension    not taking lisinopril now due to weight loss, PCP told her to hold for now  . Migraine headache    last one over 1 month ago.  [name] Pelvic abscess in female    see PSH    Past Surgical History:  Procedure Laterality Date  . CHOLECYSTECTOMY        . COLONOSCOPY  12/05/2010   left sided diverticulosis, melanosis coli. Next TCS at age 87. Procedure: COLONOSCOPY;  Surgeon: Daneil Dolin, MD;  [facility];  Service: Endoscopy;  Laterality: N/A;  8:15AM  . DILATION AND CURETTAGE OF UTERUS  02/06/2011   Procedure: DILATATION AND CURETTAGE (D&C);  Surgeon: Jonnie Kind, MD;  Location: [facility];  Service: Gynecology;  Laterality: N/A;  . LAPAROSCOPY  02/06/2011   Procedure: LAPAROSCOPY DIAGNOSTIC;  Surgeon: Jonnie Kind, MD;  Location: [facility];  Service: Gynecology;  Laterality: N/A;  . PARTIAL COLECTOMY N/A 09/17/2015   Procedure: PARTIAL COLECTOMY, REPAIR OF BLADDER FISTULA;  Surgeon: Aviva Signs, MD;  Location: [facility];  Service: General;  Laterality: N/A;  . SALPINGOOPHORECTOMY  02/06/2011   Procedure: SALPINGO OOPHERECTOMY;  Surgeon: Jonnie Kind, MD;  Location: [facility];  Service: Gynecology;  Laterality: N/A;  procedure started at 1241  . TONSILLECTOMY     as child    Prior to Admission medications   Medication Sig Start Date End Date Taking? Authorizing Provider  Calcium Polycarbophil (FIBER-CAPS PO) Take 2 capsules by mouth 3 (three) times daily.    Yes [provider]  Carboxymethylcellul-Glycerin (LUBRICATING EYE DROPS OP) Place 1 drop into  both eyes daily as needed (dry eyes).   Yes [provider]  Cholecalciferol (VITAMIN D3) 5000 units CAPS Take 5,000 Units by mouth daily.   Yes [provider]  citalopram (CELEXA) 40 MG tablet Take 40 mg by mouth daily.   Yes [provider]  diphenhydrAMINE HCl, Sleep, (RESTFULLY SLEEP PO) Take 2 tablets by mouth at bedtime as needed (sleep).   Yes [provider]  ibuprofen (ADVIL,MOTRIN) 200 MG tablet Take 800 mg by mouth 2 (two) times daily as needed for headache.   Yes [provider]  lisinopril (PRINIVIL,ZESTRIL) 10 MG tablet Take 10 mg by mouth daily.   Yes [provider]  omeprazole (PRILOSEC OTC) 20 MG tablet Take 20 mg by mouth daily.   Yes [provider]  OVER THE COUNTER MEDICATION Take 1,000 mg by mouth daily. Holy basil otc supplement   Yes [provider]  polyethylene glycol-electrolytes (TRILYTE) 420 g solution Take 4,000 mLs by mouth as directed. 12/18/17  Yes Annitta Needs, NP  Probiotic Product (PROBIOTIC DAILY PO) Take 1 capsule by mouth daily.   Yes [provider]  Na Sulfate-K Sulfate-Mg Sulf (SUPREP BOWEL PREP KIT) 17.5-3.13-1.6 GM/177ML SOLN Take 1 kit by mouth as directed. Patient not taking: Reported on 01/14/2018 12/03/17   Annitta Needs, NP  rizatriptan (MAXALT) 10 MG tablet Take 10 mg by mouth as needed for migraine. May repeat in 2 hours if needed    [provider]    Allergies as of 12/03/2017 - Review Complete 12/03/2017  Allergen Reaction Noted  . Lactose intolerance (gi)  02/18/2011  . Sulfa antibiotics Rash 10/25/2010    Family History  Problem Relation Age of Onset  . Hypertension Father   . Hyperlipidemia Father   . Colon cancer Neg Hx     Social History   Socioeconomic History  . Marital status: Divorced    Spouse name: Not on file  . Number of children: 2  . Years of education: Not on file  . Highest education level: Not on file  Occupational  History  . Occupation: full-time    Comment: insurance   Social Needs  . Financial resource strain: Not on file  . Food insecurity:    Worry: Not on file    Inability: Not on file  . Transportation needs:    Medical: Not on file    Non-medical: Not on file  Tobacco Use  . Smoking status: Never Smoker  . Smokeless tobacco: Never Used  Substance and Sexual Activity  . Alcohol use: No  . Drug use: No  . Sexual activity: Yes    Birth control/protection: Post-menopausal  Lifestyle  . Physical activity:    Days per week: Not on file    Minutes per session: Not on file  . Stress: Not on file  Relationships  . Social connections:    Talks on phone: Not on file    Gets together: Not on file    Attends religious service: Not on file    Active member of club or organization: Not on file    Attends meetings of clubs or organizations: Not on file    Relationship status: Not on file  . Intimate partner violence:    Fear of current or ex partner: Not on file    Emotionally abused: Not on file    Physically abused: Not on file    Forced sexual activity: Not on file  Other Topics Concern  . Not on file  Social History Narrative  . Not on file    Review of Systems: See HPI, otherwise negative ROS  Physical Exam: BP 113/77   Pulse 76   Temp 98.4 F (36.9 C) (Oral)   Resp 14   Ht 5' 5" (1.651 m)   Wt 78.9 kg   LMP 09/28/2014   SpO2 98%   BMI 28.96 kg/m  General:   Alert,  Well-developed, well-nourished, pleasant and cooperative in NAD  Heart:  Regular rate and rhythm; no murmurs, clicks, rubs,  or gallops. Abdomen:  Soft, nontender and nondistended. No masses, hepatosplenomegaly or hernias noted. Normal bowel sounds, without guarding, and without rebound.    Impression/Plan: Sheila [name] is now here to undergo a screening colonoscopy.  Average risk screening examination.  Risks, benefits, limitations, imponderables and alternatives regarding colonoscopy have been  reviewed with the patient. Questions have been answered. All parties agreeable.     Notice:  This dictation was prepared with Dragon dictation along with smaller phrase technology. Any transcriptional errors that result from this process are unintentional and may not be corrected upon review.

## 2018-01-18 NOTE — Interval H&P Note (Signed)
History and Physical Interval Note:  01/18/2018 1:11 PM  Sheila [name]  has presented today for surgery, with the diagnosis of screening  The various methods of treatment have been discussed with the patient and family. After consideration of risks, benefits and other options for treatment, the patient has consented to  Procedure(s) with comments: COLONOSCOPY (N/A) - 2:00 as a surgical intervention .  The patient's history has been reviewed, patient examined, no change in status, stable for surgery.  I have reviewed the patient's chart and labs.  Questions were answered to the patient's satisfaction.     Aseem Sessums  No change.  Screening colonoscopy per plan.  The risks, benefits, limitations, alternatives and imponderables have been reviewed with the patient. Questions have been answered. All parties are agreeable.

## 2018-01-18 NOTE — Op Note (Signed)
[facility] Patient Name: Sheila [name] Procedure Date: 01/18/2018 12:58 PM MRN: [id number] Date of Birth: 19-Mar-1966 Attending MD: Norvel Richards , MD CSN: [id number] Age: 52 Admit Type: Outpatient Procedure:                Colonoscopy Indications:              Screening for colorectal malignant neoplasm Providers:                Norvel Richards, MD, Janeece Riggers, RN, Aram Candela Referring MD:              Medicines:                Midazolam 4 mg IV, Meperidine 25 mg IV, Ondansetron                            4 mg IV Complications:            No immediate complications. Estimated Blood Loss:     Estimated blood loss: none. Procedure:                Pre-Anesthesia Assessment:                           - Prior to the procedure, a History and Physical                            was performed, and patient medications and                            allergies were reviewed. The patient's tolerance of                            previous anesthesia was also reviewed. The risks                            and benefits of the procedure and the sedation                            options and risks were discussed with the patient.                            All questions were answered, and informed consent                            was obtained. ASA Grade Assessment: II - A patient                            with mild systemic disease. After reviewing the                            risks and benefits, the patient was deemed in  satisfactory condition to undergo the procedure.                           After obtaining informed consent, the colonoscope                            was passed under direct vision. Throughout the                            procedure, the patient's blood pressure, pulse, and                            oxygen saturations were monitored continuously. The                            CF-[id number] ([id number])  scope was introduced through                            the anus and advanced to the the cecum, identified                            by appendiceal orifice and ileocecal valve. The                            colonoscopy was performed without difficulty. The                            patient tolerated the procedure well. The quality                            of the bowel preparation was adequate. The                            ileocecal valve, appendiceal orifice, and rectum                            were photographed. The entire colon was well                            visualized. Scope In: 1:20:03 PM Scope Out: 1:30:42 PM Total Procedure Duration: 0 hours 10 minutes 39 seconds  Findings:      The perianal and digital rectal examinations were normal. Surgical       anastomosis identified at approximately 20 cm from the verge. No       residual diverticula seen.      The exam was otherwise without abnormality on direct and retroflexion       views. Impression:               -Normal colonoscopy?"status post segmental                            resection..                           - No specimens collected. Moderate Sedation:  Moderate (conscious) sedation was administered by the endoscopy nurse       and supervised by the endoscopist. The following parameters were       monitored: oxygen saturation, heart rate, blood pressure, respiratory       rate, EKG, adequacy of pulmonary ventilation, and response to care.       Total physician intraservice time was 15 minutes. Recommendation:           - Patient has a contact number available for                            emergencies. The signs and symptoms of potential                            delayed complications were discussed with the                            patient. Return to normal activities tomorrow.                            Written discharge instructions were provided to the                            patient.                            - Resume previous diet.                           - Continue present medications.                           - Repeat colonoscopy in 10 years for screening                            purposes.                           - Return to GI office PRN. Procedure Code(s):        --- Professional ---                           [phone], Colonoscopy, flexible; diagnostic, including                            collection of specimen(s) by brushing or washing,                            when performed (separate procedure)                           G0500, Moderate sedation services provided by the                            same physician or other qualified health care  professional performing a gastrointestinal                            endoscopic service that sedation supports,                            requiring the presence of an independent trained                            observer to assist in the monitoring of the                            patient's level of consciousness and physiological                            status; initial 15 minutes of intra-service time;                            patient age 86 years or older (additional time may                            be reported with [phone], as appropriate) Diagnosis Code(s):        --- Professional ---                           Z12.11, Encounter for screening for malignant                            neoplasm of colon CPT copyright 2018 [employer]. All rights reserved. The codes documented in this report are preliminary and upon coder review may  be revised to meet current compliance requirements. [name] [name], MD Norvel Richards, MD 01/18/2018 1:36:02 PM This report has been signed electronically. Number of Addenda: 0

## 2018-01-18 NOTE — Discharge Instructions (Signed)

## 2018-01-24 ENCOUNTER — Encounter (HOSPITAL_COMMUNITY): Payer: Self-pay | Admitting: Internal Medicine

## 2018-02-01 IMAGING — CT CT ABD-PELV W/ CM
2 of 5 series · 15 of 46 positions shown, 17 images · IV contrast (Omnipaque 300)
Comparison: 07/31/2015

CLINICAL DATA: Perforated sigmoid diverticulitis with abscess,
followup, colovesical fistula

EXAM:
CT ABDOMEN AND PELVIS WITH CONTRAST
TECHNIQUE: Multidetector CT imaging of the abdomen and pelvis was performed
using the standard protocol following bolus administration of
intravenous contrast. Sagittal and coronal MPR images reconstructed
from axial data set.
CONTRAST:  100mL 4N2FWF-M11 IOPAMIDOL (4N2FWF-M11) INJECTION 61% IV.
Dilute oral contrast.

[Series 2: abd_pel_with 5.0 b40f · axial · 0.74mm/px · z∈[-417,-27]mm · 12 of 90 slices shown, 14 images]
[im 6/90  soft-tissue]
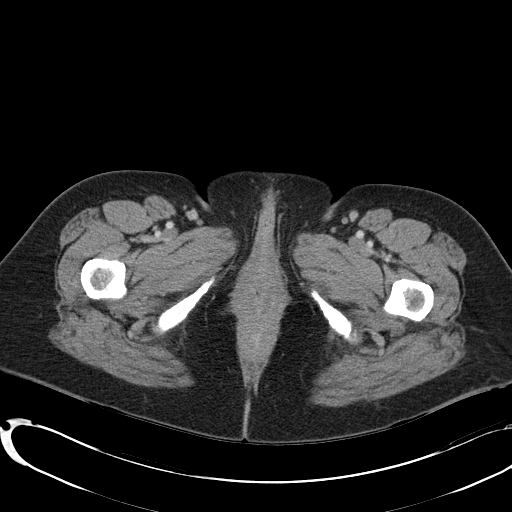
[im 6/90  bone]
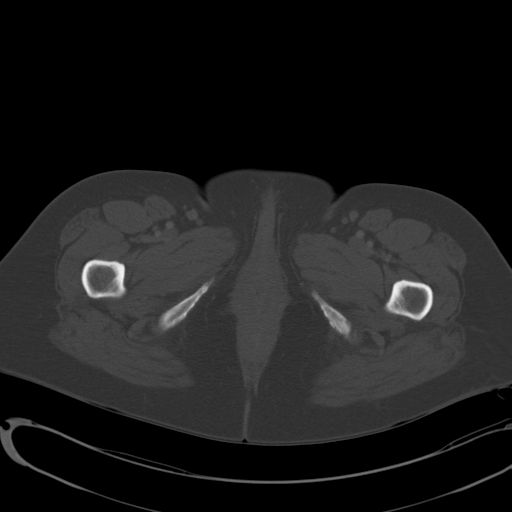
[im 16/90  soft-tissue]
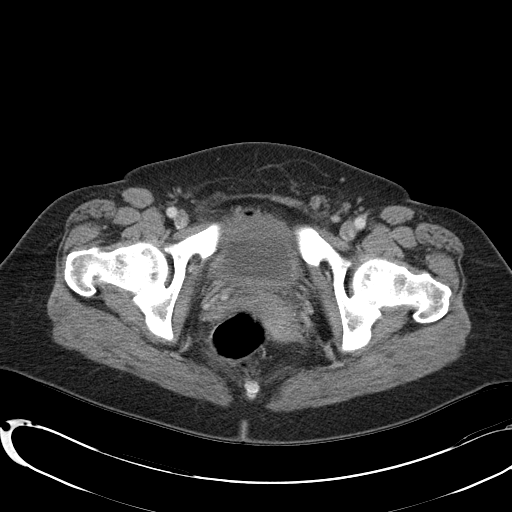
[im 21/90  soft-tissue]
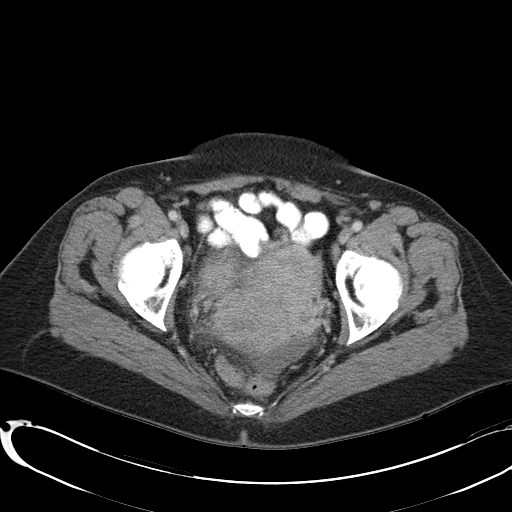
[im 27/90  soft-tissue]
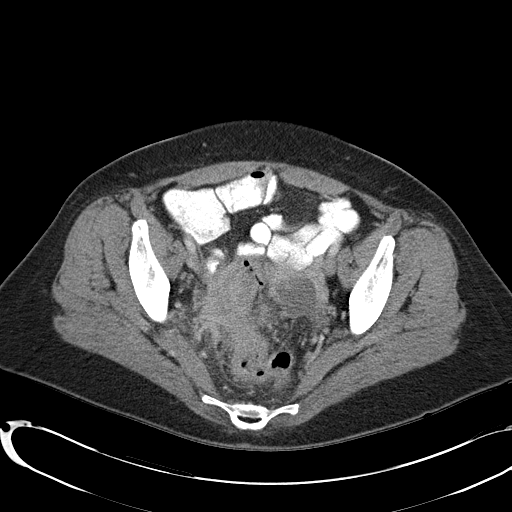
[im 37/90  soft-tissue]
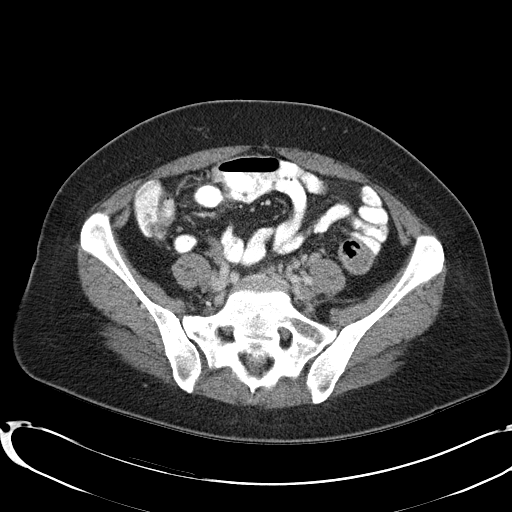
[im 42/90  soft-tissue]
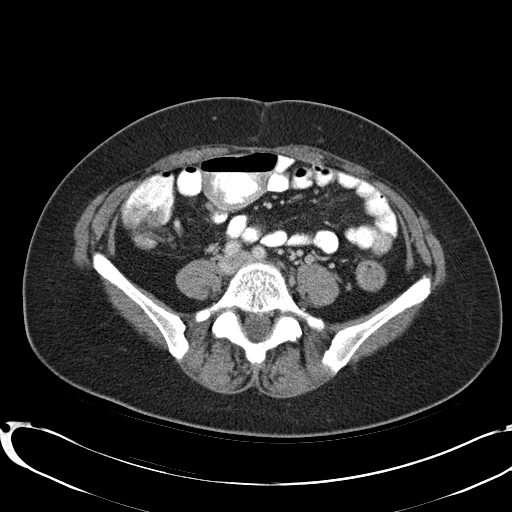
[im 48/90  soft-tissue]
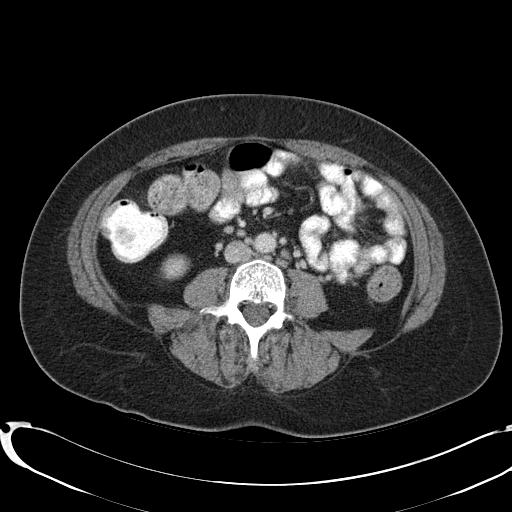
[im 58/90  soft-tissue]
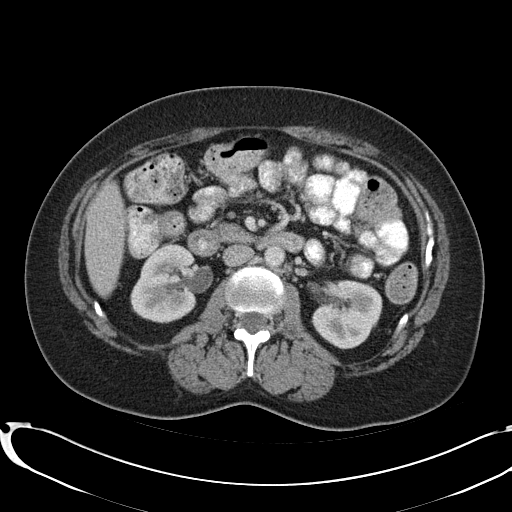
[im 63/90  soft-tissue]
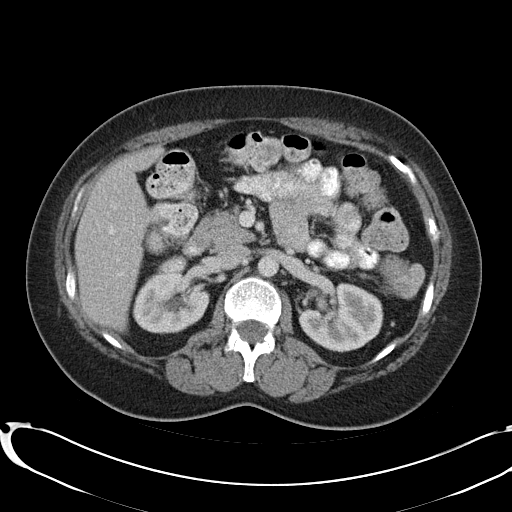
[im 63/90  bone]
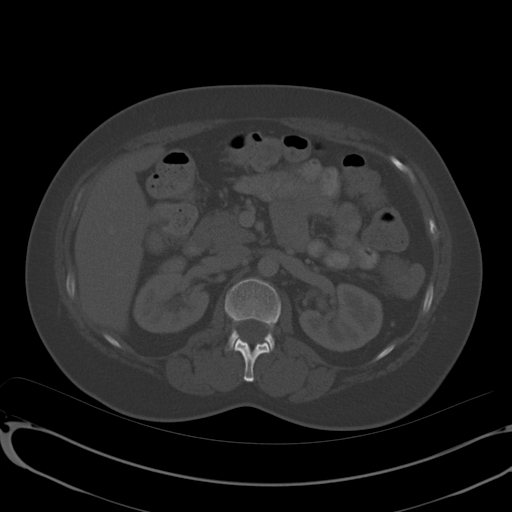
[im 69/90  soft-tissue]
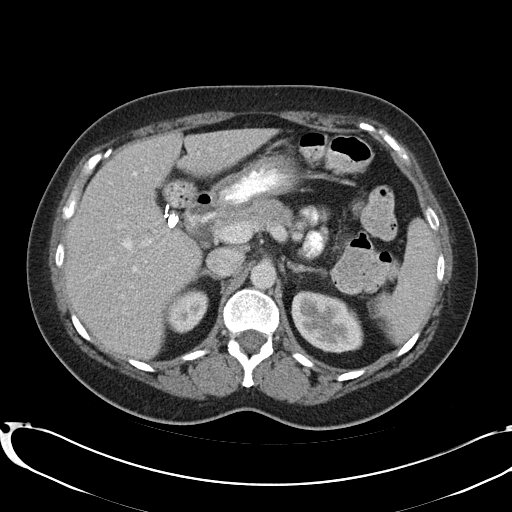
[im 79/90  soft-tissue]
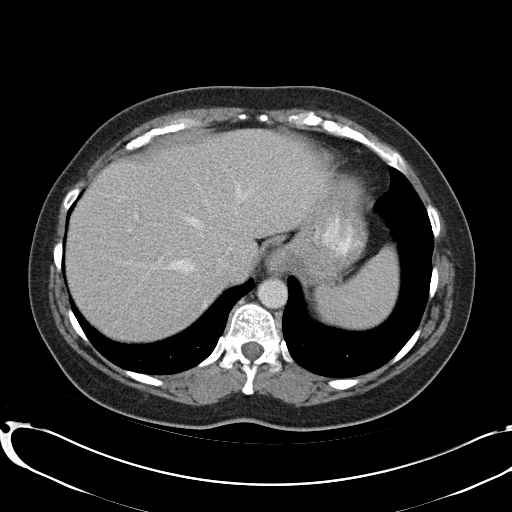
[im 84/90  soft-tissue]
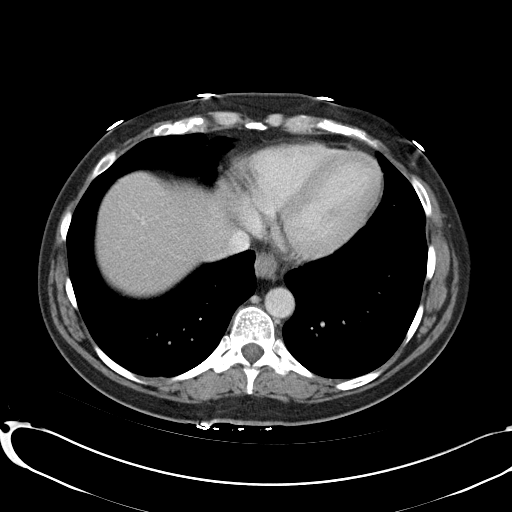

[Series 3: abd_pel_with 3.0 spo cor · coronal · 0.78mm/px · 3 of 79 slices shown]
[im 27/79  soft-tissue]
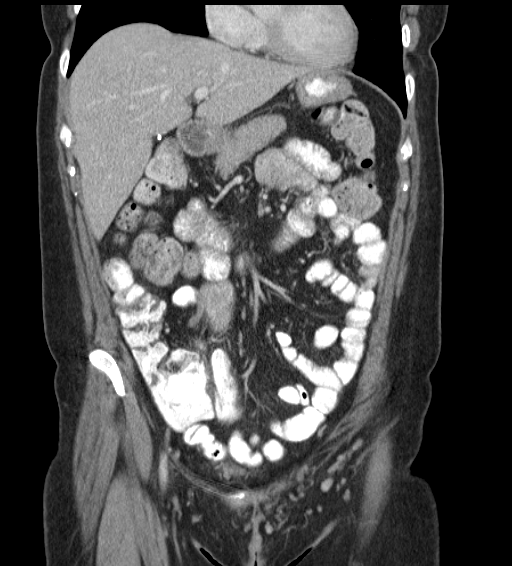
[im 35/79  soft-tissue]
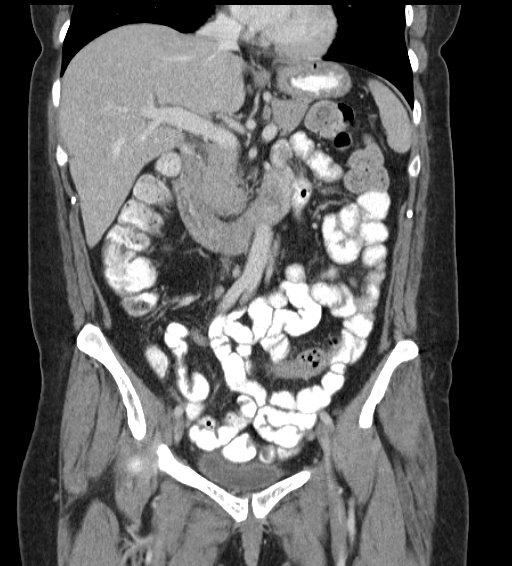
[im 44/79  soft-tissue]
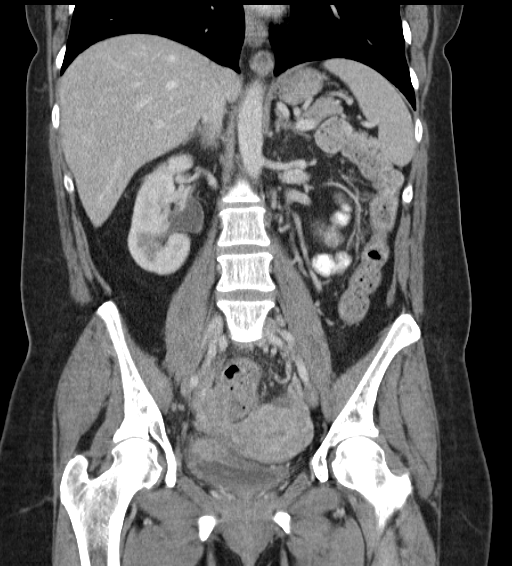

[15 of 46 positions shown; findings below may reference images not displayed]

FINDINGS: Lower chest:  Lung bases clear.

Hepatobiliary: Gallbladder surgically absent. Minimal focal fatty
infiltration of liver adjacent to falciform fissure. Liver otherwise
normal appearance. No biliary dilatation.

Pancreas: Normal appearance

Spleen: Normal appearance

Adrenals/Urinary Tract: Normal appearing adrenal glands. Kidneys
normal in size shape and position without mass or hydronephrosis.
Partially calcified 12 mm renal artery aneurysm at LEFT renal hilum.
Small hyperdense lesion exophytic at anterior mid LEFT kidney 11 x 7
x 12 mm, showing no washout of contrast on delayed images,
unchanged, question hyperdense cystic lesion. No additional masses.
Unremarkable ureters. Gas within urinary bladder, which demonstrates
a thickened wall particulately RIGHT superior ; while gas may be
seen if patient has had recent catheterization or instrumentation,
of the presence of associated bladder wall thickening and adjacent
inflammatory changes of acute diverticulitis are suspicious for a
colovesical fistula.

Stomach/Bowel: Significant wall thickening of the mid to distal
sigmoid colon with pericolic inflammatory changes and note of a few
scattered colonic diverticula consistent with diverticulitis,
distribution similar to previous exam. Increased free fluid in
pelvis both in cul de sac and adjacent to uterus/LEFT ovary.
Extraluminal gas and fluid collection seen to the RIGHT lateral
aspect of the sigmoid colon on the previous exam shows infiltrative
changes and phlegmon at site with a tiny residual loculated fluid
collection which may represent a tiny residual abscess measuring 12
x 7 mm image 69. No other definite focal abscess collections.
Scattered infiltration of sigmoid mesocolon. Small hiatal hernia.
Stomach and a remaining bowel loops unremarkable. Normal appendix
arising from redundant sigmoid colon.

Vascular/Lymphatic: Vascular structures unremarkable. Scattered
normal size retroperitoneal nodes.

Reproductive: Small uterine nodule question leiomyoma 2.2 cm
diameter. Unremarkable ovaries.

Other: No additional mass, free air or hernia.

Musculoskeletal: Osseous structures unremarkable.
IMPRESSION: Increased inflammatory changes of acute sigmoid diverticulitis.

Decreased gas/fluid collection in the RIGHT pelvis compatible with
improving abscess, with a tiny residual 12 x 7 mm diameter
ring-enhancing fluid attenuation focus at site.

Increased bladder wall thickening superiorly with small amount gas
identified within urinary bladder; while gas within the bladder can
be seen with catheterization, the presence of wall thickening and
adjacent infiltrative changes at the superior RIGHT bladder wall
make finding suspicious for a colovesical fistula.

Probable small uterine leiomyoma.

Question complicated high attenuation cystic lesion of the LEFT
kidney, showing no significant washout of contrast on delayed images
; this is been present since exams from 8918 but has slightly
increased in size.

Followup characterization by MR imaging abdomen with and without
contrast following resolution of the patient's current acute process
is recommended.

## 2018-06-19 DIAGNOSIS — J069 Acute upper respiratory infection, unspecified: Secondary | ICD-10-CM | POA: Diagnosis not present

## 2018-06-19 DIAGNOSIS — R05 Cough: Secondary | ICD-10-CM | POA: Diagnosis not present

## 2018-08-27 ENCOUNTER — Telehealth: Payer: Self-pay

## 2018-08-27 NOTE — Telephone Encounter (Signed)
Spoke with the pt,  She said both times in the past when she had diverticulitis, she had eaten either nuts or seeds, so she completely avoids all nuts or seeds. Pt said she remembers being told that in endo that she did not have any signs of diverticulosis after her tcs in October 2019.  She wants to know if she needs to continue to avoid all nuts and seeds or can she eat them now? She would also like to know if the diverticula could come back?  Pt said it was ok for me to call her back at her work number- [phone] Ext [phone]

## 2018-08-27 NOTE — Telephone Encounter (Signed)
Tried to call pt- office was closed for lunch.

## 2018-08-27 NOTE — Telephone Encounter (Signed)
Did not see any residual diverticula at colonoscopy.  I recommend a heart healthy high-fiber low-fat diet.  Everything else in moderation including seeds and nuts.  No need to completely exclude.  Would not ingest excessive amounts at any given time

## 2018-08-27 NOTE — Telephone Encounter (Signed)
Spoke with the pt and she is aware.

## 2018-08-29 DIAGNOSIS — S60562D Insect bite (nonvenomous) of left hand, subsequent encounter: Secondary | ICD-10-CM | POA: Diagnosis not present

## 2018-11-20 ENCOUNTER — Other Ambulatory Visit: Payer: Self-pay | Admitting: Family Medicine

## 2018-11-20 DIAGNOSIS — Z1231 Encounter for screening mammogram for malignant neoplasm of breast: Secondary | ICD-10-CM

## 2019-01-01 ENCOUNTER — Ambulatory Visit
Admission: RE | Admit: 2019-01-01 | Discharge: 2019-01-01 | Disposition: A | Discharge status: Home / Self Care | Payer: BC Managed Care – PPO | Source: Ambulatory Visit | Attending: Family Medicine | Admitting: Family Medicine

## 2019-01-01 ENCOUNTER — Other Ambulatory Visit: Payer: Self-pay

## 2019-01-01 DIAGNOSIS — Z1231 Encounter for screening mammogram for malignant neoplasm of breast: Secondary | ICD-10-CM | POA: Diagnosis not present

## 2019-02-13 DIAGNOSIS — G479 Sleep disorder, unspecified: Secondary | ICD-10-CM | POA: Diagnosis not present

## 2019-02-13 DIAGNOSIS — I10 Essential (primary) hypertension: Secondary | ICD-10-CM | POA: Diagnosis not present

## 2019-02-13 DIAGNOSIS — G43109 Migraine with aura, not intractable, without status migrainosus: Secondary | ICD-10-CM | POA: Diagnosis not present

## 2019-02-13 DIAGNOSIS — F418 Other specified anxiety disorders: Secondary | ICD-10-CM | POA: Diagnosis not present

## 2019-02-13 DIAGNOSIS — Z03818 Encounter for observation for suspected exposure to other biological agents ruled out: Secondary | ICD-10-CM | POA: Diagnosis not present

## 2019-02-13 DIAGNOSIS — Z0000 Encounter for general adult medical examination without abnormal findings: Secondary | ICD-10-CM | POA: Diagnosis not present

## 2019-03-05 DIAGNOSIS — E559 Vitamin D deficiency, unspecified: Secondary | ICD-10-CM | POA: Diagnosis not present

## 2019-03-05 DIAGNOSIS — I10 Essential (primary) hypertension: Secondary | ICD-10-CM | POA: Diagnosis not present

## 2019-03-05 DIAGNOSIS — L301 Dyshidrosis [pompholyx]: Secondary | ICD-10-CM | POA: Diagnosis not present

## 2019-03-05 DIAGNOSIS — Z79899 Other long term (current) drug therapy: Secondary | ICD-10-CM | POA: Diagnosis not present

## 2019-03-25 DIAGNOSIS — M25511 Pain in right shoulder: Secondary | ICD-10-CM | POA: Diagnosis not present

## 2019-04-17 ENCOUNTER — Other Ambulatory Visit: Payer: Self-pay

## 2019-04-17 DIAGNOSIS — I722 Aneurysm of renal artery: Secondary | ICD-10-CM

## 2019-04-30 ENCOUNTER — Other Ambulatory Visit: Payer: BC Managed Care – PPO

## 2019-05-06 ENCOUNTER — Ambulatory Visit: Payer: BC Managed Care – PPO | Admitting: Vascular Surgery

## 2019-05-12 ENCOUNTER — Encounter: Payer: Self-pay | Admitting: Radiology

## 2019-05-15 ENCOUNTER — Other Ambulatory Visit: Payer: Self-pay

## 2019-05-20 ENCOUNTER — Ambulatory Visit: Payer: Self-pay | Admitting: Vascular Surgery

## 2019-05-26 ENCOUNTER — Other Ambulatory Visit: Payer: Self-pay

## 2019-05-26 DIAGNOSIS — I722 Aneurysm of renal artery: Secondary | ICD-10-CM

## 2019-05-30 ENCOUNTER — Ambulatory Visit
Admission: RE | Admit: 2019-05-30 | Discharge: 2019-05-30 | Disposition: A | Discharge status: Home / Self Care | Payer: 59 | Source: Ambulatory Visit | Attending: Vascular Surgery | Admitting: Vascular Surgery

## 2019-05-30 DIAGNOSIS — I722 Aneurysm of renal artery: Secondary | ICD-10-CM

## 2019-05-30 MED ORDER — IOPAMIDOL (ISOVUE-370) INJECTION 76%
75.0000 mL | Freq: Once | INTRAVENOUS | Status: AC | PRN
  Administered 2019-05-30: 14:00:00 75 mL via INTRAVENOUS

## 2019-06-03 ENCOUNTER — Ambulatory Visit (INDEPENDENT_AMBULATORY_CARE_PROVIDER_SITE_OTHER): Payer: 59 | Admitting: Vascular Surgery

## 2019-06-03 ENCOUNTER — Other Ambulatory Visit: Payer: Self-pay

## 2019-06-03 ENCOUNTER — Encounter: Payer: Self-pay | Admitting: Vascular Surgery

## 2019-06-03 DIAGNOSIS — I722 Aneurysm of renal artery: Secondary | ICD-10-CM | POA: Diagnosis not present

## 2019-06-03 NOTE — Progress Notes (Signed)
Virtual Visit via Telephone Note    I connected with Sheila [name] on 06/03/2019 using the [email or web address] by telephone and verified that I was speaking with the correct person using two identifiers. Patient was located at home and accompanied by no one. I am located at [facility].   The limitations of evaluation and management by telemedicine and the availability of in person appointments have been previously discussed with the patient and are documented in the patients chart. The patient expressed understanding and consented to proceed.  PCP: Cari Caraway, MD   Chief Complaint: Follow-up asymptomatic left renal artery aneurysm  History of Present Illness: Sheila [name] is a 54 y.o. 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  This is been dating back to 2017.  She underwent repeat CT abdomen pelvis 2 days ago for follow-up.  She remains completely asymptomatic related to this.  Past Medical History:  Diagnosis Date  . Diverticulitis   . GERD (gastroesophageal reflux disease)   . Hypertension    not taking lisinopril now due to weight loss, PCP told her to hold for now  . Migraine headache    last one over 1 month ago.  [name] Pelvic abscess in female    see PSH    Past Surgical History:  Procedure Laterality Date  . CHOLECYSTECTOMY        . COLONOSCOPY  12/05/2010   left sided diverticulosis, melanosis coli. Next TCS at age 55. Procedure: COLONOSCOPY;  Surgeon: Daneil Dolin, MD;  [facility];  Service: Endoscopy;  Laterality: N/A;  8:15AM  . COLONOSCOPY N/A 01/18/2018   Procedure: COLONOSCOPY;  Surgeon: Daneil Dolin, MD;  [facility];  Service: Endoscopy;  Laterality: N/A;  2:00  . DILATION AND CURETTAGE OF UTERUS  02/06/2011   Procedure: DILATATION AND CURETTAGE (D&C);  Surgeon: Jonnie Kind, MD;  Location: [facility];  Service: Gynecology;  Laterality: N/A;  . LAPAROSCOPY  02/06/2011   Procedure: LAPAROSCOPY  DIAGNOSTIC;  Surgeon: Jonnie Kind, MD;  Location: [facility];  Service: Gynecology;  Laterality: N/A;  . PARTIAL COLECTOMY N/A 09/17/2015   Procedure: PARTIAL COLECTOMY, REPAIR OF BLADDER FISTULA;  Surgeon: Aviva Signs, MD;  Location: [facility];  Service: General;  Laterality: N/A;  . SALPINGOOPHORECTOMY  02/06/2011   Procedure: SALPINGO OOPHERECTOMY;  Surgeon: Jonnie Kind, MD;  Location: [facility];  Service: Gynecology;  Laterality: N/A;  procedure started at 1241  . TONSILLECTOMY     as child    Current Meds  Medication Sig  . Calcium Polycarbophil (FIBER-CAPS PO) Take 2 capsules by mouth 3 (three) times daily.   . Carboxymethylcellul-Glycerin (LUBRICATING EYE DROPS OP) Place 1 drop into both eyes daily as needed (dry eyes).  . Cholecalciferol (VITAMIN D3) 5000 units CAPS Take 5,000 Units by mouth daily.  . citalopram (CELEXA) 40 MG tablet Take 40 mg by mouth daily.  [name] ibuprofen (ADVIL,MOTRIN) 200 MG tablet Take 800 mg by mouth 2 (two) times daily as needed for headache.  . lisinopril (PRINIVIL,ZESTRIL) 10 MG tablet Take 10 mg by mouth daily.  [name] omeprazole (PRILOSEC OTC) 20 MG tablet Take 20 mg by mouth daily.  [name] OVER THE COUNTER MEDICATION Take 1,000 mg by mouth daily. Holy basil otc supplement  . Probiotic Product (PROBIOTIC DAILY PO) Take 1 capsule by mouth daily.  . rizatriptan (MAXALT) 10 MG tablet Take 10 mg by mouth as needed for migraine. May repeat in 2 hours if  needed    12 system ROS was negative unless otherwise noted in HPI   Observations/Objective: CT angiogram was reviewed.  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  She does have a hiatal hernia but no other findings  Assessment and Plan: I discussed these findings at length with the patient.  I did review symptoms of leaking renal artery aneurysm and her need to report immediately to the emergency room should this occur.  I explained this would be extremely unlikely with the small  size.  Follow Up Instructions:  Radiology suggested a possible 5-year follow-up.  I would feel more comfortable with 4 years since this is been the length of time since her initial diagnosis.  Will arrange for CT abdomen and pelvis for 4 years for continued follow-up and office visit at that time   I discussed the assessment and treatment plan with the patient. The patient was provided an opportunity to ask questions and all were answered. The patient agreed with the plan and demonstrated an understanding of the instructions.   The patient was advised to call back or seek an in-person evaluation if the symptoms worsen or if the condition fails to improve as anticipated.  I spent 5-10 minutes with the patient via telephone encounter.   Annamary Rummage [facility] [place] Office: [phone]  06/03/2019, 11:35 AM

## 2019-12-05 ENCOUNTER — Other Ambulatory Visit: Payer: Self-pay | Admitting: Family Medicine

## 2019-12-05 DIAGNOSIS — Z0000 Encounter for general adult medical examination without abnormal findings: Secondary | ICD-10-CM

## 2020-01-02 ENCOUNTER — Ambulatory Visit: Payer: 59

## 2020-02-05 ENCOUNTER — Ambulatory Visit
Admission: RE | Admit: 2020-02-05 | Discharge: 2020-02-05 | Disposition: A | Discharge status: Home / Self Care | Payer: Self-pay | Source: Ambulatory Visit | Attending: Family Medicine | Admitting: Family Medicine

## 2020-02-05 ENCOUNTER — Other Ambulatory Visit: Payer: Self-pay

## 2020-02-05 DIAGNOSIS — Z1231 Encounter for screening mammogram for malignant neoplasm of breast: Secondary | ICD-10-CM | POA: Diagnosis not present

## 2020-02-05 DIAGNOSIS — Z0000 Encounter for general adult medical examination without abnormal findings: Secondary | ICD-10-CM

## 2020-06-03 DIAGNOSIS — Z130 Encounter for screening for diseases of the blood and blood-forming organs and certain disorders involving the immune mechanism: Secondary | ICD-10-CM | POA: Diagnosis not present

## 2020-06-03 DIAGNOSIS — I10 Essential (primary) hypertension: Secondary | ICD-10-CM | POA: Diagnosis not present

## 2020-06-03 DIAGNOSIS — E559 Vitamin D deficiency, unspecified: Secondary | ICD-10-CM | POA: Diagnosis not present

## 2020-06-03 DIAGNOSIS — F418 Other specified anxiety disorders: Secondary | ICD-10-CM | POA: Diagnosis not present

## 2020-06-03 DIAGNOSIS — Z13220 Encounter for screening for lipoid disorders: Secondary | ICD-10-CM | POA: Diagnosis not present

## 2020-06-03 DIAGNOSIS — K219 Gastro-esophageal reflux disease without esophagitis: Secondary | ICD-10-CM | POA: Diagnosis not present

## 2020-06-03 DIAGNOSIS — G43109 Migraine with aura, not intractable, without status migrainosus: Secondary | ICD-10-CM | POA: Diagnosis not present

## 2020-06-03 DIAGNOSIS — Z124 Encounter for screening for malignant neoplasm of cervix: Secondary | ICD-10-CM | POA: Diagnosis not present

## 2020-06-03 DIAGNOSIS — Z0000 Encounter for general adult medical examination without abnormal findings: Secondary | ICD-10-CM | POA: Diagnosis not present

## 2020-06-03 DIAGNOSIS — Z131 Encounter for screening for diabetes mellitus: Secondary | ICD-10-CM | POA: Diagnosis not present

## 2020-09-22 DIAGNOSIS — M79671 Pain in right foot: Secondary | ICD-10-CM | POA: Diagnosis not present

## 2020-12-07 DIAGNOSIS — D225 Melanocytic nevi of trunk: Secondary | ICD-10-CM | POA: Diagnosis not present

## 2020-12-07 DIAGNOSIS — D0359 Melanoma in situ of other part of trunk: Secondary | ICD-10-CM | POA: Diagnosis not present

## 2020-12-07 DIAGNOSIS — Z1283 Encounter for screening for malignant neoplasm of skin: Secondary | ICD-10-CM | POA: Diagnosis not present

## 2020-12-20 DIAGNOSIS — D0359 Melanoma in situ of other part of trunk: Secondary | ICD-10-CM | POA: Diagnosis not present

## 2020-12-20 DIAGNOSIS — L988 Other specified disorders of the skin and subcutaneous tissue: Secondary | ICD-10-CM | POA: Diagnosis not present

## 2021-01-12 ENCOUNTER — Other Ambulatory Visit: Payer: Self-pay | Admitting: Family Medicine

## 2021-01-12 DIAGNOSIS — Z1231 Encounter for screening mammogram for malignant neoplasm of breast: Secondary | ICD-10-CM

## 2021-01-22 DIAGNOSIS — M7751 Other enthesopathy of right foot: Secondary | ICD-10-CM | POA: Diagnosis not present

## 2021-02-09 ENCOUNTER — Ambulatory Visit
Admission: RE | Admit: 2021-02-09 | Discharge: 2021-02-09 | Disposition: A | Discharge status: Home / Self Care | Payer: BC Managed Care – PPO | Source: Ambulatory Visit | Attending: Family Medicine | Admitting: Family Medicine

## 2021-02-09 DIAGNOSIS — Z1231 Encounter for screening mammogram for malignant neoplasm of breast: Secondary | ICD-10-CM

## 2021-03-29 DIAGNOSIS — D485 Neoplasm of uncertain behavior of skin: Secondary | ICD-10-CM | POA: Diagnosis not present

## 2021-03-29 DIAGNOSIS — Z08 Encounter for follow-up examination after completed treatment for malignant neoplasm: Secondary | ICD-10-CM | POA: Diagnosis not present

## 2021-03-29 DIAGNOSIS — D225 Melanocytic nevi of trunk: Secondary | ICD-10-CM | POA: Diagnosis not present

## 2021-03-29 DIAGNOSIS — Z1283 Encounter for screening for malignant neoplasm of skin: Secondary | ICD-10-CM | POA: Diagnosis not present

## 2021-03-29 DIAGNOSIS — Z85820 Personal history of malignant melanoma of skin: Secondary | ICD-10-CM | POA: Diagnosis not present

## 2021-06-09 DIAGNOSIS — I10 Essential (primary) hypertension: Secondary | ICD-10-CM | POA: Diagnosis not present

## 2021-06-09 DIAGNOSIS — Z0000 Encounter for general adult medical examination without abnormal findings: Secondary | ICD-10-CM | POA: Diagnosis not present

## 2021-06-09 DIAGNOSIS — Z23 Encounter for immunization: Secondary | ICD-10-CM | POA: Diagnosis not present

## 2021-06-09 DIAGNOSIS — Z79899 Other long term (current) drug therapy: Secondary | ICD-10-CM | POA: Diagnosis not present

## 2021-06-09 DIAGNOSIS — K219 Gastro-esophageal reflux disease without esophagitis: Secondary | ICD-10-CM | POA: Diagnosis not present

## 2021-06-09 DIAGNOSIS — F418 Other specified anxiety disorders: Secondary | ICD-10-CM | POA: Diagnosis not present

## 2021-06-09 DIAGNOSIS — E559 Vitamin D deficiency, unspecified: Secondary | ICD-10-CM | POA: Diagnosis not present

## 2021-06-09 DIAGNOSIS — E669 Obesity, unspecified: Secondary | ICD-10-CM | POA: Diagnosis not present

## 2021-07-12 DIAGNOSIS — Z85820 Personal history of malignant melanoma of skin: Secondary | ICD-10-CM | POA: Diagnosis not present

## 2021-07-12 DIAGNOSIS — D225 Melanocytic nevi of trunk: Secondary | ICD-10-CM | POA: Diagnosis not present

## 2021-07-12 DIAGNOSIS — D2371 Other benign neoplasm of skin of right lower limb, including hip: Secondary | ICD-10-CM | POA: Diagnosis not present

## 2021-07-12 DIAGNOSIS — L7211 Pilar cyst: Secondary | ICD-10-CM | POA: Diagnosis not present

## 2021-07-12 DIAGNOSIS — Z08 Encounter for follow-up examination after completed treatment for malignant neoplasm: Secondary | ICD-10-CM | POA: Diagnosis not present

## 2021-07-12 DIAGNOSIS — Z1283 Encounter for screening for malignant neoplasm of skin: Secondary | ICD-10-CM | POA: Diagnosis not present

## 2021-07-13 DIAGNOSIS — Z23 Encounter for immunization: Secondary | ICD-10-CM | POA: Diagnosis not present

## 2021-08-18 DIAGNOSIS — M79671 Pain in right foot: Secondary | ICD-10-CM | POA: Diagnosis not present

## 2021-08-18 DIAGNOSIS — M7661 Achilles tendinitis, right leg: Secondary | ICD-10-CM | POA: Diagnosis not present

## 2021-09-10 DIAGNOSIS — H66002 Acute suppurative otitis media without spontaneous rupture of ear drum, left ear: Secondary | ICD-10-CM | POA: Diagnosis not present

## 2021-09-10 DIAGNOSIS — J018 Other acute sinusitis: Secondary | ICD-10-CM | POA: Diagnosis not present

## 2021-09-10 DIAGNOSIS — J209 Acute bronchitis, unspecified: Secondary | ICD-10-CM | POA: Diagnosis not present

## 2021-09-10 DIAGNOSIS — B379 Candidiasis, unspecified: Secondary | ICD-10-CM | POA: Diagnosis not present

## 2021-09-29 DIAGNOSIS — I10 Essential (primary) hypertension: Secondary | ICD-10-CM | POA: Diagnosis not present

## 2021-09-29 DIAGNOSIS — E669 Obesity, unspecified: Secondary | ICD-10-CM | POA: Diagnosis not present

## 2021-09-29 DIAGNOSIS — F418 Other specified anxiety disorders: Secondary | ICD-10-CM | POA: Diagnosis not present

## 2021-12-14 DIAGNOSIS — Z08 Encounter for follow-up examination after completed treatment for malignant neoplasm: Secondary | ICD-10-CM | POA: Diagnosis not present

## 2021-12-14 DIAGNOSIS — D2271 Melanocytic nevi of right lower limb, including hip: Secondary | ICD-10-CM | POA: Diagnosis not present

## 2021-12-14 DIAGNOSIS — Z1283 Encounter for screening for malignant neoplasm of skin: Secondary | ICD-10-CM | POA: Diagnosis not present

## 2021-12-14 DIAGNOSIS — D225 Melanocytic nevi of trunk: Secondary | ICD-10-CM | POA: Diagnosis not present

## 2021-12-14 DIAGNOSIS — D485 Neoplasm of uncertain behavior of skin: Secondary | ICD-10-CM | POA: Diagnosis not present

## 2021-12-14 DIAGNOSIS — Z85820 Personal history of malignant melanoma of skin: Secondary | ICD-10-CM | POA: Diagnosis not present

## 2022-01-13 DIAGNOSIS — Z23 Encounter for immunization: Secondary | ICD-10-CM | POA: Diagnosis not present

## 2022-03-27 ENCOUNTER — Other Ambulatory Visit: Payer: Self-pay | Admitting: Family Medicine

## 2022-03-27 DIAGNOSIS — Z1231 Encounter for screening mammogram for malignant neoplasm of breast: Secondary | ICD-10-CM

## 2022-04-06 DIAGNOSIS — J069 Acute upper respiratory infection, unspecified: Secondary | ICD-10-CM | POA: Diagnosis not present

## 2022-04-06 DIAGNOSIS — R0989 Other specified symptoms and signs involving the circulatory and respiratory systems: Secondary | ICD-10-CM | POA: Diagnosis not present

## 2022-04-25 DIAGNOSIS — Z08 Encounter for follow-up examination after completed treatment for malignant neoplasm: Secondary | ICD-10-CM | POA: Diagnosis not present

## 2022-04-25 DIAGNOSIS — D225 Melanocytic nevi of trunk: Secondary | ICD-10-CM | POA: Diagnosis not present

## 2022-04-25 DIAGNOSIS — Z85820 Personal history of malignant melanoma of skin: Secondary | ICD-10-CM | POA: Diagnosis not present

## 2022-04-25 DIAGNOSIS — Z1283 Encounter for screening for malignant neoplasm of skin: Secondary | ICD-10-CM | POA: Diagnosis not present

## 2022-05-01 ENCOUNTER — Ambulatory Visit
Admission: RE | Admit: 2022-05-01 | Discharge: 2022-05-01 | Disposition: A | Discharge status: Home / Self Care | Payer: BC Managed Care – PPO | Source: Ambulatory Visit | Attending: Family Medicine | Admitting: Family Medicine

## 2022-05-01 DIAGNOSIS — Z1231 Encounter for screening mammogram for malignant neoplasm of breast: Secondary | ICD-10-CM

## 2022-06-12 DIAGNOSIS — E559 Vitamin D deficiency, unspecified: Secondary | ICD-10-CM | POA: Diagnosis not present

## 2022-06-12 DIAGNOSIS — I10 Essential (primary) hypertension: Secondary | ICD-10-CM | POA: Diagnosis not present

## 2022-06-12 DIAGNOSIS — Z79899 Other long term (current) drug therapy: Secondary | ICD-10-CM | POA: Diagnosis not present

## 2022-06-15 DIAGNOSIS — Z23 Encounter for immunization: Secondary | ICD-10-CM | POA: Diagnosis not present

## 2022-06-15 DIAGNOSIS — G479 Sleep disorder, unspecified: Secondary | ICD-10-CM | POA: Diagnosis not present

## 2022-06-15 DIAGNOSIS — I10 Essential (primary) hypertension: Secondary | ICD-10-CM | POA: Diagnosis not present

## 2022-06-15 DIAGNOSIS — F418 Other specified anxiety disorders: Secondary | ICD-10-CM | POA: Diagnosis not present

## 2022-06-15 DIAGNOSIS — G43109 Migraine with aura, not intractable, without status migrainosus: Secondary | ICD-10-CM | POA: Diagnosis not present

## 2022-06-15 DIAGNOSIS — Z0000 Encounter for general adult medical examination without abnormal findings: Secondary | ICD-10-CM | POA: Diagnosis not present

## 2022-11-15 DIAGNOSIS — Z1283 Encounter for screening for malignant neoplasm of skin: Secondary | ICD-10-CM | POA: Diagnosis not present

## 2022-11-15 DIAGNOSIS — Z85820 Personal history of malignant melanoma of skin: Secondary | ICD-10-CM | POA: Diagnosis not present

## 2022-11-15 DIAGNOSIS — Z08 Encounter for follow-up examination after completed treatment for malignant neoplasm: Secondary | ICD-10-CM | POA: Diagnosis not present

## 2022-11-15 DIAGNOSIS — D225 Melanocytic nevi of trunk: Secondary | ICD-10-CM | POA: Diagnosis not present

## 2023-04-16 ENCOUNTER — Other Ambulatory Visit: Payer: Self-pay | Admitting: Family Medicine

## 2023-04-16 DIAGNOSIS — Z1231 Encounter for screening mammogram for malignant neoplasm of breast: Secondary | ICD-10-CM

## 2023-05-03 ENCOUNTER — Ambulatory Visit
Admission: RE | Admit: 2023-05-03 | Discharge: 2023-05-03 | Disposition: A | Discharge status: Home / Self Care | Payer: BC Managed Care – PPO | Source: Ambulatory Visit | Attending: Family Medicine | Admitting: Family Medicine

## 2023-05-03 DIAGNOSIS — Z1231 Encounter for screening mammogram for malignant neoplasm of breast: Secondary | ICD-10-CM

## 2023-06-26 DIAGNOSIS — D225 Melanocytic nevi of trunk: Secondary | ICD-10-CM | POA: Diagnosis not present

## 2023-06-26 DIAGNOSIS — Z85820 Personal history of malignant melanoma of skin: Secondary | ICD-10-CM | POA: Diagnosis not present

## 2023-06-26 DIAGNOSIS — Z08 Encounter for follow-up examination after completed treatment for malignant neoplasm: Secondary | ICD-10-CM | POA: Diagnosis not present

## 2023-06-26 DIAGNOSIS — Z1283 Encounter for screening for malignant neoplasm of skin: Secondary | ICD-10-CM | POA: Diagnosis not present

## 2023-06-28 DIAGNOSIS — Z0000 Encounter for general adult medical examination without abnormal findings: Secondary | ICD-10-CM | POA: Diagnosis not present

## 2023-06-28 DIAGNOSIS — K219 Gastro-esophageal reflux disease without esophagitis: Secondary | ICD-10-CM | POA: Diagnosis not present

## 2023-06-28 DIAGNOSIS — Z79899 Other long term (current) drug therapy: Secondary | ICD-10-CM | POA: Diagnosis not present

## 2023-06-28 DIAGNOSIS — R233 Spontaneous ecchymoses: Secondary | ICD-10-CM | POA: Diagnosis not present

## 2023-06-28 DIAGNOSIS — F418 Other specified anxiety disorders: Secondary | ICD-10-CM | POA: Diagnosis not present

## 2023-06-28 DIAGNOSIS — Z13220 Encounter for screening for lipoid disorders: Secondary | ICD-10-CM | POA: Diagnosis not present

## 2023-06-28 DIAGNOSIS — I10 Essential (primary) hypertension: Secondary | ICD-10-CM | POA: Diagnosis not present

## 2023-06-28 DIAGNOSIS — G43109 Migraine with aura, not intractable, without status migrainosus: Secondary | ICD-10-CM | POA: Diagnosis not present

## 2023-09-10 ENCOUNTER — Telehealth: Payer: Self-pay | Admitting: Family Medicine

## 2023-09-10 NOTE — Telephone Encounter (Signed)
 Pt can schedule next available apt with provider accepting new patients.  Copied from CRM [id number]. Topic: Appointments - Transfer of Care >> Sep 10, 2023  2:10 PM Star [place] wrote: Pt is requesting to transfer FROM: Dr Alphonzo Jenkins Pt is requesting to transfer TO: Evalyn Hillier Reason for requested transfer: Dr Alphonzo Jenkins is transferring It is the responsibility of the team the patient would like to transfer to [facility]) to reach out to the patient if for any reason this transfer is not acceptable.

## 2023-09-12 DIAGNOSIS — R7989 Other specified abnormal findings of blood chemistry: Secondary | ICD-10-CM | POA: Diagnosis not present

## 2023-09-12 DIAGNOSIS — I10 Essential (primary) hypertension: Secondary | ICD-10-CM | POA: Diagnosis not present

## 2024-01-04 DIAGNOSIS — F418 Other specified anxiety disorders: Secondary | ICD-10-CM | POA: Diagnosis not present

## 2024-01-04 DIAGNOSIS — Z23 Encounter for immunization: Secondary | ICD-10-CM | POA: Diagnosis not present

## 2024-01-04 DIAGNOSIS — K760 Fatty (change of) liver, not elsewhere classified: Secondary | ICD-10-CM | POA: Diagnosis not present

## 2024-01-04 DIAGNOSIS — E663 Overweight: Secondary | ICD-10-CM | POA: Diagnosis not present

## 2024-01-15 ENCOUNTER — Ambulatory Visit: Payer: Self-pay | Admitting: Family Medicine

## 2024-02-12 DIAGNOSIS — F418 Other specified anxiety disorders: Secondary | ICD-10-CM | POA: Diagnosis not present

## 2024-02-12 DIAGNOSIS — Z6827 Body mass index (BMI) 27.0-27.9, adult: Secondary | ICD-10-CM | POA: Diagnosis not present

## 2024-02-12 DIAGNOSIS — E669 Obesity, unspecified: Secondary | ICD-10-CM | POA: Diagnosis not present

## 2024-04-14 ENCOUNTER — Other Ambulatory Visit: Payer: Self-pay | Admitting: Family Medicine

## 2024-04-14 DIAGNOSIS — Z1231 Encounter for screening mammogram for malignant neoplasm of breast: Secondary | ICD-10-CM

## 2024-05-07 ENCOUNTER — Ambulatory Visit
Admission: RE | Admit: 2024-05-07 | Discharge: 2024-05-07 | Disposition: A | Source: Ambulatory Visit | Attending: Family Medicine | Admitting: Family Medicine

## 2024-05-07 DIAGNOSIS — Z1231 Encounter for screening mammogram for malignant neoplasm of breast: Secondary | ICD-10-CM
